# Patient Record
Sex: Female | Born: 1950 | Hispanic: Yes | Marital: Married | Smoking: Never smoker
Health system: Southern US, Community
[De-identification: ages and names within clinical notes are randomized; demographics above are authoritative.]

## PROBLEM LIST (undated history)

## (undated) DIAGNOSIS — F329 Major depressive disorder, single episode, unspecified: Secondary | ICD-10-CM

## (undated) DIAGNOSIS — F32A Depression, unspecified: Secondary | ICD-10-CM

## (undated) HISTORY — PX: ABDOMINAL HYSTERECTOMY: SHX81

## (undated) HISTORY — DX: Depression, unspecified: F32.A

## (undated) HISTORY — PX: THYROIDECTOMY: SHX17

## (undated) HISTORY — DX: Major depressive disorder, single episode, unspecified: F32.9

## (undated) HISTORY — PX: APPENDECTOMY: SHX54

---

## 2011-05-09 ENCOUNTER — Emergency Department (HOSPITAL_COMMUNITY): Payer: BC Managed Care – PPO

## 2011-05-09 ENCOUNTER — Emergency Department (HOSPITAL_COMMUNITY)
Admission: EM | Admit: 2011-05-09 | Discharge: 2011-05-10 | Disposition: A | Discharge status: Home / Self Care | Payer: BC Managed Care – PPO | Attending: Emergency Medicine | Admitting: Emergency Medicine

## 2011-05-09 DIAGNOSIS — I10 Essential (primary) hypertension: Secondary | ICD-10-CM | POA: Insufficient documentation

## 2011-05-09 DIAGNOSIS — R079 Chest pain, unspecified: Secondary | ICD-10-CM | POA: Insufficient documentation

## 2011-05-09 DIAGNOSIS — E119 Type 2 diabetes mellitus without complications: Secondary | ICD-10-CM | POA: Insufficient documentation

## 2011-05-09 DIAGNOSIS — R112 Nausea with vomiting, unspecified: Secondary | ICD-10-CM | POA: Insufficient documentation

## 2011-05-09 DIAGNOSIS — R109 Unspecified abdominal pain: Secondary | ICD-10-CM | POA: Insufficient documentation

## 2011-05-09 LAB — CBC
HCT: 44.2 % (ref 36.0–46.0)
Hemoglobin: 15.4 g/dL — ABNORMAL HIGH (ref 12.0–15.0)
MCV: 89.3 fL (ref 78.0–100.0)
RDW: 12.4 % (ref 11.5–15.5)
WBC: 14.3 10*3/uL — ABNORMAL HIGH (ref 4.0–10.5)

## 2011-05-09 LAB — DIFFERENTIAL
Basophils Absolute: 0 10*3/uL (ref 0.0–0.1)
Basophils Relative: 0 % (ref 0–1)
Eosinophils Absolute: 0 10*3/uL (ref 0.0–0.7)
Eosinophils Relative: 0 % (ref 0–5)
Lymphocytes Relative: 3 % — ABNORMAL LOW (ref 12–46)
Lymphs Abs: 0.4 10*3/uL — ABNORMAL LOW (ref 0.7–4.0)
Monocytes Absolute: 0.5 10*3/uL (ref 0.1–1.0)
Monocytes Relative: 3 % (ref 3–12)
Neutro Abs: 13.5 10*3/uL — ABNORMAL HIGH (ref 1.7–7.7)
Neutrophils Relative %: 94 % — ABNORMAL HIGH (ref 43–77)

## 2011-05-09 IMAGING — CR DG CHEST 2V
2 series · 2 of 2 positions shown · non-contrast
Comparison: None.

CLINICAL DATA: Chest pain.  History of diabetes.

CHEST - 2 VIEW

[w chest pa]
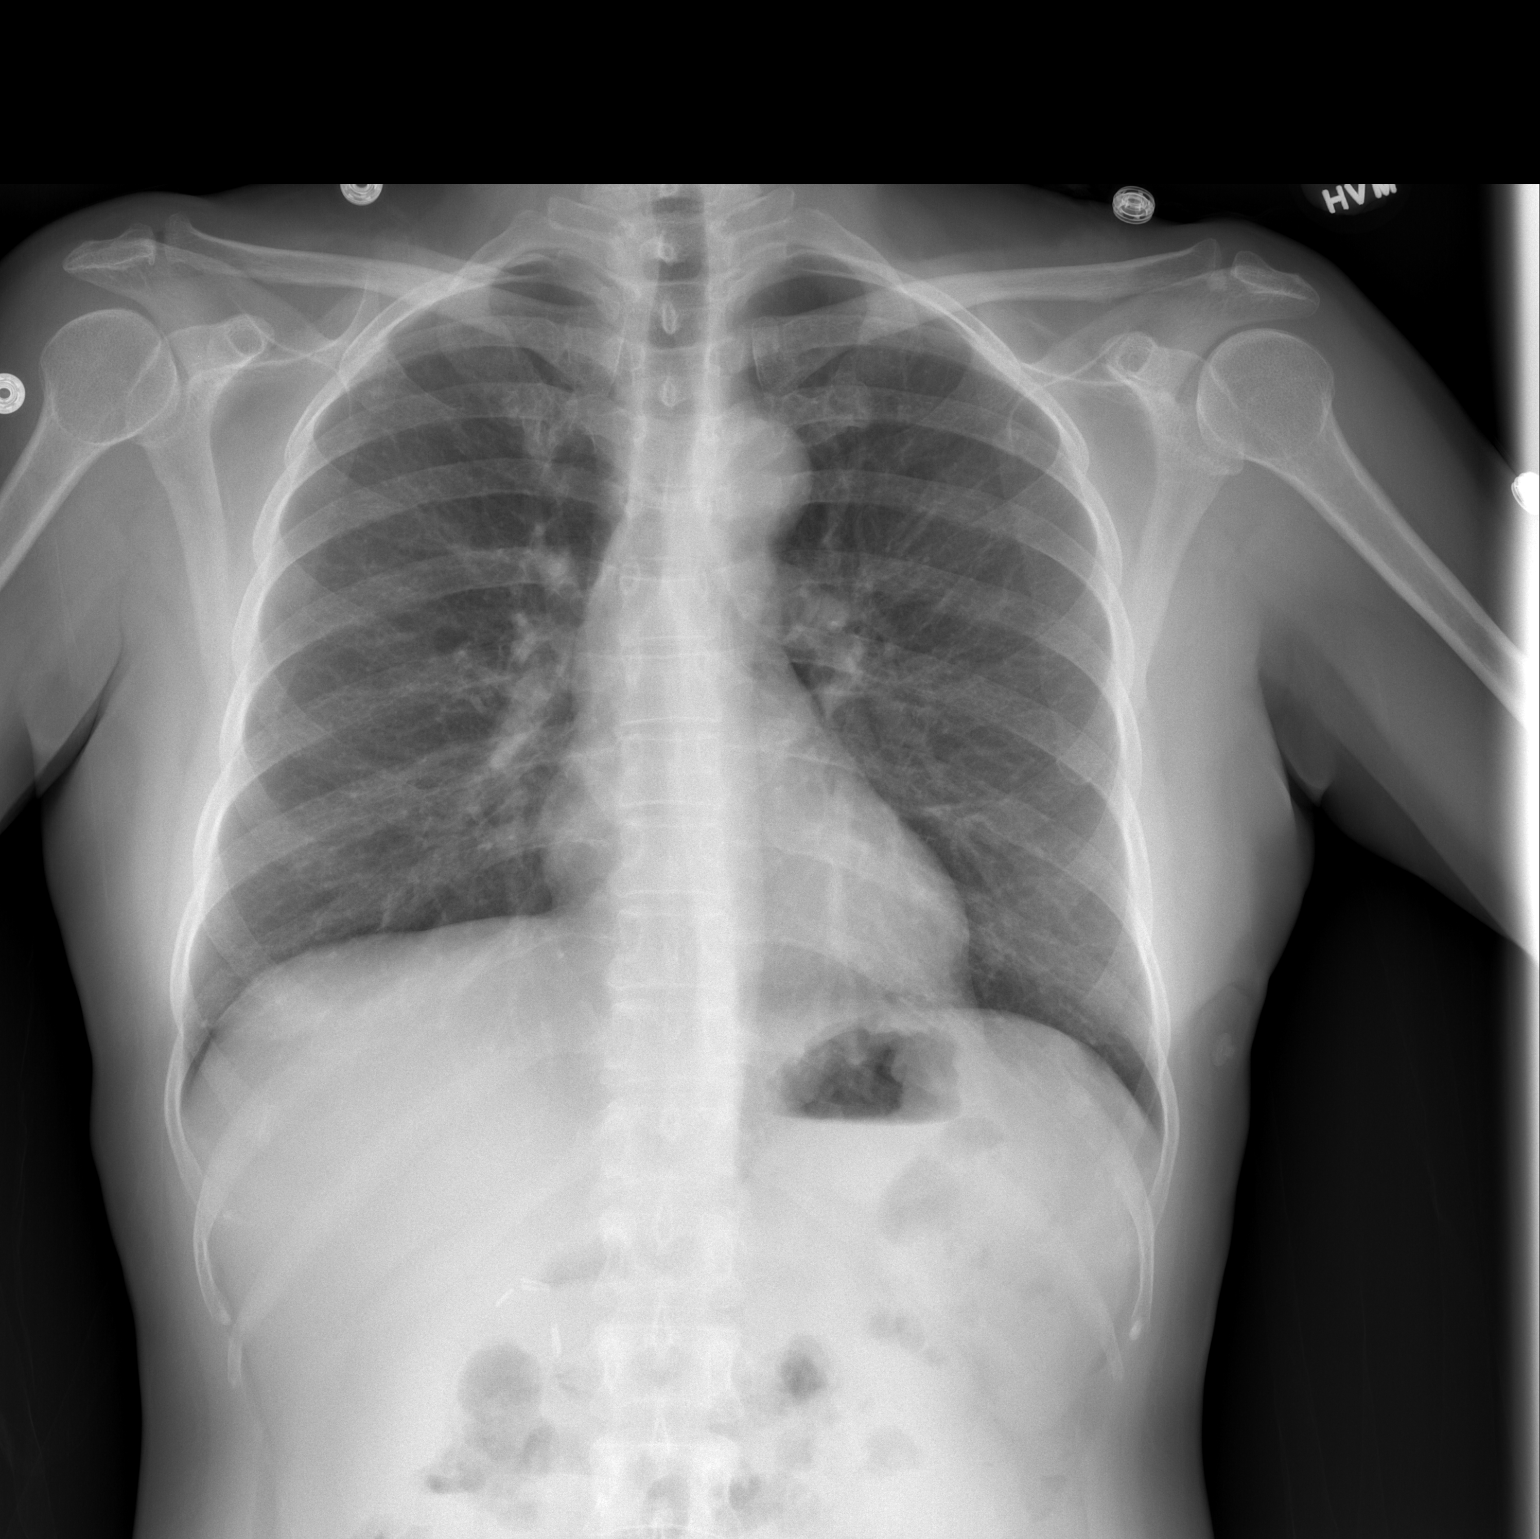

[w chest lat]
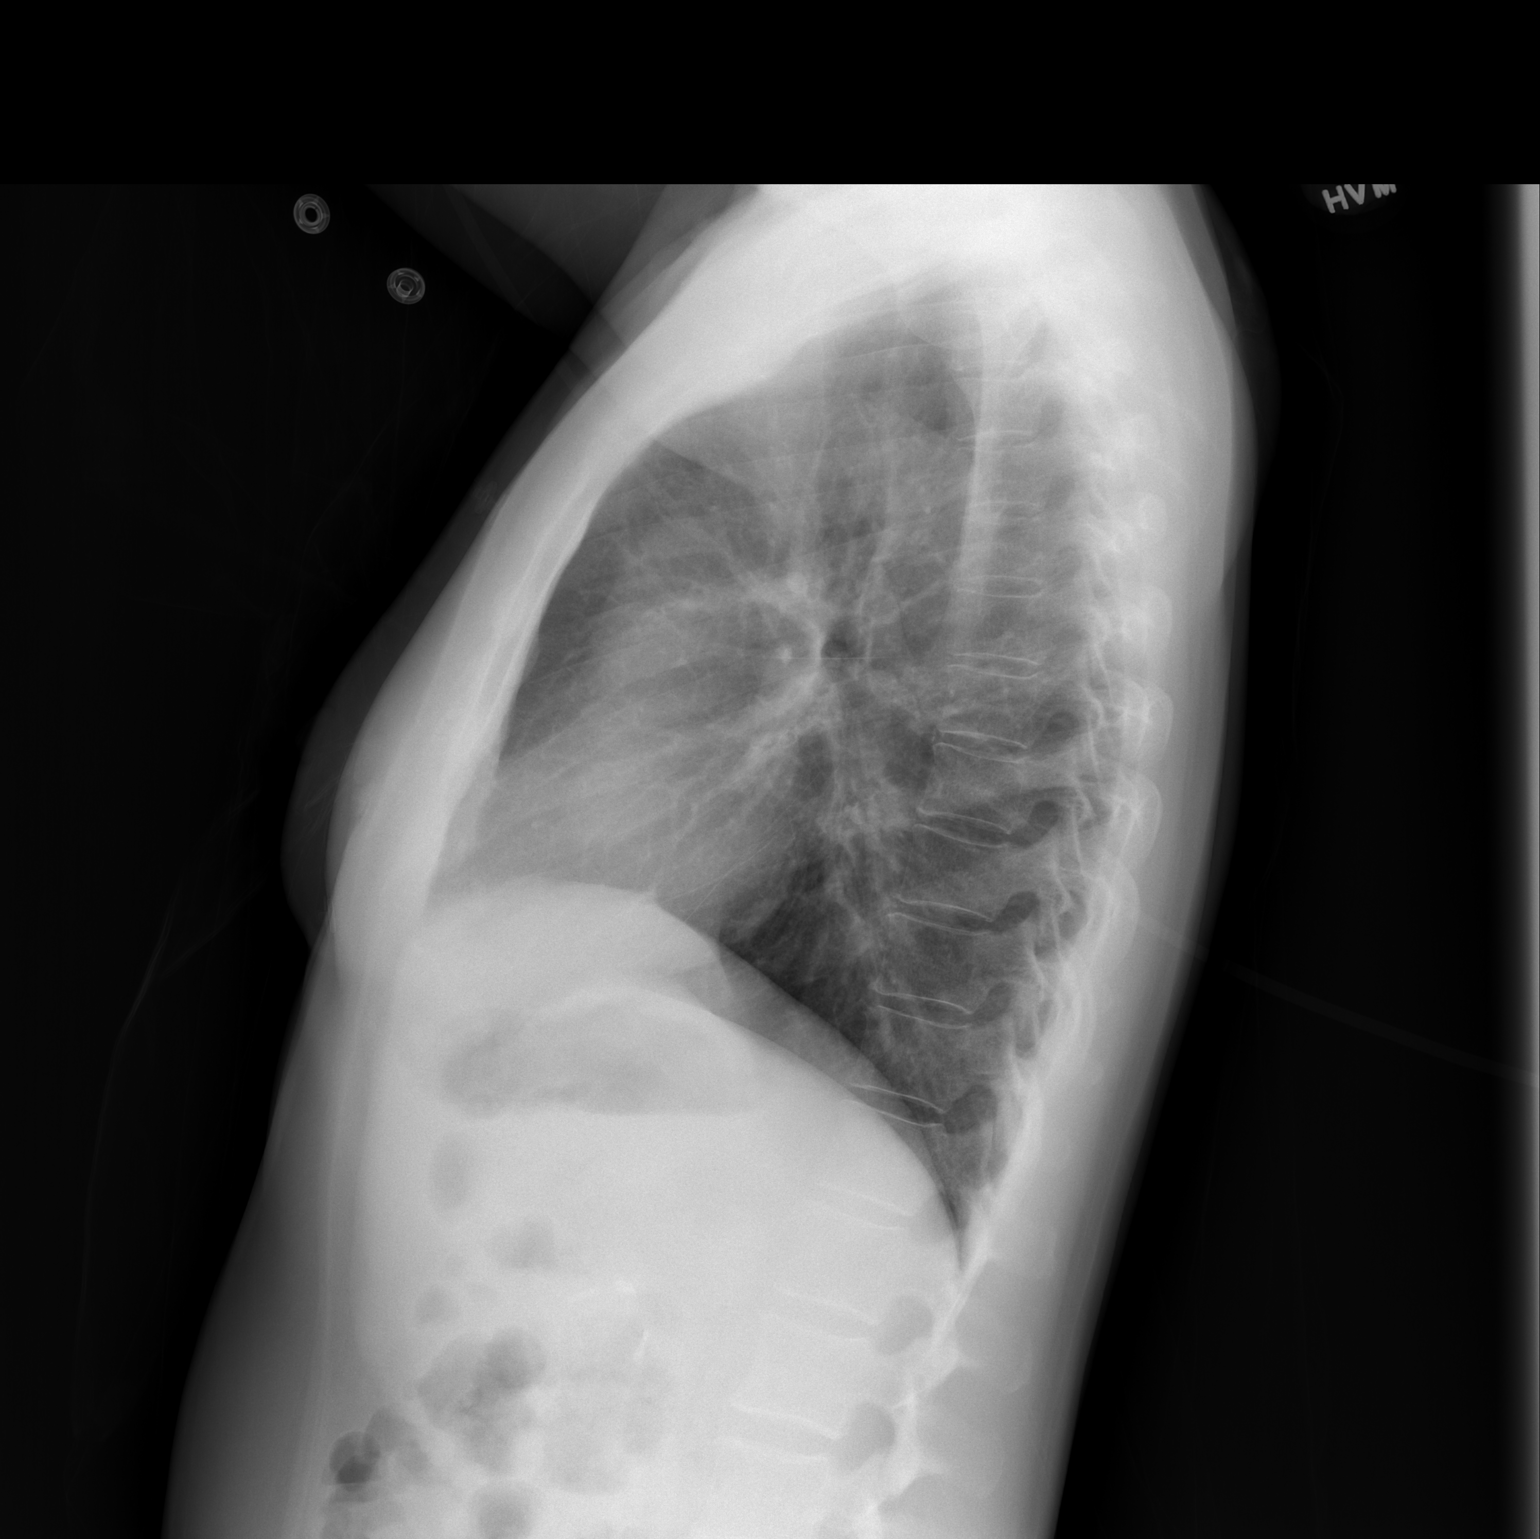

[2 of 2 positions shown; findings below may reference images not displayed]

FINDINGS: The lungs are well-aerated and clear.  There is no
evidence of focal opacification, pleural effusion or pneumothorax.

The heart is normal in size; the mediastinal contour is within
normal limits.  No acute osseous abnormalities are seen.  Clips are
noted within the right upper quadrant, reflecting prior
cholecystectomy.
IMPRESSION: No acute cardiopulmonary process seen.

## 2011-05-10 ENCOUNTER — Emergency Department (HOSPITAL_COMMUNITY): Payer: BC Managed Care – PPO

## 2011-05-10 LAB — COMPREHENSIVE METABOLIC PANEL
ALT: 18 U/L (ref 0–35)
AST: 19 U/L (ref 0–37)
Albumin: 4.3 g/dL (ref 3.5–5.2)
Alkaline Phosphatase: 94 U/L (ref 39–117)
Calcium: 9 mg/dL (ref 8.4–10.5)
GFR calc Af Amer: >60 mL/min (ref >60)
Glucose, Bld: 225 mg/dL — ABNORMAL HIGH (ref 70–99)
Potassium: 3.8 mEq/L (ref 3.5–5.1)
Sodium: 135 mEq/L (ref 135–145)
Total Protein: 7.8 g/dL (ref 6.0–8.3)

## 2011-05-10 LAB — URINALYSIS, ROUTINE W REFLEX MICROSCOPIC
Bilirubin Urine: NEGATIVE
Glucose, UA: NEGATIVE mg/dL
Hgb urine dipstick: NEGATIVE
Specific Gravity, Urine: 1.026 (ref 1.005–1.030)
Urobilinogen, UA: 1 mg/dL (ref 0.0–1.0)
pH: 6.5 (ref 5.0–8.0)

## 2011-05-10 LAB — CK TOTAL AND CKMB (NOT AT ARMC)
CK, MB: 1.3 ng/mL (ref 0.3–4.0)
Total CK: 76 U/L (ref 7–177)

## 2011-05-10 IMAGING — CT CT ABD-PELV W/ CM
3 of 6 series · 11 of 46 positions shown, 18 images · IV contrast (APPLIED)
Comparison: None.

CLINICAL DATA: Epigastric abdominal pain, nausea and bilious
vomiting.  Leukocytosis.

CT ABDOMEN AND PELVIS WITH CONTRAST
TECHNIQUE: Multidetector CT imaging of the abdomen and pelvis was
performed following the standard protocol during bolus
administration of intravenous contrast.
Contrast: 100 mL of Omnipaque 300 IV contrast

[Series 2: abd_pel 5.0 b40f st · axial · 0.66mm/px · z∈[-413,-73]mm · 7 of 92 slices shown, 12 images]
[im 12/92  soft-tissue]
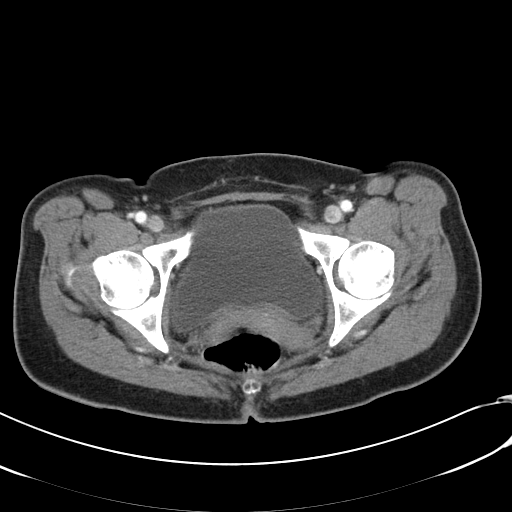
[im 12/92  bone]
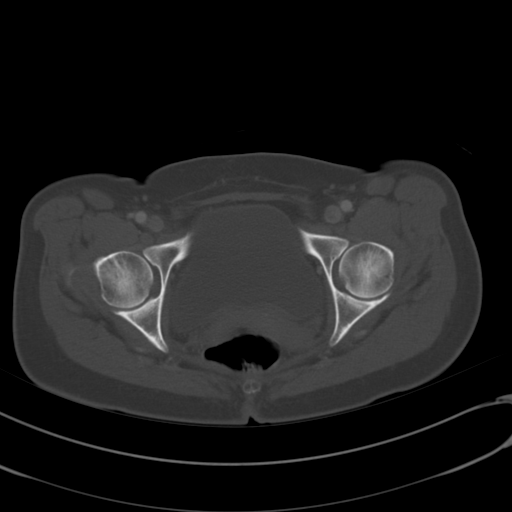
[im 23/92  soft-tissue]
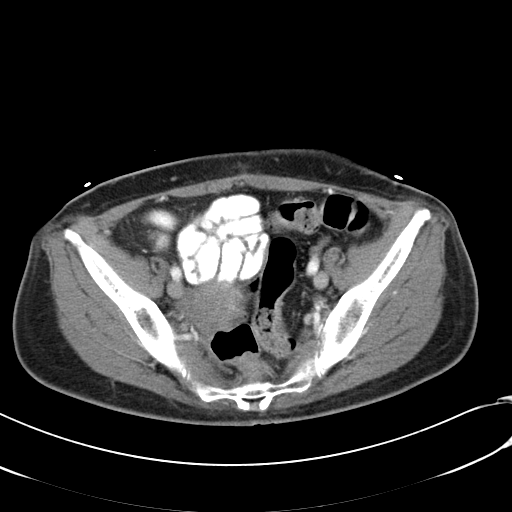
[im 35/92  soft-tissue]
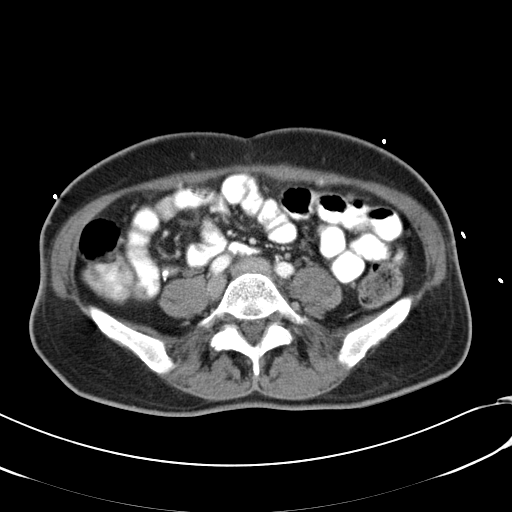
[im 46/92  soft-tissue]
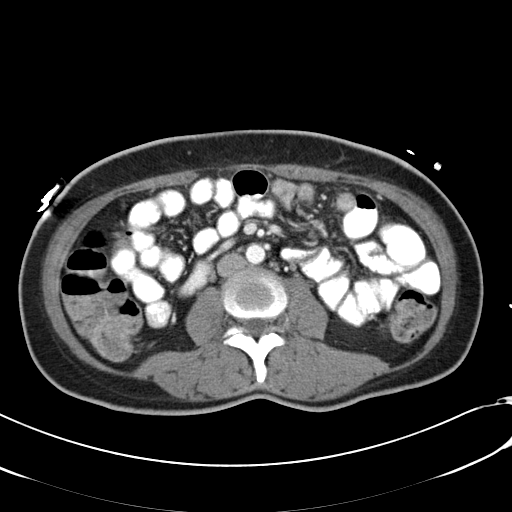
[im 46/92  lung]
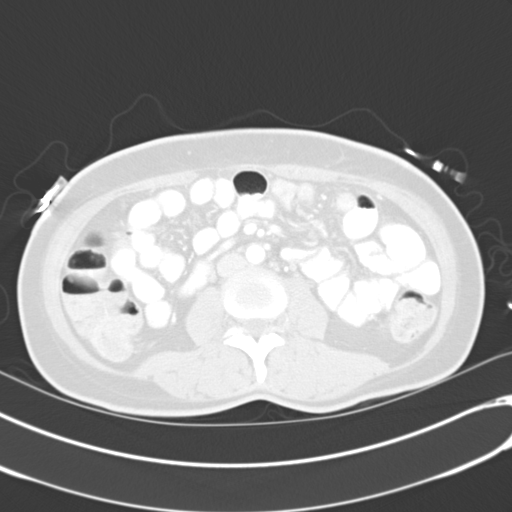
[im 57/92  soft-tissue]
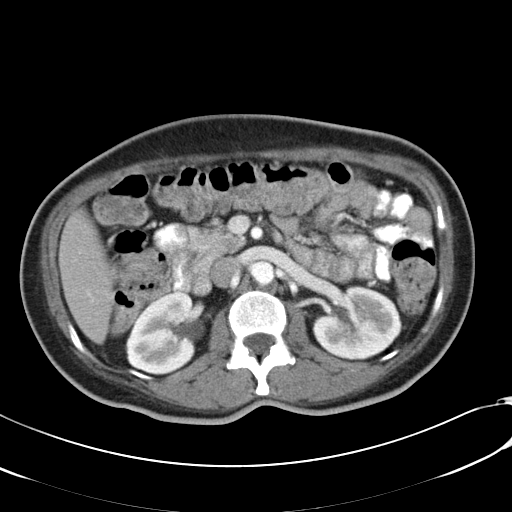
[im 57/92  lung]
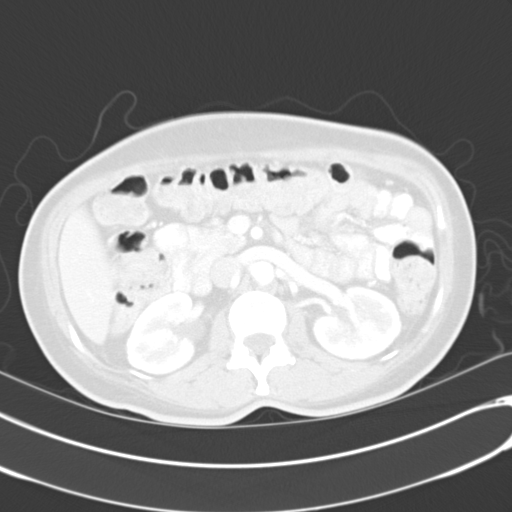
[im 69/92  soft-tissue]
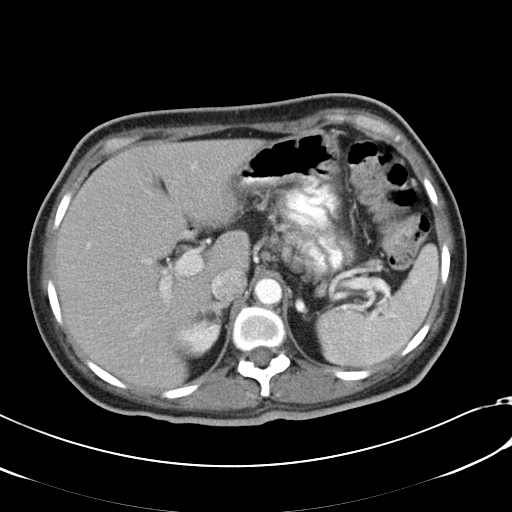
[im 69/92  lung]
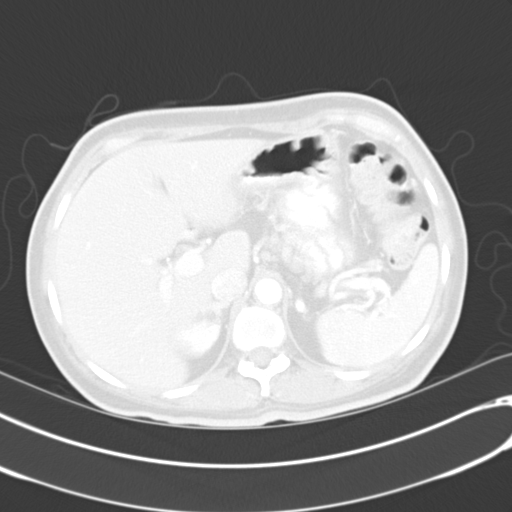
[im 80/92  soft-tissue]
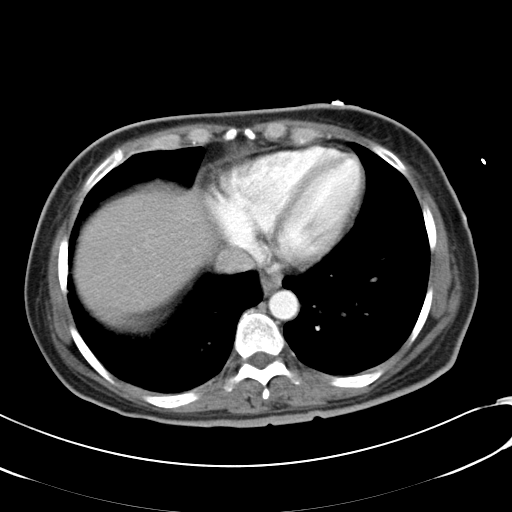
[im 80/92  lung]
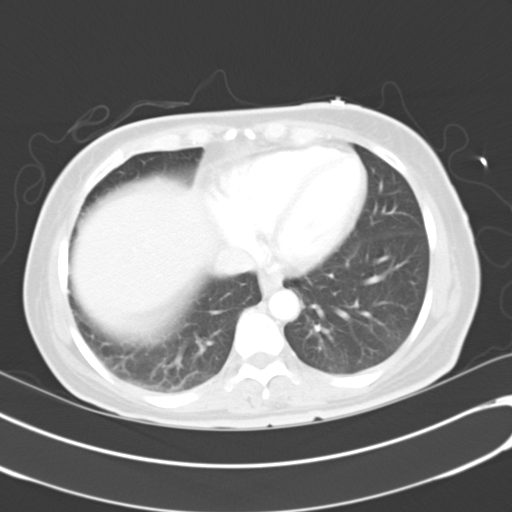

[Series 602: coronal abdomen · coronal · 0.93mm/px · 3 of 106 slices shown, 4 images]
[im 36/106  soft-tissue]
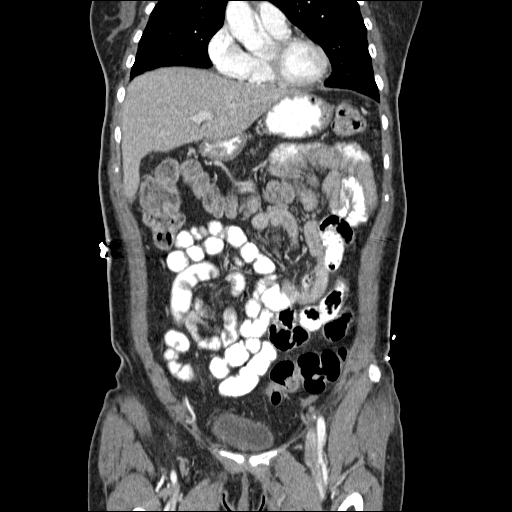
[im 47/106  soft-tissue]
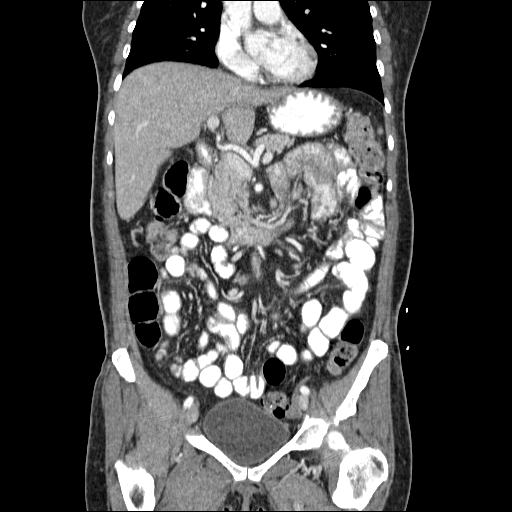
[im 47/106  bone]
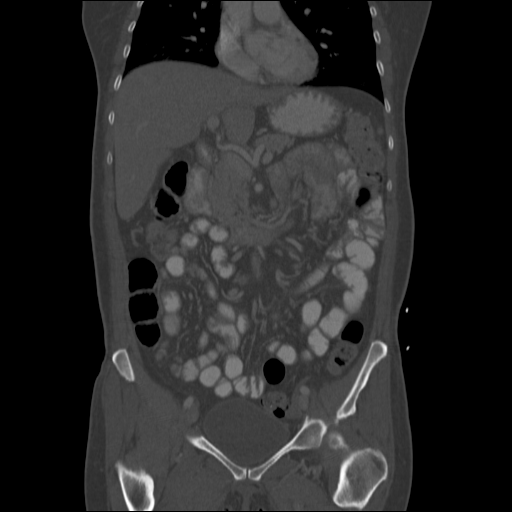
[im 59/106  soft-tissue]
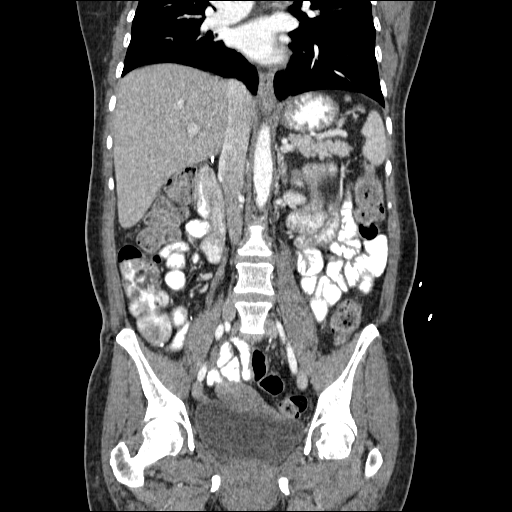

[Series 603: sagittal abdomen · sagittal · 0.93mm/px · 1 of 148 slices shown, 2 images]
[im 50/148  soft-tissue]
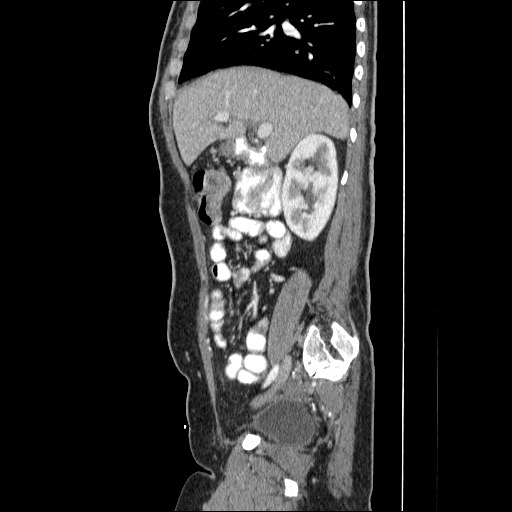
[im 50/148  bone]
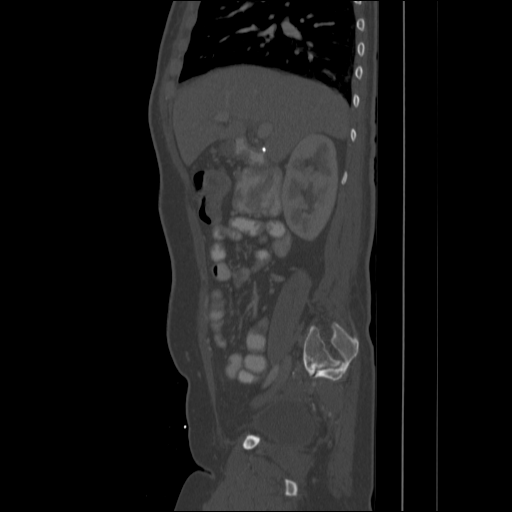

[11 of 46 positions shown; findings below may reference images not displayed]

FINDINGS: Minimal right basilar atelectasis is noted.

Minimal prominence of the hepatic biliary ducts is within normal
limits status post cholecystectomy.  Clips are noted along the
gallbladder fossa.  The liver is unremarkable in appearance.  The
spleen is within normal limits.

The pancreas and adrenal glands are unremarkable.  The kidneys are
within normal limits bilaterally; no hydronephrosis or perinephric
stranding is seen.

No free fluid is identified.  The small bowel is unremarkable in
appearance.  The stomach is partially filled with contrast and is
within normal limits.  No acute vascular abnormalities are seen.

The patient is status post appendectomy, with associated
postoperative change.  The colon is largely filled with stool and
is unremarkable in appearance.

The bladder is moderately distended and appears grossly
unremarkable.  The uterus is within normal limits.  The ovaries are
difficult to fully characterize; no suspicious adnexal masses are
seen.  No inguinal lymphadenopathy is seen.

No acute osseous abnormalities are identified.
IMPRESSION: No acute abnormalities identified within the abdomen or pelvis.

## 2011-05-10 MED ORDER — IOHEXOL 300 MG/ML  SOLN
100.0000 mL | Freq: Once | INTRAMUSCULAR | Status: AC | PRN
  Administered 2011-05-10: 100 mL via INTRAVENOUS

## 2012-01-29 DIAGNOSIS — E119 Type 2 diabetes mellitus without complications: Secondary | ICD-10-CM | POA: Insufficient documentation

## 2013-06-02 DIAGNOSIS — I10 Essential (primary) hypertension: Secondary | ICD-10-CM | POA: Insufficient documentation

## 2013-06-12 ENCOUNTER — Other Ambulatory Visit (HOSPITAL_COMMUNITY): Payer: Self-pay | Admitting: *Deleted

## 2013-06-12 DIAGNOSIS — Z1231 Encounter for screening mammogram for malignant neoplasm of breast: Secondary | ICD-10-CM

## 2013-06-13 ENCOUNTER — Ambulatory Visit (HOSPITAL_COMMUNITY)
Admission: RE | Admit: 2013-06-13 | Discharge: 2013-06-13 | Disposition: A | Discharge status: Home / Self Care | Payer: BC Managed Care – PPO | Source: Ambulatory Visit | Attending: Hospitalist | Admitting: Hospitalist

## 2013-06-13 DIAGNOSIS — Z1231 Encounter for screening mammogram for malignant neoplasm of breast: Secondary | ICD-10-CM | POA: Insufficient documentation

## 2014-03-30 ENCOUNTER — Ambulatory Visit: Payer: No Typology Code available for payment source | Attending: Family Medicine

## 2014-03-30 DIAGNOSIS — R293 Abnormal posture: Secondary | ICD-10-CM | POA: Insufficient documentation

## 2014-03-30 DIAGNOSIS — M25519 Pain in unspecified shoulder: Secondary | ICD-10-CM | POA: Insufficient documentation

## 2014-03-30 DIAGNOSIS — M25619 Stiffness of unspecified shoulder, not elsewhere classified: Secondary | ICD-10-CM | POA: Insufficient documentation

## 2014-03-30 DIAGNOSIS — IMO0001 Reserved for inherently not codable concepts without codable children: Secondary | ICD-10-CM | POA: Insufficient documentation

## 2014-04-02 ENCOUNTER — Ambulatory Visit: Payer: No Typology Code available for payment source

## 2014-04-05 ENCOUNTER — Ambulatory Visit: Payer: No Typology Code available for payment source | Admitting: Physical Therapy

## 2014-04-09 ENCOUNTER — Encounter: Payer: No Typology Code available for payment source | Admitting: Physical Therapy

## 2014-04-11 ENCOUNTER — Other Ambulatory Visit (HOSPITAL_COMMUNITY): Payer: Self-pay | Admitting: Family Medicine

## 2014-04-11 ENCOUNTER — Other Ambulatory Visit (HOSPITAL_COMMUNITY): Payer: Self-pay | Admitting: Hospitalist

## 2014-04-11 DIAGNOSIS — Z1231 Encounter for screening mammogram for malignant neoplasm of breast: Secondary | ICD-10-CM

## 2014-04-12 ENCOUNTER — Encounter: Payer: No Typology Code available for payment source | Admitting: Physical Therapy

## 2014-06-14 ENCOUNTER — Ambulatory Visit (HOSPITAL_COMMUNITY): Payer: No Typology Code available for payment source | Attending: Family Medicine

## 2014-10-11 ENCOUNTER — Emergency Department (HOSPITAL_COMMUNITY)
Admission: EM | Admit: 2014-10-11 | Discharge: 2014-10-12 | Disposition: A | Discharge status: Home / Self Care | Payer: No Typology Code available for payment source | Attending: Emergency Medicine | Admitting: Emergency Medicine

## 2014-10-11 ENCOUNTER — Encounter (HOSPITAL_COMMUNITY): Payer: Self-pay | Admitting: Emergency Medicine

## 2014-10-11 DIAGNOSIS — Z79899 Other long term (current) drug therapy: Secondary | ICD-10-CM | POA: Insufficient documentation

## 2014-10-11 DIAGNOSIS — Z794 Long term (current) use of insulin: Secondary | ICD-10-CM | POA: Insufficient documentation

## 2014-10-11 DIAGNOSIS — M542 Cervicalgia: Secondary | ICD-10-CM | POA: Diagnosis present

## 2014-10-11 DIAGNOSIS — M62838 Other muscle spasm: Secondary | ICD-10-CM

## 2014-10-11 DIAGNOSIS — F419 Anxiety disorder, unspecified: Secondary | ICD-10-CM | POA: Insufficient documentation

## 2014-10-11 MED ORDER — DIAZEPAM 5 MG PO TABS
5.0000 mg | ORAL_TABLET | Freq: Once | ORAL | Status: AC
  Administered 2014-10-11: 5 mg via ORAL
  Filled 2014-10-11: qty 1

## 2014-10-11 MED ORDER — TRAMADOL HCL 50 MG PO TABS
50.0000 mg | ORAL_TABLET | Freq: Once | ORAL | Status: AC
  Administered 2014-10-11: 50 mg via ORAL
  Filled 2014-10-11: qty 1

## 2014-10-11 MED ORDER — KETOROLAC TROMETHAMINE 60 MG/2ML IM SOLN
60.0000 mg | Freq: Once | INTRAMUSCULAR | Status: AC
  Administered 2014-10-11: 60 mg via INTRAMUSCULAR
  Filled 2014-10-11: qty 2

## 2014-10-11 NOTE — ED Provider Notes (Signed)
CSN: [id number]     Arrival date & time 10/11/14  1953 History   First MD Initiated Contact with Patient 10/11/14 2304     Chief Complaint  Patient presents with  . Neck Pain     (Consider location/radiation/quality/duration/timing/severity/associated sxs/prior Treatment) HPI Patient presents with gradual onset left-sided neck and trapezius pain. Pain started around noon. No known trauma. The pain is gradual onset. Described as sharp. Worse with movement. Please of similar symptoms that resolved in several days. No fever or chills. No focal weakness or numbness. No loss of bladder or bowel control. History reviewed. No pertinent past medical history. Past Surgical History  Procedure Laterality Date  . Appendectomy    . Abdominal hysterectomy    . Thyroidectomy     No family history on file. History  Substance Use Topics  . Smoking status: Not on file  . Smokeless tobacco: Not on file  . Alcohol Use: Not on file   OB History    No data available     Review of Systems  Constitutional: Negative for fever and chills.  Respiratory: Negative for shortness of breath.   Cardiovascular: Negative for chest pain.  Gastrointestinal: Negative for nausea, vomiting and abdominal pain.  Musculoskeletal: Positive for myalgias, neck pain and neck stiffness. Negative for back pain.  Skin: Negative for pallor, rash and wound.  Neurological: Negative for dizziness, weakness, light-headedness, numbness and headaches.  All other systems reviewed and are negative.     Allergies  Review of patient's allergies indicates no known allergies.  Home Medications   Prior to Admission medications   Medication Sig Start Date End Date Taking? Authorizing Provider  glimepiride (AMARYL) 4 MG tablet Take 4 mg by mouth 2 (two) times daily.   Yes Historical Provider, MD  ibuprofen (ADVIL,MOTRIN) 800 MG tablet Take 800 mg by mouth every 8 (eight) hours as needed for mild pain.   Yes Historical Provider, MD   insulin glargine (LANTUS) 100 UNIT/ML injection Inject 10 Units into the skin daily.   Yes Historical Provider, MD  levothyroxine (SYNTHROID, LEVOTHROID) 25 MCG tablet Take 25 mcg by mouth daily before breakfast.   Yes Historical Provider, MD  metFORMIN (GLUCOPHAGE) 1000 MG tablet Take 1,000 mg by mouth 2 (two) times daily with a meal.   Yes Historical Provider, MD   BP 127/73 mmHg  Pulse 100  Temp(Src) 98.8 F (37.1 C) (Oral)  Resp 18  SpO2 99% Physical Exam  Constitutional: She is oriented to person, place, and time. She appears well-developed and well-nourished. No distress.  HENT:  Head: Normocephalic and atraumatic.  Mouth/Throat: Oropharynx is clear and moist. No oropharyngeal exudate.  Eyes: EOM are normal. Pupils are equal, round, and reactive to light.  Neck: Normal range of motion. Neck supple.  Very tender to palpation over the left trapezius and left paraspinal muscles. Spasm appreciated. Pain with range of motion especially turning head to the left. No definite nuchal rigidity. No midline tenderness to palpation. No obvious trauma.  Cardiovascular: Normal rate and regular rhythm.   Pulmonary/Chest: Effort normal and breath sounds normal. No respiratory distress. She has no wheezes. She has no rales.  Abdominal: Soft. Bowel sounds are normal. She exhibits no distension and no mass. There is no tenderness. There is no rebound and no guarding.  Musculoskeletal: Normal range of motion. She exhibits no edema or tenderness.  No thoracic or lumbar tenderness with palpation. Distal pulses intact.  Neurological: She is alert and oriented to person, place, and time.  5/5 motor in all extremities. Sensation is intact.  Skin: Skin is warm and dry. No rash noted. No erythema.  Psychiatric: Her behavior is normal.  Mild anxiety  Nursing note and vitals reviewed.   ED Course  Procedures (including critical care time) Labs Review Labs Reviewed - No data to display  Imaging  Review No results found.   EKG Interpretation None      MDM   Final diagnoses:  None    Exam is consistent with muscle spasm of the trapezius and cervical muscles. No definite meningismus. Patient has a normal neurologic exam. I do not believe that imaging is indicated at this point. We will treat symptomatically and reassess.  Patient states she is feeling much better. No meningismus. Normal neurologic exam. Return precautions given.  Loren Raceravid Raychel Dowler, MD 10/12/14 [phone]

## 2014-10-11 NOTE — ED Notes (Signed)
EDP at bedside  

## 2014-10-11 NOTE — ED Notes (Signed)
Pt Spanish speaking only - translator used for triage.  Pt reports sudden on set of neck pain around 12 today, admits to tingling sensation in head.  Denies injury or changes in vision- neuro exam negative- pt alert and oriented X 4.  Ambulatory in triage without difficulty, denies numbness in extremities, denies loss of control of bowel or bladder.

## 2014-10-12 MED ORDER — TRAMADOL HCL 50 MG PO TABS: 50.0000 mg | ORAL_TABLET | Freq: Four times a day (QID) | ORAL | Status: DC | PRN

## 2014-10-12 MED ORDER — IBUPROFEN 600 MG PO TABS: 600.0000 mg | ORAL_TABLET | Freq: Three times a day (TID) | ORAL | Status: DC | PRN

## 2014-10-12 MED ORDER — DIAZEPAM 5 MG PO TABS: 5.0000 mg | ORAL_TABLET | Freq: Four times a day (QID) | ORAL | Status: DC | PRN

## 2014-10-12 NOTE — Discharge Instructions (Signed)
Calambres y espasmos musculares  (Muscle Cramps and Spasms)  Los calambres musculares y espasmos ocurren cuando un msculo o grupos de msculos se tensan y no se tiene control sobre esta tensin (contraccin muscular involuntaria). Es un problema comn y Software engineerpuede aparecer en cualquier msculo. La zona ms comn son los msculos de la pantorrilla. Tanto los Liberty Globalcalambres como los espasmos son contracciones musculares involuntarias, pero tambin tienen diferencias:   Los calambres musculares son espordicos y Engineer, miningdolorosos. Pueden durar entre algunos segundos hasta un cuarto de [place]hora. Los calambres musculares son ms fuertes y duran ms que los espasmos musculares.  Los espasmos pueden o no ser dolorosos. Pueden durar algunos segundos o mucho ms. CAUSAS  No es frecuente que los calambres se deban a un trastorno subyacente grave. En muchos casos, la causa de los calambres y los espasmos es desconocida. Algunas causas frecuentes son:   Esfuerzo excesivo.   El uso excesivo del msculo por movimientos repetitivos (hacer lo mismo una y Laverda Pageotra vez).   Permanecer en cierta posicin durante un largo perodo de Whelen Springstiempo.   Preparacin, forma o tcnica inadecuada al realizar un deporte o [place]actividad.   Deshidratacin.   Traumatismos.   Efectos secundarios de algunos medicamentos.  Niveles anormalmente bajos de las sales e iones en la sangre (electrolitos), especialmente el potasio y el calcio. Pueden ocurrir cuando se toman pldoras para Geographical information systems officerorinar (diurticos) o en las mujeres embarazadas.  Algunos problemas mdicos subyacentes pueden hacer que sea ms propenso a desarrollar calambres o espasmos. Estos incluyen, pero no se limitan a:   Diabetes.   Enfermedad de Parkinson.   Trastornos hormonales, tales como problemas de la tiroides.   El consumo excesivo de alcohol.   Enfermedades especficas de Harrah's Entertainmentlos msculos, las articulaciones y [place]los huesos.   Enfermedad vascular en la que no llega suficiente sangre  a los msculos.  INSTRUCCIONES PARA EL CUIDADO EN EL HOGAR   Mantngase bien hidratado. Beba gran cantidad de lquido para mantener la orina de tono claro o color amarillo plido.  Puede ser til Engineer, maintenance (IT)masajear, Therapist, musicelongar y International aid/development workerrelajar el msculo afectado.  Para los msculos tensos o apretados, use una toalla caliente, una almohadilla trmica o agua caliente de la ducha dirigida a la zona afectada.  Si est dolorido o siente dolor despus de un calambre o espasmo, aplique hielo en el rea afectada para Acupuncturistaliviar el malestar.  Ponga el hielo en una bolsa plstica.  Colquese una toalla entre la piel y la bolsa de hielo.  Deje el hielo en el lugar durante 15 a 20 minutos, 3 a 4 veces por da.  Los medicamentos que se utilizan para tratar las causas conocidas de los calambres o espasmos pueden reducir su frecuencia o gravedad. Tome slo medicamentos de venta libre o recetados, segn las indicaciones del mdico. SOLICITE ATENCIN MDICA SI:  Los calambres o espasmos empeoran, ocurren con ms frecuencia o no mejoran con Museum/gallery conservatorel tiempo.  ASEGRESE DE QUE:   Comprende estas instrucciones.  Controlar su enfermedad.  Solicitar ayuda de inmediato si no mejora o si empeora. Document Released: 09/02/2005 Document Revised: 03/20/2013 [facility] Patient Information 2015 [place]ExitCare, [place]LL[place]. This information is not intended to replace advice given to you by your health care provider. Make sure you discuss any questions you have with your health care provider.

## 2015-10-22 ENCOUNTER — Ambulatory Visit: Payer: Medicare Other | Attending: Family Medicine | Admitting: Physical Therapy

## 2015-10-22 DIAGNOSIS — M25511 Pain in right shoulder: Secondary | ICD-10-CM | POA: Diagnosis present

## 2015-10-22 DIAGNOSIS — R209 Unspecified disturbances of skin sensation: Secondary | ICD-10-CM

## 2015-10-22 DIAGNOSIS — R208 Other disturbances of skin sensation: Secondary | ICD-10-CM | POA: Diagnosis present

## 2015-10-22 DIAGNOSIS — R29898 Other symptoms and signs involving the musculoskeletal system: Secondary | ICD-10-CM | POA: Insufficient documentation

## 2015-10-22 DIAGNOSIS — M25512 Pain in left shoulder: Secondary | ICD-10-CM | POA: Diagnosis present

## 2015-10-22 DIAGNOSIS — M542 Cervicalgia: Secondary | ICD-10-CM | POA: Insufficient documentation

## 2015-10-22 NOTE — Therapy (Addendum)
[place], [place], [address] Phone: [phone]   Fax:  [phone]  Physical Therapy Evaluation  Patient Details  Name: Haley Bartlett MRN: [id number] Date of Birth: 1951/09/08 Referring Provider: Drema Dallas  Encounter Date: 10/22/2015      PT End of Session - 10/22/15 1235    Visit Number 1   Number of Visits 8   Date for PT Re-Evaluation 12/17/15   PT Start Time 1130   PT Stop Time 1228   PT Time Calculation (min) 58 min   Activity Tolerance Patient tolerated treatment well;Patient limited by pain   Behavior During Therapy [facility] for tasks assessed/performed      No past medical history on file.  Past Surgical History  Procedure Laterality Date  . Appendectomy    . Abdominal hysterectomy    . Thyroidectomy      There were no vitals filed for this visit.  Visit Diagnosis:  Pain in neck  Pain of both shoulder joints  Weakness of both arms  Sensory disturbance      Subjective Assessment - 10/22/15 1128    Subjective Patient has pain in bilateral shoulders R>L which has gone on for 3 yrs. She also has pain in  neck.  She reports tingling in Rt. UE, weakness bilateral, cramping in Rt. arm. She has difficulty at night with sleep positioning, housework, ADLs.     Patient is accompained by: Family member   Diagnostic tests none recent   Currently in Pain? Yes   Pain Score 8    Pain Location Arm   Pain Orientation Right;Left   Pain Descriptors / Indicators Spasm;Squeezing   Pain Type Chronic pain   Pain Radiating Towards neck    Pain Onset More than a month ago   Pain Frequency Constant   Aggravating Factors  using arms, reaching back    Pain Relieving Factors standing, walking    Effect of Pain on Daily Activities unable to work, disabled   Multiple Pain Sites No            [facility] PT Assessment - 10/22/15 1138    Assessment   Medical Diagnosis bilateral shoulder pain    Referring Provider Drema Dallas   Onset Date/Surgical Date --  3 yrs   Hand Dominance Right   Prior Therapy yes   Precautions   Precautions None   Restrictions   Weight Bearing Restrictions No   Balance Screen   Has the patient fallen in the past 6 months No   [place] residence   Prior Function   Level of Independence Independent with basic ADLs   Cognition   Overall Cognitive Status Within Functional Limits for tasks assessed   Observation/Other Assessments   Observations Grip Rt. 8.5, 6, 6, Lt. 6, 5, 5,kg    Focus on Therapeutic Outcomes (FOTO)  59%  goal 37%   Sensation   Light Touch Appears Intact   Coordination   Gross Motor Movements are Fluid and Coordinated Not tested   Posture/Postural Control   Posture/Postural Control Postural limitations   Postural Limitations Rounded Shoulders;Forward head   AROM   Right/Left Shoulder --  pain throughout   Right Shoulder Flexion 110 Degrees   Right Shoulder ABduction 105 Degrees   Left Shoulder Flexion 122 Degrees   Left Shoulder ABduction 108 Degrees   Cervical Flexion 38   Cervical Extension 26   Cervical - Right Side Bend 20   Cervical -  Left Side Bend 16   Cervical - Right Rotation 50   Cervical - Left Rotation 58   PROM   PROM Assessment Site --  full PROM    Strength   Right Shoulder Flexion 3-/5   Right Shoulder ABduction 3-/5   Right Shoulder Internal Rotation 4/5   Right Shoulder External Rotation 4/5   Left Shoulder Flexion 4-/5   Left Shoulder ABduction 3+/5   Left Shoulder Internal Rotation 4/5   Left Shoulder External Rotation 4/5   Right/Left Elbow Right;Left   Right Elbow Flexion 4/5   Right Elbow Extension 4/5   Left Elbow Flexion 4/5   Left Elbow Extension 4/5   Palpation   Spinal mobility no pain lateral glides   Palpation comment Pain bilat. upper traps, ant Rt. shoulder, along clavicle.  POst cervicals painful and hypertonic into upper cervical/scalp    Special Tests   Cervical Tests Spurling's;Dictraction;Vertebral Artery Test   Spurling's   Findings Negative   Side --  bilat   Comment pain in neck, not UEs   Distraction Test   Findngs Positive   side --  bilat. less pian in UEs with supine distraction   Vertebral Artery Test    Findings Negative   Side --  bilat.      MHP 15 min to Rt. UE and neck in supine      PT Education - 10/22/15 1234    Education provided Yes   Education Details PT/POC, arthritis, posture, bed mobility   Person(s) Educated Patient;Spouse   Methods Explanation;Demonstration;Handout   Comprehension Verbalized understanding;Returned demonstration          PT Short Term Goals - 10/22/15 1256    PT SHORT TERM GOAL #1   Title Pt will be I with initial HEP    Time 4   Period Weeks   Status New   PT SHORT TERM GOAL #2   Title Pt will report less pain at night (25% or more) to improve sleep and quality of life.    Time 4   Period Weeks   Status New   PT SHORT TERM GOAL #3   Title Pt will be able to raise arms to shoulder height with ease, min pain   Time 4   Period Weeks   Status New           PT Long Term Goals - 10/22/15 1302    PT LONG TERM GOAL #1   Title Pt will be I with concepts of posture, body mechanics and lifting to reduce pain and risk of re-injury.    Time 8   Period Weeks   Status New   PT LONG TERM GOAL #2   Title Pt will be able to reach overhead without increase in neck pain   Time 8   Period Weeks   Status New   PT LONG TERM GOAL #3   Title Pt will be able to score on FOTO 40% or less limited to show improved functional mobility.    Time 8   Period Weeks   Status New   PT LONG TERM GOAL #4   Title Pt will increase strength in UEs to 4/5 or more overall in flex/abd and posterior chain mm for improved posture and function.    Time 8   Status New   PT LONG TERM GOAL #5   Title Pt will be able to sit comfortably for 30 min for meals, rest.    Time 8  Period  Weeks   Status New               Plan - 10/22/15 1236    Clinical Impression Statement Patient reports with pain across upper back, neck and into both shoulder joints.  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  She does seem to have some cervical involvement but special tests neg.  She will benefit from added cervical treatment to fully address her pain and possibly an orthopedic specialist if conservative care is limited.    Pt will benefit from skilled therapeutic intervention in order to improve on the following deficits Decreased range of motion;Increased fascial restricitons;Increased muscle spasms;Impaired UE functional use;Pain;Decreased activity tolerance;Impaired flexibility;Improper body mechanics;Postural dysfunction;Decreased strength   Rehab Potential Good   PT Frequency 2x / week  finances may limit to 1 x per week    PT Duration 8 weeks   PT Treatment/Interventions ADLs/Self Care Home Management;Cryotherapy;Electrical Stimulation;Iontophoresis 41m/ml Dexamethasone;Moist Heat;Traction;Therapeutic exercise;Manual techniques;Therapeutic activities;Taping;Functional mobility training;Dry needling;Passive range of motion;Neuromuscular re-education   PT Next Visit Plan establish HEP, modalities, heat, AROM UEs in supine/C AROM   PT Home Exercise Plan none today, gave posture   Recommended Other Services Orthopedic   Consulted and Agree with Plan of Care Patient     FOTO 59% GCODE CATEGORY CARRYING OBJECTS CK CJ    Problem List There are no active problems to display for this patient.   PAA,JENNIFER 10/22/2015, 1:26 PM  [facility]GPittman NAl[place] [address]Phone: [phone]  Fax:  [phone] Name: BShizuko WojdylaMRN: [id number] of Birth: 401/09/1951  JRaeford Razor PT 10/22/2015 1:27 PM Phone: [phone]:  [phone]    PHYSICAL THERAPY DISCHARGE SUMMARY  Visits from Start of Care: 1  Current functional level related to goals / functional outcomes: Unknown     Remaining deficits: Unknown   Education / Equipment: posture, POC  Plan: Patient agrees to discharge.  Patient goals were not met. Patient is being discharged due to not returning since the last visit.  ?????    JRaeford Razor PT 03/05/2016 2:38 PM Phone: [phone]: [phone]

## 2015-10-22 NOTE — Patient Instructions (Signed)
Posture Tips DO: - stand tall and erect - keep chin tucked in - keep head and shoulders in alignment - check posture regularly in mirror or large window - pull head back against headrest in car seat;  Change your position often.  Sit with lumbar support. DON'T: - slouch or slump while watching TV or reading - sit, stand or lie in one position  for too long;  Sitting is especially hard on the spine so if you sit at a desk/use the computer, then stand up often!   Copyright  VHI. All rights reserved.  Posture - Standing   Good posture is important. Avoid slouching and forward head thrust. Maintain curve in low back and align ears over shoul- ders, hips over ankles.  Pull your belly button in toward your back bone.   Copyright  VHI. All rights reserved.  Posture - Sitting   Sit upright, head facing forward. Try using a roll to support lower back. Keep shoulders relaxed, and avoid rounded back. Keep hips level with knees. Avoid crossing legs for long periods.   Copyright  VHI. All rights reserved.    Posture Tips DO: - stand tall and erect - keep chin tucked in - keep head and shoulders in alignment - check posture regularly in mirror or large window - pull head back against headrest in car seat;  Change your position often.  Sit with lumbar support. DON'T: - slouch or slump while watching TV or reading - sit, stand or lie in one position  for too long;  Sitting is especially hard on the spine so if you sit at a desk/use the computer, then stand up often!   Copyright  VHI. All rights reserved.  Posture - Standing   Good posture is important. Avoid slouching and forward head thrust. Maintain curve in low back and align ears over shoul- ders, hips over ankles.  Pull your belly button in toward your back bone.   Copyright  VHI. All rights reserved.  Posture - Sitting   Sit upright, head facing forward. Try using a roll to support lower back. Keep shoulders relaxed, and avoid  rounded back. Keep hips level with knees. Avoid crossing legs for long periods.   Copyright  VHI. All rights reserved.   Sleeping on Back  Place pillow under knees. A pillow with cervical support and a roll around waist are also helpful. Copyright  VHI. All rights reserved.  Sleeping on Side Place pillow between knees. Use cervical support under neck and a roll around waist as needed. Copyright  VHI. All rights reserved.   Sleeping on Stomach   If this is the only desirable sleeping position, place pillow under lower legs, and under stomach or chest as needed.  Posture - Sitting   Sit upright, head facing forward. Try using a roll to support lower back. Keep shoulders relaxed, and avoid rounded back. Keep hips level with knees. Avoid crossing legs for long periods. Stand to Sit / Sit to Stand   To sit: Bend knees to lower self onto front edge of chair, then scoot back on seat. To stand: Reverse sequence by placing one foot forward, and scoot to front of seat. Use rocking motion to stand up.   Work Height and Reach  Ideal work height is no more than 2 to 4 inches below elbow level when standing, and at elbow level when sitting. Reaching should be limited to arm's length, with elbows slightly bent.  Bending  Bend at hips  and knees, not back. Keep feet shoulder-width apart.    Posture - Standing   Good posture is important. Avoid slouching and forward head thrust. Maintain curve in low back and align ears over shoul- ders, hips over ankles.  Alternating Positions   Alternate tasks and change positions frequently to reduce fatigue and muscle tension. Take rest breaks. Computer Work   Position work to Art gallery managerface forward. Use proper work and seat height. Keep shoulders back and down, wrists straight, and elbows at right angles. Use chair that provides full back support. Add footrest and lumbar roll as needed.  Getting Into / Out of Car  Lower self onto seat, scoot back, then  bring in one leg at a time. Reverse sequence to get out.  Dressing  Lie on back to pull socks or slacks over feet, or sit and bend leg while keeping back straight.    Housework - Sink  Place one foot on ledge of cabinet under sink when standing at sink for prolonged periods.   Pushing / Pulling  Pushing is preferable to pulling. Keep back in proper alignment, and use leg muscles to do the work.  Deep Squat   Squat and lift with both arms held against upper trunk. Tighten stomach muscles without holding breath. Use smooth movements to avoid jerking.  Avoid Twisting   Avoid twisting or bending back. Pivot around using foot movements, and bend at knees if needed when reaching for articles.  Carrying Luggage   Distribute weight evenly on both sides. Use a cart whenever possible. Do not twist trunk. Move body as a unit.   Lifting Principles .Maintain proper posture and head alignment. .Slide object as close as possible before lifting. .Move obstacles out of the way. .Test before lifting; ask for help if too heavy. .Tighten stomach muscles without holding breath. .Use smooth movements; do not jerk. .Use legs to do the work, and pivot with feet. .Distribute the work load symmetrically and close to the center of trunk. .Push instead of pull whenever possible.   Ask For Help   Ask for help and delegate to others when possible. Coordinate your movements when lifting together, and maintain the low back curve.  Log Roll   Lying on back, bend left knee and place left arm across chest. Roll all in one movement to the right. Reverse to roll to the left. Always move as one unit. Housework - Sweeping  Use long-handled equipment to avoid stooping.   Housework - Wiping  Position yourself as close as possible to reach work surface. Avoid straining your back.  Laundry - Unloading Wash   To unload small items at bottom of washer, lift leg opposite to arm being used to  reach.  Gardening - Raking  Move close to area to be raked. Use arm movements to do the work. Keep back straight and avoid twisting.     Cart  When reaching into cart with one arm, lift opposite leg to keep back straight.   Getting Into / Out of Bed  Lower self to lie down on one side by raising legs and lowering head at the same time. Use arms to assist moving without twisting. Bend both knees to roll onto back if desired. To sit up, start from lying on side, and use same move-ments in reverse. Housework - Vacuuming  Hold the vacuum with arm held at side. Step back and forth to move it, keeping head up. Avoid twisting.   Laundry - Loading [employer] DevicesWash  Position laundry basket so that bending and twisting can be avoided.   Laundry - Unloading Dryer  Squat down to reach into clothes dryer or use a reacher.  Gardening - Weeding / Psychiatric nurse or Kneel. Knee pads may be helpful.

## 2015-11-05 ENCOUNTER — Ambulatory Visit: Payer: Medicare Other | Admitting: Physical Therapy

## 2015-11-11 ENCOUNTER — Encounter: Payer: No Typology Code available for payment source | Admitting: Physical Therapy

## 2015-11-19 ENCOUNTER — Encounter: Payer: No Typology Code available for payment source | Admitting: Physical Therapy

## 2015-11-26 ENCOUNTER — Encounter: Payer: No Typology Code available for payment source | Admitting: Physical Therapy

## 2015-12-03 ENCOUNTER — Encounter: Payer: No Typology Code available for payment source | Admitting: Physical Therapy

## 2016-09-18 ENCOUNTER — Other Ambulatory Visit: Payer: Self-pay | Admitting: Family Medicine

## 2016-09-18 ENCOUNTER — Ambulatory Visit
Admission: RE | Admit: 2016-09-18 | Discharge: 2016-09-18 | Disposition: A | Discharge status: Home / Self Care | Payer: Medicare Other | Source: Ambulatory Visit | Attending: Family Medicine | Admitting: Family Medicine

## 2016-09-18 DIAGNOSIS — Z1231 Encounter for screening mammogram for malignant neoplasm of breast: Secondary | ICD-10-CM

## 2016-09-29 ENCOUNTER — Other Ambulatory Visit: Payer: Self-pay | Admitting: Family Medicine

## 2016-09-29 ENCOUNTER — Ambulatory Visit
Admission: RE | Admit: 2016-09-29 | Discharge: 2016-09-29 | Disposition: A | Discharge status: Home / Self Care | Payer: Medicare Other | Source: Ambulatory Visit | Attending: Family Medicine | Admitting: Family Medicine

## 2016-09-29 DIAGNOSIS — R413 Other amnesia: Secondary | ICD-10-CM

## 2016-09-29 IMAGING — CT CT HEAD W/O CM
3 of 4 series · 16 of 47 positions shown, 19 images · non-contrast
Comparison: None.

CLINICAL DATA: Headache, memory loss

EXAM:
CT HEAD WITHOUT CONTRAST
TECHNIQUE: Contiguous axial images were obtained from the base of the skull
through the vertex without intravenous contrast.

[Series 32: 3d filtered head w/o · axial · non-contrast · 0.49mm/px · z∈[-18,+102]mm · 10 of 28 slices shown, 13 images]
[im 2/28  brain]
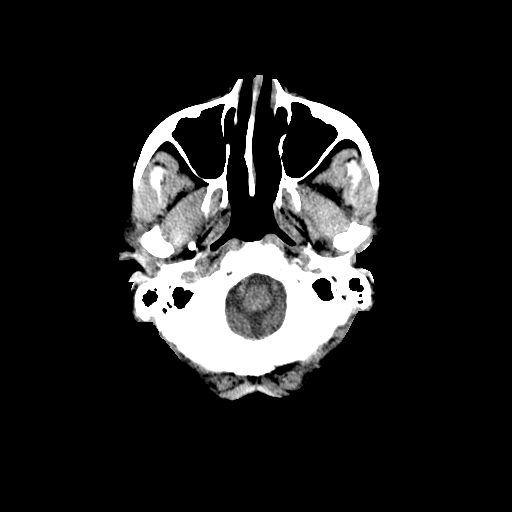
[im 2/28  bone]
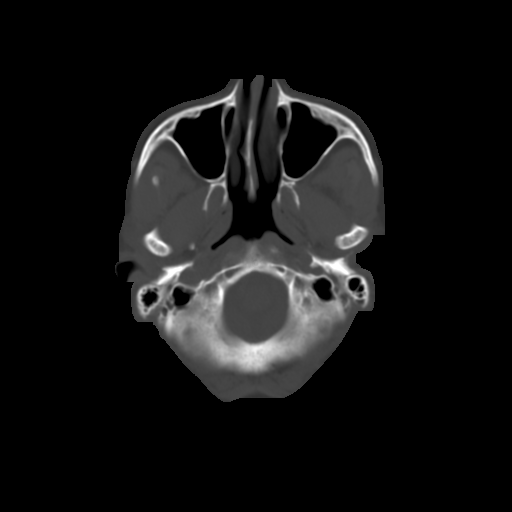
[im 4/28  brain]
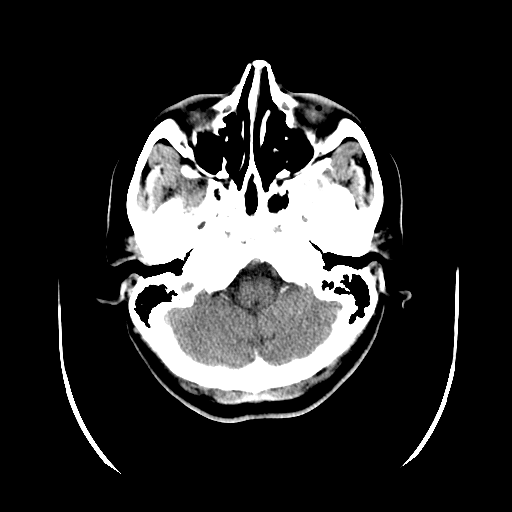
[im 8/28  brain]
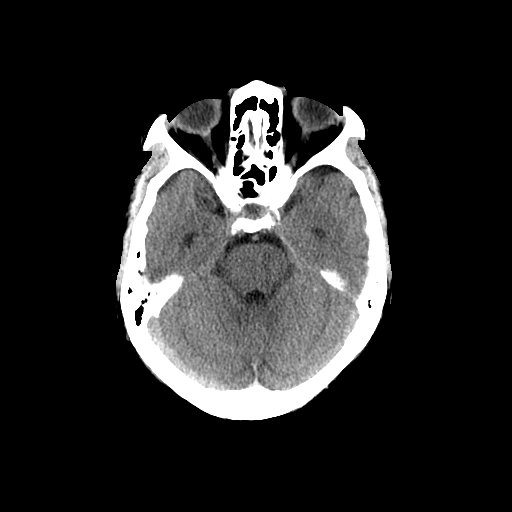
[im 10/28  brain]
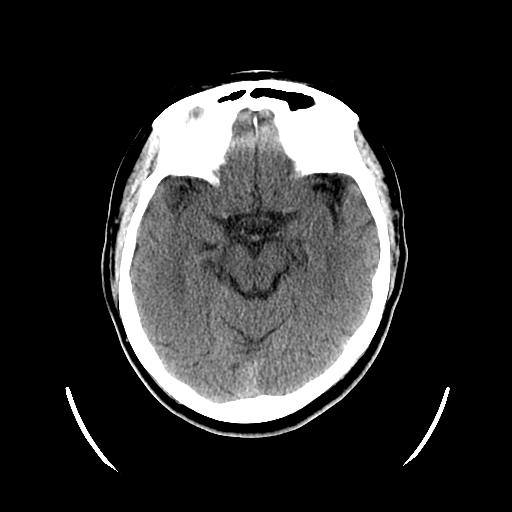
[im 12/28  brain]
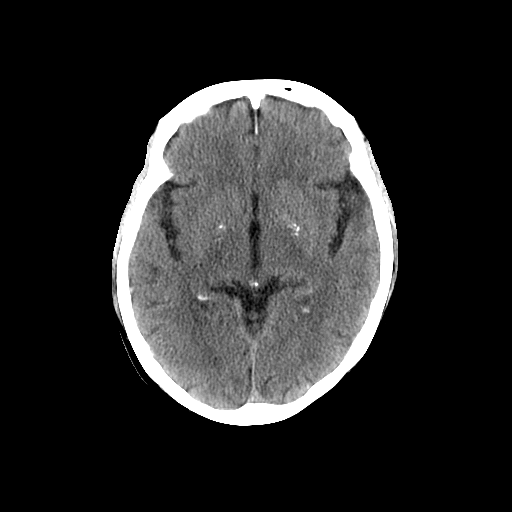
[im 12/28  bone]
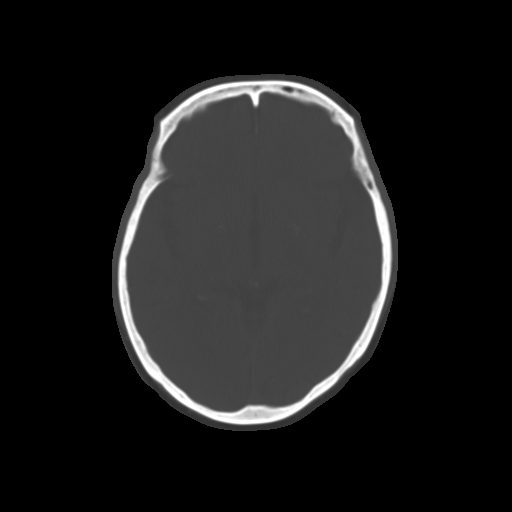
[im 16/28  brain]
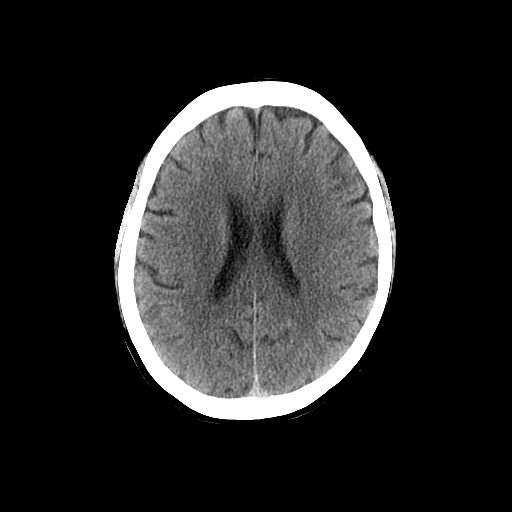
[im 18/28  brain]
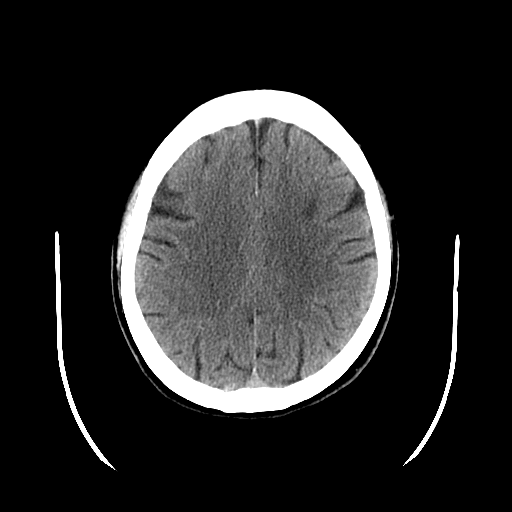
[im 20/28  brain]
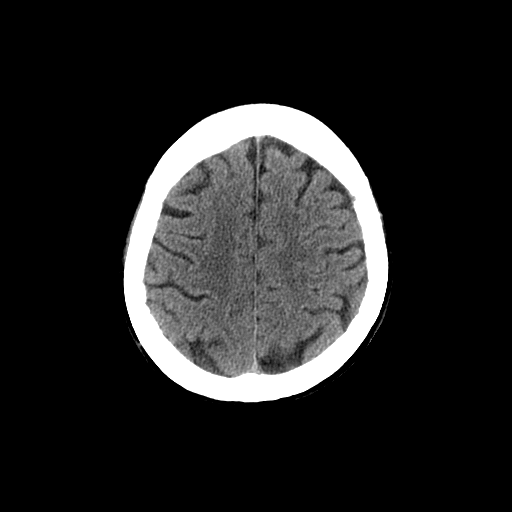
[im 24/28  brain]
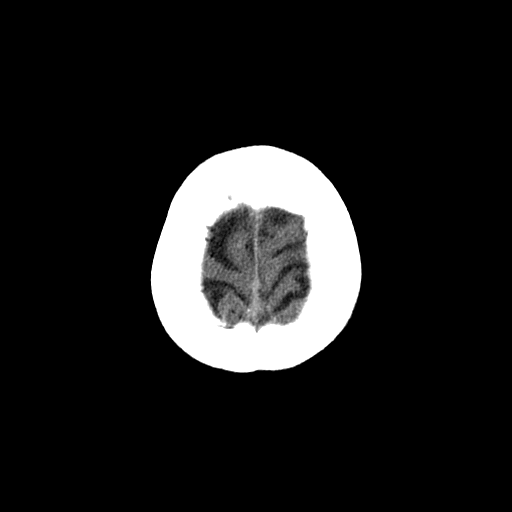
[im 24/28  bone]
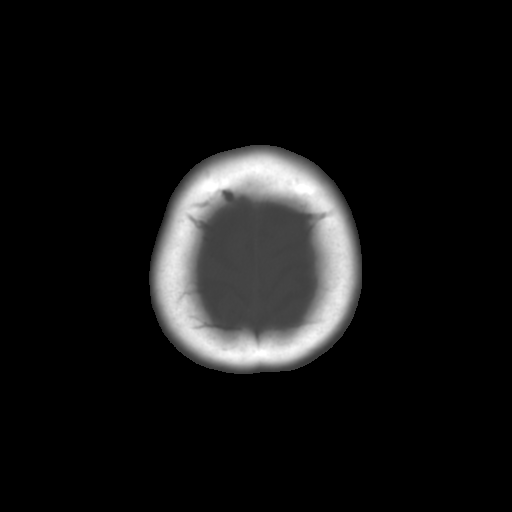
[im 26/28  brain]
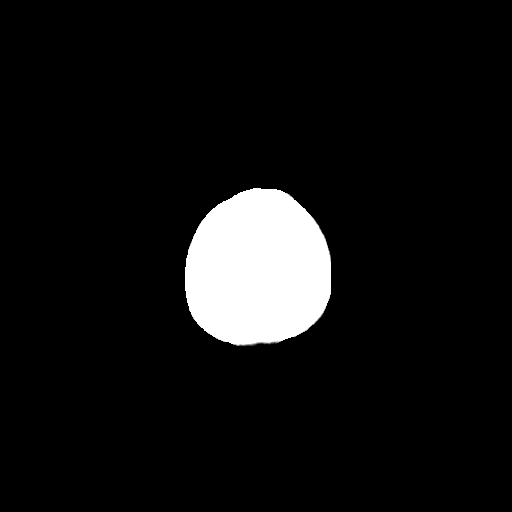

[Series 601: coronal brain · coronal · 0.49mm/px · 3 of 67 slices shown]
[im 23/67  brain]
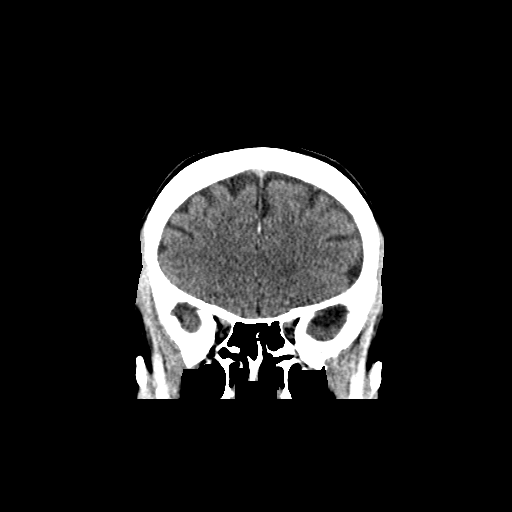
[im 30/67  brain]
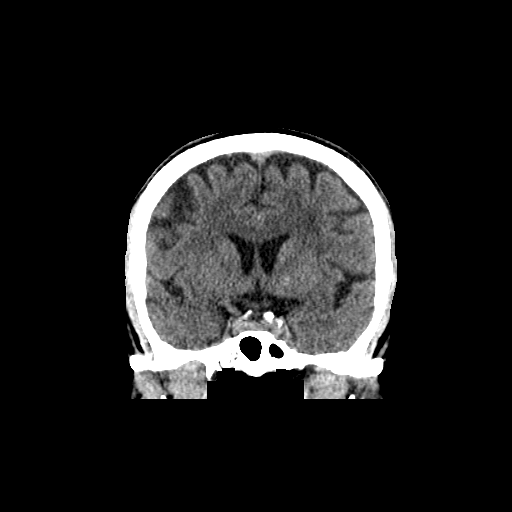
[im 37/67  brain]
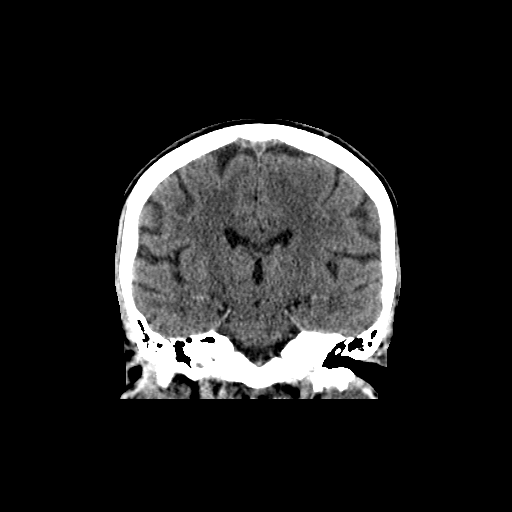

[Series 602: sagittal brain · sagittal · 0.49mm/px · 3 of 55 slices shown]
[im 19/55  brain]
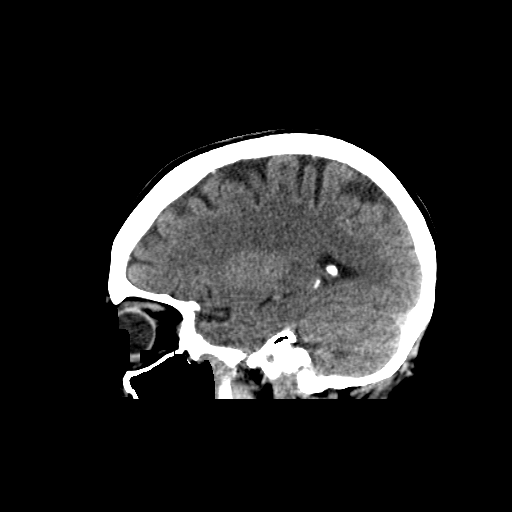
[im 28/55  brain]
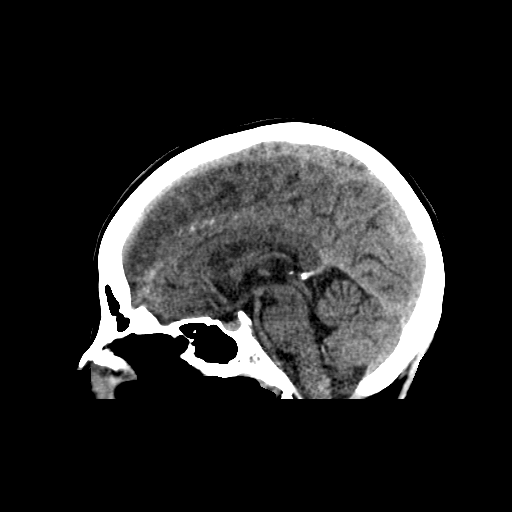
[im 37/55  brain]
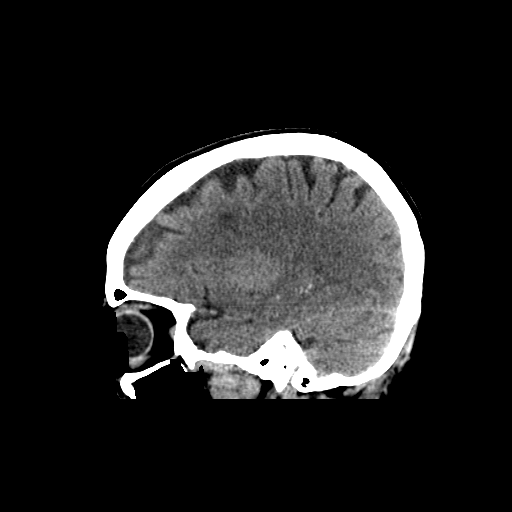

[16 of 47 positions shown; findings below may reference images not displayed]

FINDINGS: Brain: No intracranial hemorrhage, mass effect or midline shift. No
acute cortical infarction. Mild cerebral atrophy. Patchy subcortical
white matter decreased attenuation probable due to chronic small
vessel ischemic changes. No definite acute cortical infarction.
Bilateral basilar ganglia punctate calcifications are noted.

Vascular: No hyperdense vessel or unexpected calcification.

Skull: Normal. Negative for fracture or focal lesion.

Sinuses/Orbits: No acute finding.

Other: None.
IMPRESSION: No acute intracranial abnormality. Mild cerebral atrophy. Patchy
subcortical white matter decreased attenuation probable due to
chronic small vessel ischemic changes. No definite acute cortical
infarction.

## 2016-10-09 ENCOUNTER — Ambulatory Visit: Payer: Medicare Other | Admitting: Neurology

## 2016-10-23 ENCOUNTER — Encounter (HOSPITAL_COMMUNITY): Payer: Self-pay

## 2016-10-23 ENCOUNTER — Emergency Department (HOSPITAL_COMMUNITY)
Admission: EM | Admit: 2016-10-23 | Discharge: 2016-10-23 | Disposition: A | Discharge status: Home / Self Care | Payer: Medicare Other | Attending: Emergency Medicine | Admitting: Emergency Medicine

## 2016-10-23 ENCOUNTER — Emergency Department (HOSPITAL_COMMUNITY): Payer: Medicare Other

## 2016-10-23 ENCOUNTER — Encounter: Payer: Self-pay | Admitting: Neurology

## 2016-10-23 ENCOUNTER — Ambulatory Visit (INDEPENDENT_AMBULATORY_CARE_PROVIDER_SITE_OTHER): Payer: Medicare Other | Admitting: Neurology

## 2016-10-23 VITALS — BP 142/85 | HR 89 | Ht 59.0 in | Wt 109.2 lb

## 2016-10-23 DIAGNOSIS — R0789 Other chest pain: Secondary | ICD-10-CM | POA: Diagnosis present

## 2016-10-23 DIAGNOSIS — F3289 Other specified depressive episodes: Secondary | ICD-10-CM | POA: Diagnosis not present

## 2016-10-23 DIAGNOSIS — G319 Degenerative disease of nervous system, unspecified: Secondary | ICD-10-CM

## 2016-10-23 DIAGNOSIS — Z794 Long term (current) use of insulin: Secondary | ICD-10-CM | POA: Diagnosis not present

## 2016-10-23 DIAGNOSIS — R4189 Other symptoms and signs involving cognitive functions and awareness: Secondary | ICD-10-CM | POA: Diagnosis not present

## 2016-10-23 DIAGNOSIS — G300 Alzheimer's disease with early onset: Secondary | ICD-10-CM

## 2016-10-23 DIAGNOSIS — F0280 Dementia in other diseases classified elsewhere without behavioral disturbance: Secondary | ICD-10-CM

## 2016-10-23 LAB — CBC
HCT: 42.9 % (ref 36.0–46.0)
Hemoglobin: 14.7 g/dL (ref 12.0–15.0)
MCH: 31.2 pg (ref 26.0–34.0)
MCHC: 34.3 g/dL (ref 30.0–36.0)
MCV: 91.1 fL (ref 78.0–100.0)
Platelets: 290 10*3/uL (ref 150–400)
RBC: 4.71 MIL/uL (ref 3.87–5.11)
RDW: 12.4 % (ref 11.5–15.5)
WBC: 7.2 10*3/uL (ref 4.0–10.5)

## 2016-10-23 LAB — BASIC METABOLIC PANEL
Anion gap: 11 (ref 5–15)
BUN: 13 mg/dL (ref 6–20)
CO2: 22 mmol/L (ref 22–32)
Calcium: 9.6 mg/dL (ref 8.9–10.3)
Chloride: 101 mmol/L (ref 101–111)
Creatinine, Ser: 0.86 mg/dL (ref 0.44–1.00)
GFR calc Af Amer: >60 mL/min (ref >60)
GFR calc non Af Amer: >60 mL/min (ref >60)
Glucose, Bld: 302 mg/dL — ABNORMAL HIGH (ref 65–99)
Potassium: 4.3 mmol/L (ref 3.5–5.1)
Sodium: 134 mmol/L — ABNORMAL LOW (ref 135–145)

## 2016-10-23 LAB — D-DIMER, QUANTITATIVE: D-Dimer, Quant: <0.27 ug/mL-FEU (ref 0.00–0.50)

## 2016-10-23 LAB — I-STAT TROPONIN, ED: Troponin i, poc: 0 ng/mL (ref 0.00–0.08)

## 2016-10-23 IMAGING — DX DG CHEST 2V
2 series · 2 of 2 positions shown · non-contrast
Comparison: PA and lateral chest x-ray [DATE]

CLINICAL DATA: Choking episode 3 days ago with persistent
left-sided sharp chest pain radiating posteriorly associated with
shortness of breath. The pain is made worse with laughing or crying.
History of diabetes.

EXAM:
CHEST  2 VIEW

[chest pa]
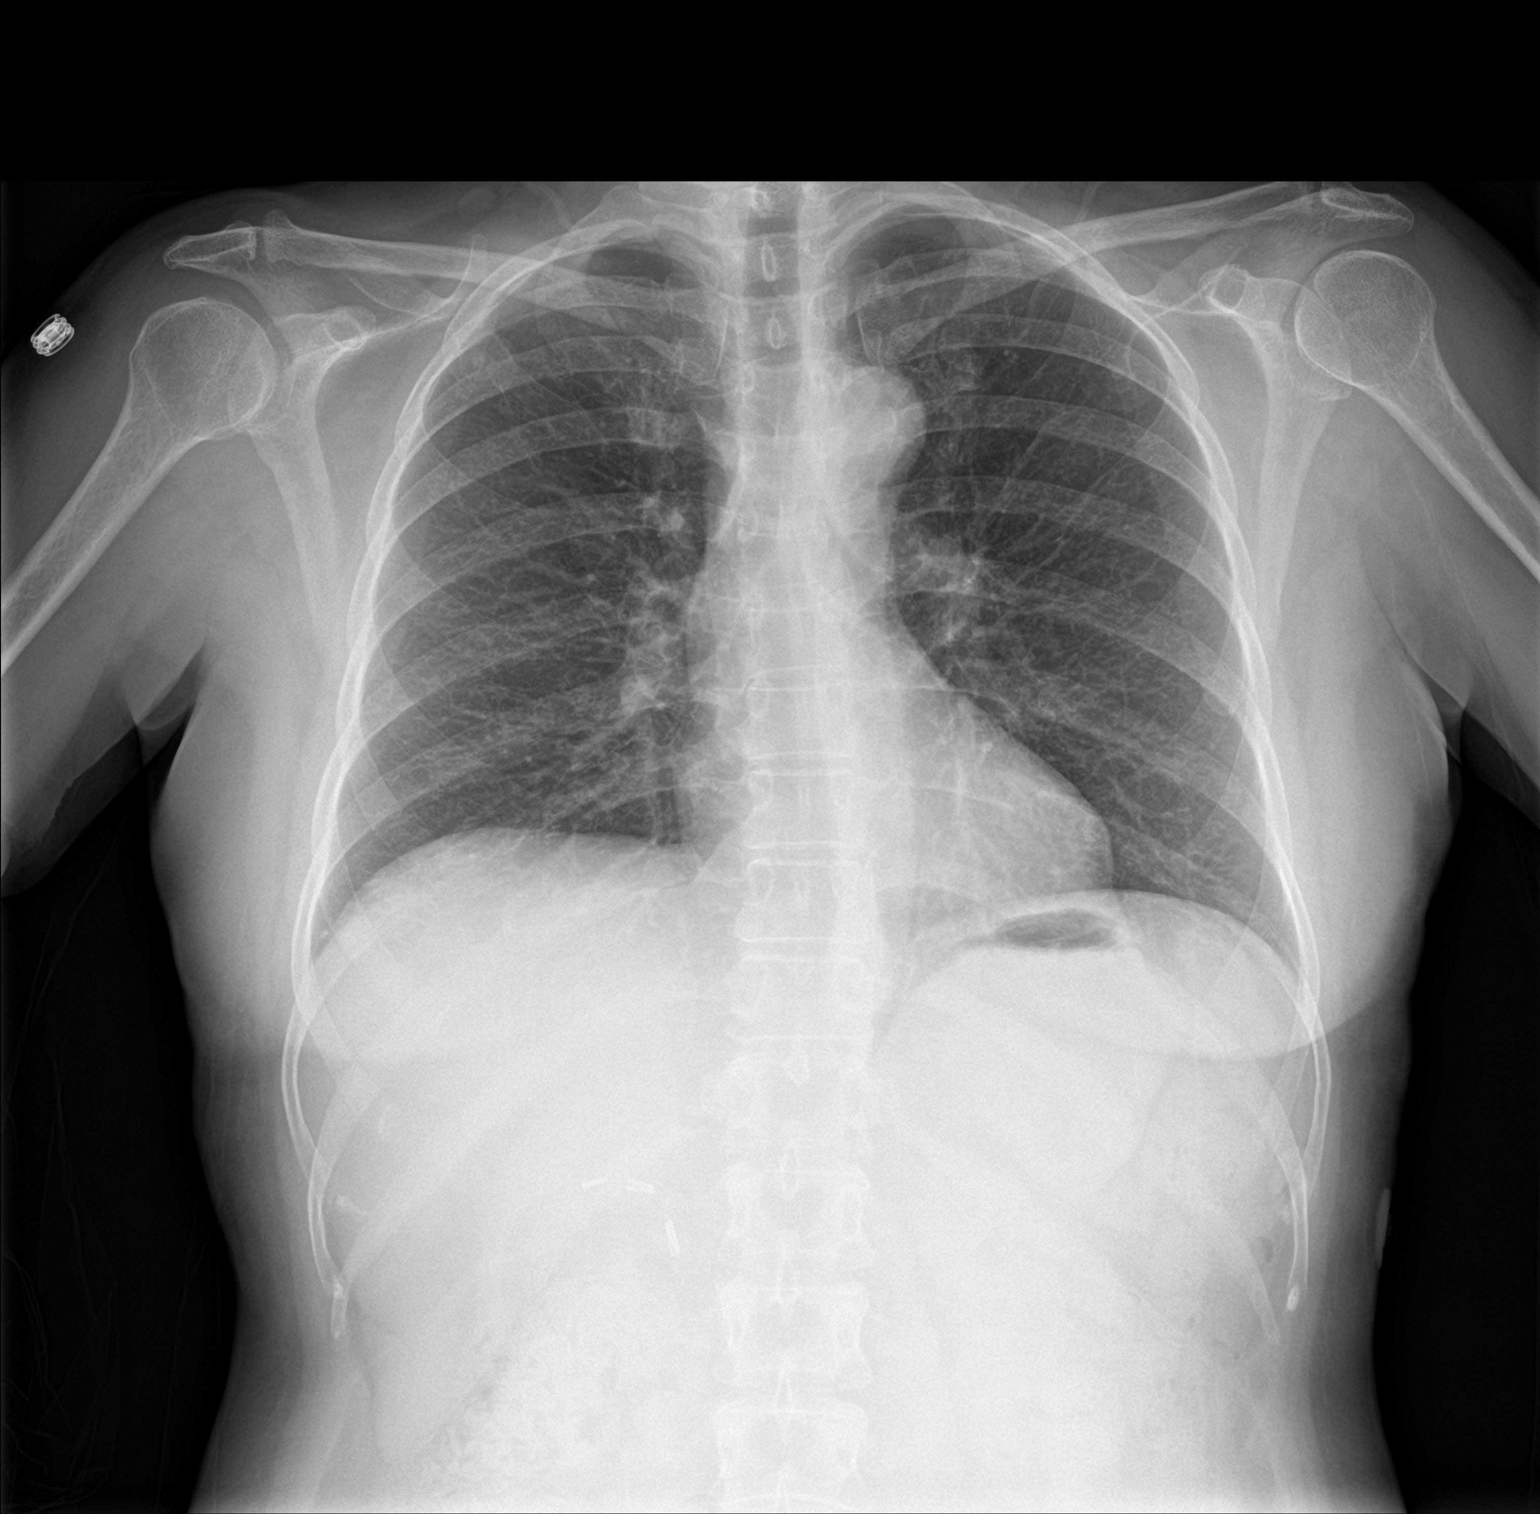

[chest lat]
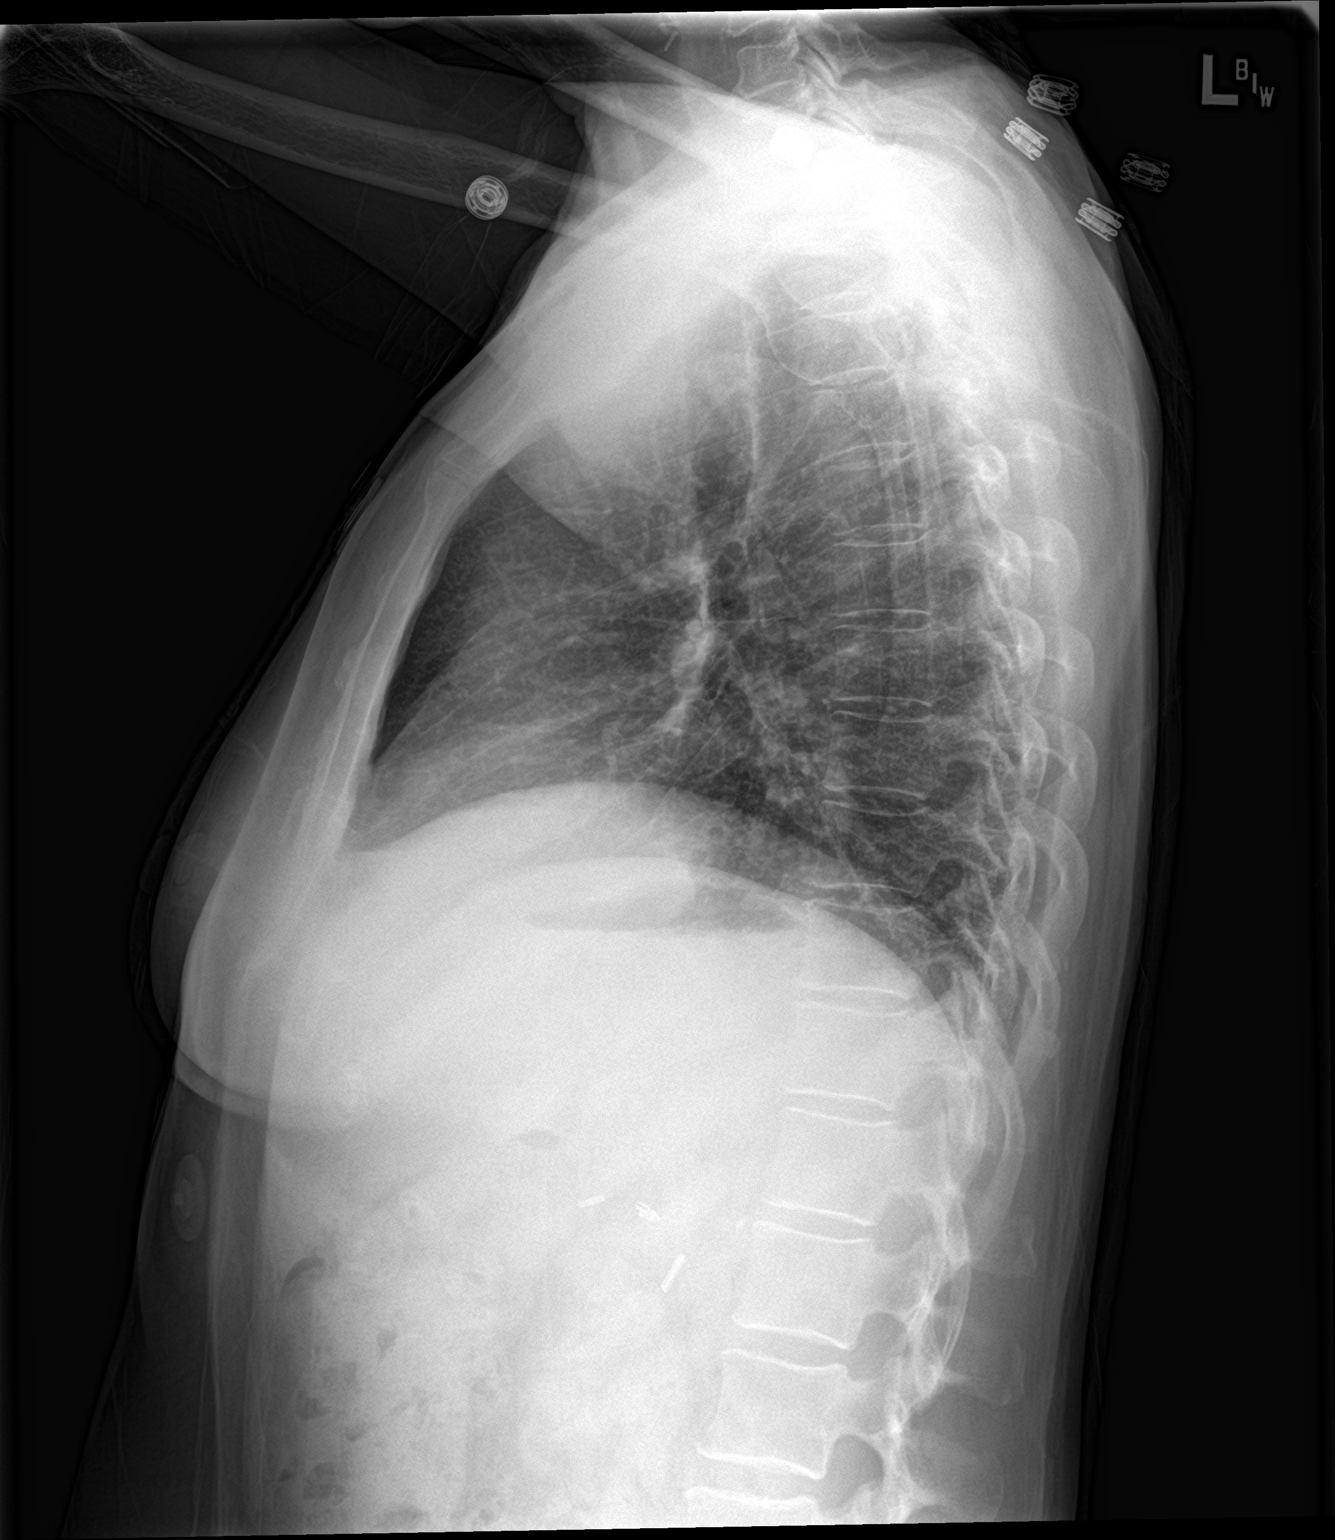

[2 of 2 positions shown; findings below may reference images not displayed]

FINDINGS: The lungs are reasonably well inflated. The interstitial markings
are coarse though stable. There is no pleural effusion,
pneumothorax, or pneumomediastinum. The mediastinum is normal in
width. The heart and pulmonary vascularity are normal. There is
calcification in the wall of the aortic arch. The bony thorax
exhibits no acute abnormality. There surgical clips in the
gallbladder fossa and at the base of the neck on the left.
IMPRESSION: Mild chronic bronchitic changes. No pneumonia nor CHF. No
pneumothorax, pneumomediastinum, or mediastinal widening.

Thoracic aortic atherosclerosis.

## 2016-10-23 NOTE — Progress Notes (Signed)
[facility]    Provider:  Dr Lucia GaskinsAhern Referring Provider: Juluis RainierBarnes, Elizabeth, MD Primary Care Physician:  Gaye AlkenBARNES,ELIZABETH STEWART, MD  CC:  Memory changes  HPI:  Haley RamusBlanca Miriam Bartlett is a 65 y.o. female here as a referral from Dr. Zachery DauerBarnes for memory changes. Past medical history B12 deficiency, anxiety, type 2 diabetes, hypothyroidism, diabetic neuropathy. Cervical disc disease, hyperlipidemia, insomnia. She has a family history of Alzheimer's and her sister who died at 2264. She is here with interpreter and husband; husband provides most information. 3 years ago she started feeling cramping on her head, she started experiencing memory issues. When she is driving she gets lost, she can;t remember how to get places. Husband drives, it is getting harder for her to drive and get places. 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 Husband sometimes gives her a goodnight kiss and then she says she doesn;t remember it, she asks the same questions over and over again, it is more short-term memory loss. She remembers more long term events. She loses objects. Husband has always paid the bills. She tries to cook but sometimes she forgets ingredients or what she has put in the food. She has forgotten things cooking but no accidents. She has forgotten things in the stove. She still endorses hobbies and wants to be social, no changes socially. She endorses depression for 2 years. She has not been treated for her depression. They go to the gym every day. She retired 4 years ago, she is depressed and cries most days. Early onset alzheimers in her sister presented around 6730 and died recently at 5464. Sister died, grandbaby died this year, lots of family stressors. 3 weeks ago another sister died in addition to her sister with alzheimers died last year.    Reviewed notes, labs and imaging from outside physicians, which showed:  BUN 14, creatinine 0.83 09/28/2016, TSH 1.57, A1c 7. 08/15/2016  B12 is 151 labs 09/29/2016.  Reviewed primary care notes. Patient is concerned because she is forgetful at times. Sometimes she sees things in the kitchen. She has not gotten lost while driving but does not drive frequently. She has some numbness in the back of her head and sometimes feels numbness in her arms at night. She is anxious and nervous. Perhaps some depression but no suicidal ideation. She is very concerned because her grandmother died with memory problems and her sister died at age 63 with Alzheimer's disease. Sr. symptoms showed up at about age 65. She has never smoked, no alcohol, she eats a vegetarian diet.   CT of the head personally reviewed images 09/2016: No acute intracranial abnormality. Mild cerebral atrophy. Patchy subcortical white matter decreased attenuation probable due to chronic small vessel ischemic changes. No definite acute cortical infarction.   Review of Systems: Patient complains of symptoms per HPI as well as the following symptoms: Chills, fatigue, blurred vision, eye pain, shortness of breath, feeling cold, increased thirst, memory loss, confusion, headache, numbness, weakness, insomnia. Pertinent negatives per HPI. All others negative.   Social History   Social History  . Marital status: Married    Spouse name: Haley Bartlett  . Number of children: 2  . Years of education: 12   Occupational History  . retired    Social History Main Topics  . Smoking status: Never Smoker  .  Smokeless tobacco: Never Used  . Alcohol use No  . Drug use: No  . Sexual activity: Not on file   Other Topics Concern  . Not on file   Social History Narrative   Lives with Haley Bartlett, husband   Caffeine use: Coffee daily    Family History  Problem Relation Age of Onset  . Dementia Sister     Past Medical History:  Diagnosis Date   . Depression     Past Surgical History:  Procedure Laterality Date  . ABDOMINAL HYSTERECTOMY    . APPENDECTOMY    . THYROIDECTOMY      Current Outpatient Prescriptions  Medication Sig Dispense Refill  . insulin lispro protamine-lispro (HUMALOG 75/25 MIX) (75-25) 100 UNIT/ML SUSP injection Inject 12-14 Units into the skin See admin instructions. Inject 14 units in the morning and inject 12 units in the evening.    Marland Kitchen. levothyroxine (SYNTHROID, LEVOTHROID) 25 MCG tablet Take 25 mcg by mouth daily before breakfast.    . metFORMIN (GLUCOPHAGE) 1000 MG tablet Take 1,000 mg by mouth daily.     . cyanocobalamin (,VITAMIN B-12,) 1000 MCG/ML injection Inject 1,000 mcg into the muscle every Wednesday.      No current facility-administered medications for this visit.     Allergies as of 10/23/2016  . (No Known Allergies)    Vitals: BP (!) 142/85 (BP Location: Right Arm, Patient Position: Sitting, Cuff Size: Normal)   Pulse 89   Ht 4\' 11"  (1.499 m)   Wt 109 lb 3.2 oz (49.5 kg)   BMI 22.06 kg/m  Last Weight:  Wt Readings from Last 1 Encounters:  10/23/16 109 lb (49.4 kg)   Last Height:   Ht Readings from Last 1 Encounters:  10/23/16 4\' 11"  (1.499 m)    Physical exam: Exam: Gen: NAD, conversant, well nourised, obese, well groomed                     CV: RRR, no MRG. No Carotid Bruits. No peripheral edema, warm, nontender Eyes: Conjunctivae clear without exudates or hemorrhage  Neuro: Detailed Neurologic Exam  Speech:    Speech is normal; fluent and spontaneous with normal comprehension.  Cognition:    The patient is oriented to person, place, and time;     recent and remote memory intact;     language fluent;     normal attention, concentration,     fund of knowledge Cranial Nerves:    The pupils are equal, round, and reactive to light. The fundi are normal and spontaneous venous pulsations are present. Visual fields are full to finger confrontation. Extraocular movements  are intact. Trigeminal sensation is intact and the muscles of mastication are normal. The face is symmetric. The palate elevates in the midline. Hearing intact. Voice is normal. Shoulder shrug is normal. The tongue has normal motion without fasciculations.   Coordination:    Normal finger to nose and heel to shin. Normal rapid alternating movements.   Gait:    Heel-toe and tandem gait are normal.   Motor Observation:    No asymmetry, no atrophy, and no involuntary movements noted. Tone:    Normal muscle tone.    Posture:    Posture is normal. normal erect    Strength:    Strength is V/V in the upper and lower limbs.      Sensation: intact to LT     Reflex Exam:  DTR's:    Deep tendon reflexes in the upper and lower  extremities are normal bilaterally.   Toes:    The toes are downgoing bilaterally.   Clonus:    Clonus is absent.      Assessment/Plan:  A very nice 65 year old female with past medical history of B12 deficiency and early onset Alzheimer's in her sister. She is here with complaints of short-term memory loss and she is very worried given her history.  She is depressed and cries most days, has been depressed for 2 years now. Early onset alzheimers in her sister presented around 25 and sister died recently at 42. She reports significant depression and recent life stressors including grandbaby died this year and  3 weeks ago another sister died in addition to her sister with alzheimers died last year.    - I recommend she set up an appointment with Dr. Zachery Dauer to discuss her depression, options for treatment such as an SSRI and to discuss other supportive measures such as therapy - MRI of the brain - Formal neurocognitive testing to try and evaluate pseudo-dementia versus degenerative neurocognitive disorder. - Treating B12 deficiency  Naomie Dean, MD  [facility] [address] [place], [place] [address]  Phone [phone] Fax  [phone]

## 2016-10-23 NOTE — ED Provider Notes (Signed)
[facility]-EMER[facility]ENCY DEPT Provider Note   CSN: [id number] Arrival date & time: 10/23/16  1134     History   Chief Complaint Chief Complaint  Patient presents with  . Chest Pain    HPI Haley Bartlett is a 65 y.o. female.  The history is provided by the patient and medical records. Language interpreter used: Daughter at bedside aiding in translation as needed.  Chest Pain   Associated symptoms include back pain and shortness of breath. Pertinent negatives include no abdominal pain, no cough, no fever, no headaches, no nausea, no palpitations and no vomiting.   Haley Bartlett is a 65 y.o. female who presents to the Emergency Department complaining of worsening chest pain across chest wall x 11 days which occasionally radiates to back as well. Patient states that at onset of symptoms she was eating carrots and potatoes when she started coughing and felt like food was stuck. Symptoms improved that day and she has been able to tolerate small sips of water and soft foods cut in small pieces without difficulty. She states that her pain is worse with deep breathing and swallowing. She has taken no medications for pain. She endorses intermittent shortness of breath, but none at present. She denies abdominal pain, nausea, vomiting, fevers or diaphoresis, congestion, sore throat. She denies a foreign body sensation, but feels as if her throat and back are sore from the food.   No past medical history on file.  There are no active problems to display for this patient.   Past Surgical History:  Procedure Laterality Date  . ABDOMINAL HYSTERECTOMY    . APPENDECTOMY    . THYROIDECTOMY      OB History    No data available       Home Medications    Prior to Admission medications   Medication Sig Start Date End Date Taking? Authorizing Provider  cyanocobalamin (,VITAMIN B-12,) 1000 MCG/ML injection Inject 1,000 mcg into the muscle every Wednesday.    Yes Historical Provider, MD    insulin lispro protamine-lispro (HUMALOG 75/25 MIX) (75-25) 100 UNIT/ML SUSP injection Inject 12-14 Units into the skin See admin instructions. Inject 14 units in the morning and inject 12 units in the evening.   Yes Historical Provider, MD  levothyroxine (SYNTHROID, LEVOTHROID) 25 MCG tablet Take 25 mcg by mouth daily before breakfast.   Yes Historical Provider, MD  metFORMIN (GLUCOPHAGE) 1000 MG tablet Take 1,000 mg by mouth daily.    Yes Historical Provider, MD    Family History No family history on file.  Social History Social History  Substance Use Topics  . Smoking status: Never Smoker  . Smokeless tobacco: Never Used  . Alcohol use No     Allergies   Patient has no known allergies.   Review of Systems Review of Systems  Constitutional: Negative for fever.  HENT: Negative for congestion.   Eyes: Negative for visual disturbance.  Respiratory: Positive for shortness of breath. Negative for cough.   Cardiovascular: Positive for chest pain. Negative for palpitations and leg swelling.  Gastrointestinal: Negative for abdominal pain, constipation, diarrhea, nausea and vomiting.  Genitourinary: Negative for dysuria.  Musculoskeletal: Positive for back pain. Negative for neck pain.  Skin: Negative for rash.  Neurological: Negative for headaches.    Physical Exam Updated Vital Signs BP 129/78   Pulse 105   Temp 98.6 F (37 C) (Oral)   Resp 20   Ht 4\' 11"  (1.499 m)   Wt 49.4 kg   SpO2  95%   BMI 22.02 kg/m   Physical Exam  Constitutional: She is oriented to person, place, and time. She appears well-developed and well-nourished. No distress.  HENT:  Head: Normocephalic and atraumatic.  Mouth/Throat: Oropharynx is clear and moist.  Airway patent.  Neck: Normal range of motion. Neck supple.  Cardiovascular: Normal heart sounds and intact distal pulses.   No murmur heard. Mildly tachycardic but regular.   Pulmonary/Chest: Effort normal and breath sounds normal. No  respiratory distress. She has no wheezes. She has no rales. She exhibits tenderness.  Abdominal: Soft. She exhibits no distension. There is no tenderness.  Musculoskeletal: She exhibits no edema.  Neurological: She is alert and oriented to person, place, and time.  Skin: Skin is warm and dry.  Nursing note and vitals reviewed.    ED Treatments / Results  Labs (all labs ordered are listed, but only abnormal results are displayed) Labs Reviewed  BASIC METABOLIC PANEL - Abnormal; Notable for the following:       Result Value   Sodium 134 (*)    Glucose, Bld 302 (*)    All other components within normal limits  CBC  D-DIMER, QUANTITATIVE (NOT AT [facility])  Rosezena SensorI-STAT TROPOININ, ED    EKG  EKG Interpretation  Date/Time:  Friday October 23 2016 11:49:42 EST Ventricular Rate:  95 PR Interval:  160 QRS Duration: 70 QT Interval:  340 QTC Calculation: 427 R Axis:   0 Text Interpretation:  Normal sinus rhythm Possible Left atrial enlargement Low voltage QRS Nonspecific ST abnormality Abnormal ECG Confirmed by Juleen ChinaKOHUT  MD, STEPHEN [phone]([id number]) on 10/23/2016 1:50:22 PM       Radiology Dg Chest 2 View  Result Date: 10/23/2016 CLINICAL DATA:  Choking episode 3 days ago with persistent left-sided sharp chest pain radiating posteriorly associated with shortness of breath. The pain is made worse with laughing or crying. History of diabetes. EXAM: CHEST  2 VIEW COMPARISON:  PA and lateral chest x-ray of May 09, 2011 FINDINGS: The lungs are reasonably well inflated. The interstitial markings are coarse though stable. There is no pleural effusion, pneumothorax, or pneumomediastinum. The mediastinum is normal in width. The heart and pulmonary vascularity are normal. There is calcification in the wall of the aortic arch. The bony thorax exhibits no acute abnormality. There surgical clips in the gallbladder fossa and at the base of the neck on the left. IMPRESSION: Mild chronic bronchitic changes. No pneumonia  nor CHF. No pneumothorax, pneumomediastinum, or mediastinal widening. Thoracic aortic atherosclerosis. Electronically Signed   By: David  SwazilandJordan M.D.   On: 10/23/2016 12:30    Procedures Procedures (including critical care time)  Medications Ordered in ED Medications - No data to display   Initial Impression / Assessment and Plan / ED Course  I have reviewed the triage vital signs and the nursing notes.  Pertinent labs & imaging results that were available during my care of the patient were reviewed by me and considered in my medical decision making (see chart for details).  Clinical Course    Haley Bartlett is a 65 y.o. female who presents to ED for chest pain and back pain which began after she believes she choked on food 11 days ago. On exam, she is mildly tachycardic but very well appearing with normal heart and lung exam. She does exhibit tenderness to her entire chest wall. Troponin negative, cbc wdl, bmp with elevated glucose but otherwise reassuring. CXR with no no consolidation, PNX, or mediastinal widening. EKG reviewed with  attending and reassuring. Given tachycardia with pain worse with inspiration, d-dimer was obtained which was negative. PE very unlikely. Cardiopulmonary etiology low on differential. Possible GI etiology/stricture. I had patient drink a cup of water while I was in the room and patient did so with no distress. Daughter at bedside states that she has been able to stay well-nourished. Evaluation does not show pathology that would require ongoing emergent intervention or inpatient treatment. Patient is hemodynamically stable and mentating appropriately. Outpatient GI follow-up strongly encouraged and referral given. Reasons to return to the ER were discussed and all questions answered.  Patient discussed with Dr. Juleen China who agrees with treatment plan.   Final Clinical Impressions(s) / ED Diagnoses   Final diagnoses:  Atypical chest pain    New  Prescriptions Discharge Medication List as of 10/23/2016  2:30 PM       [facility] Kayda Allers, PA-C 10/23/16 1503    Raeford Razor, MD 11/02/16 1130

## 2016-10-23 NOTE — Discharge Instructions (Signed)
Please call the GI clinic listed to schedule your follow up appointment.  Return to the ER for difficulty swallowing, difficulty breathing, new or worsening symptoms, any additional concerns.

## 2016-10-23 NOTE — ED Triage Notes (Addendum)
Rt. side chest pressure radiates into her back began last Tuesday after choking on a piece of food the chest pain  progressively becoming worse.  Pt. Denies any n/v/d.  Pt. Denies any cold symptoms.  Pt. 's daughter interprets for her.  Pt. Reports that night time the pain increases.  Skin is w/p/d. Alert and oriented x4.  Pt. Also reports that the rt. Upper back is very painful also. Pt. Is also having trouble Swallowing since last Tuesday.  She can only sip water in intervals. No sob noted or distress ECG completed in Triaged

## 2016-10-23 NOTE — ED Notes (Signed)
Family at bedside.daughter

## 2016-10-23 NOTE — ED Notes (Signed)
Patient is resting comfortably. 

## 2016-10-23 NOTE — Patient Instructions (Addendum)
Remember to drink plenty of fluid, eat healthy meals and do not skip any meals. Try to eat protein with a every meal and eat a healthy snack such as fruit or nuts in between meals. Try to keep a regular sleep-wake schedule and try to exercise daily, particularly in the form of walking, 20-30 minutes a day, if you can.   As far as your medications are concerned, I would like to suggest: Can consider starting Aricept after testing  As far as diagnostic testing: MRI brain, Formal neurocognitive testing, Treatment for depression  I would like to see you back in 4 months, sooner if we need to. Please call us with any interim questions, concerns, problems, updates or refill requests.   Our phone number is [phone]. We also have an after hours call service for urgent matters and there is a physician on-call for urgent questions. For any emergencies you know to call 911 or go to the nearest emergency room

## 2016-10-24 ENCOUNTER — Encounter: Payer: Self-pay | Admitting: Neurology

## 2016-10-24 DIAGNOSIS — F32A Depression, unspecified: Secondary | ICD-10-CM | POA: Insufficient documentation

## 2016-10-24 DIAGNOSIS — F329 Major depressive disorder, single episode, unspecified: Secondary | ICD-10-CM | POA: Insufficient documentation

## 2016-10-24 DIAGNOSIS — R4189 Other symptoms and signs involving cognitive functions and awareness: Secondary | ICD-10-CM | POA: Insufficient documentation

## 2016-11-15 ENCOUNTER — Ambulatory Visit
Admission: RE | Admit: 2016-11-15 | Discharge: 2016-11-15 | Disposition: A | Discharge status: Home / Self Care | Payer: Medicare Other | Source: Ambulatory Visit | Attending: Neurology | Admitting: Neurology

## 2016-11-15 DIAGNOSIS — R4189 Other symptoms and signs involving cognitive functions and awareness: Secondary | ICD-10-CM

## 2016-11-15 DIAGNOSIS — F0280 Dementia in other diseases classified elsewhere without behavioral disturbance: Secondary | ICD-10-CM

## 2016-11-15 DIAGNOSIS — G300 Alzheimer's disease with early onset: Secondary | ICD-10-CM

## 2016-11-15 DIAGNOSIS — G319 Degenerative disease of nervous system, unspecified: Secondary | ICD-10-CM

## 2016-11-17 ENCOUNTER — Telehealth: Payer: Self-pay | Admitting: *Deleted

## 2016-11-17 NOTE — Telephone Encounter (Signed)
-----   Message from Anson FretAntonia B Ahern, MD sent at 11/16/2016  5:40 PM EST ----- Mri brain normal for age thanks

## 2016-11-17 NOTE — Telephone Encounter (Signed)
Called [employer] and spoke with Matilde SprangMariela [id number]. She called husband number and LVM about results per AA,MD note. She gave [facility] phone number if he has any further questions/concerns.

## 2016-12-08 ENCOUNTER — Encounter: Payer: Medicare Other | Admitting: Psychology

## 2016-12-14 ENCOUNTER — Other Ambulatory Visit (HOSPITAL_COMMUNITY)
Admission: RE | Admit: 2016-12-14 | Discharge: 2016-12-14 | Disposition: A | Discharge status: Home / Self Care | Payer: Medicare Other | Source: Ambulatory Visit | Attending: Physician Assistant | Admitting: Physician Assistant

## 2016-12-14 ENCOUNTER — Other Ambulatory Visit: Payer: Self-pay | Admitting: Physician Assistant

## 2016-12-14 DIAGNOSIS — Z124 Encounter for screening for malignant neoplasm of cervix: Secondary | ICD-10-CM | POA: Insufficient documentation

## 2016-12-14 DIAGNOSIS — Z1151 Encounter for screening for human papillomavirus (HPV): Secondary | ICD-10-CM | POA: Diagnosis present

## 2016-12-16 LAB — CYTOLOGY - PAP
DIAGNOSIS: NEGATIVE
HPV (WINDOPATH): NOT DETECTED

## 2017-02-22 ENCOUNTER — Ambulatory Visit: Payer: Medicare Other | Admitting: Neurology

## 2017-02-22 ENCOUNTER — Telehealth: Payer: Self-pay

## 2017-02-22 NOTE — Telephone Encounter (Signed)
Pt no-showed her appt this morning. 

## 2017-02-24 ENCOUNTER — Encounter: Payer: Self-pay | Admitting: Neurology

## 2017-04-07 ENCOUNTER — Other Ambulatory Visit: Payer: Self-pay | Admitting: Family Medicine

## 2017-04-07 DIAGNOSIS — I517 Cardiomegaly: Secondary | ICD-10-CM

## 2017-04-12 ENCOUNTER — Ambulatory Visit (HOSPITAL_COMMUNITY): Payer: Medicare Other | Attending: Cardiology

## 2017-04-12 ENCOUNTER — Other Ambulatory Visit: Payer: Self-pay

## 2017-04-12 DIAGNOSIS — E119 Type 2 diabetes mellitus without complications: Secondary | ICD-10-CM | POA: Diagnosis not present

## 2017-04-12 DIAGNOSIS — I361 Nonrheumatic tricuspid (valve) insufficiency: Secondary | ICD-10-CM | POA: Insufficient documentation

## 2017-04-12 DIAGNOSIS — E785 Hyperlipidemia, unspecified: Secondary | ICD-10-CM | POA: Insufficient documentation

## 2017-04-12 DIAGNOSIS — I5189 Other ill-defined heart diseases: Secondary | ICD-10-CM | POA: Insufficient documentation

## 2017-04-12 DIAGNOSIS — I517 Cardiomegaly: Secondary | ICD-10-CM | POA: Diagnosis not present

## 2017-09-08 ENCOUNTER — Other Ambulatory Visit: Payer: Self-pay | Admitting: Family Medicine

## 2017-09-08 DIAGNOSIS — Z1231 Encounter for screening mammogram for malignant neoplasm of breast: Secondary | ICD-10-CM

## 2017-09-20 ENCOUNTER — Ambulatory Visit: Payer: Medicare Other

## 2018-10-03 ENCOUNTER — Other Ambulatory Visit: Payer: Self-pay | Admitting: Radiology

## 2019-10-16 ENCOUNTER — Institutional Professional Consult (permissible substitution): Payer: Medicare Other | Admitting: Neurology

## 2019-11-13 ENCOUNTER — Ambulatory Visit: Payer: Medicare Other | Admitting: Neurology

## 2020-01-22 ENCOUNTER — Ambulatory Visit: Payer: Medicare Other | Admitting: Neurology

## 2020-02-29 ENCOUNTER — Ambulatory Visit: Payer: Medicare Other | Admitting: Neurology

## 2020-12-07 DIAGNOSIS — E1165 Type 2 diabetes mellitus with hyperglycemia: Secondary | ICD-10-CM | POA: Diagnosis not present

## 2020-12-19 DIAGNOSIS — M62830 Muscle spasm of back: Secondary | ICD-10-CM | POA: Diagnosis not present

## 2020-12-19 DIAGNOSIS — E782 Mixed hyperlipidemia: Secondary | ICD-10-CM | POA: Diagnosis not present

## 2020-12-19 DIAGNOSIS — Z0000 Encounter for general adult medical examination without abnormal findings: Secondary | ICD-10-CM | POA: Diagnosis not present

## 2020-12-19 DIAGNOSIS — E039 Hypothyroidism, unspecified: Secondary | ICD-10-CM | POA: Diagnosis not present

## 2020-12-19 DIAGNOSIS — E1165 Type 2 diabetes mellitus with hyperglycemia: Secondary | ICD-10-CM | POA: Diagnosis not present

## 2020-12-30 DIAGNOSIS — E113293 Type 2 diabetes mellitus with mild nonproliferative diabetic retinopathy without macular edema, bilateral: Secondary | ICD-10-CM | POA: Diagnosis not present

## 2021-01-07 DIAGNOSIS — E1165 Type 2 diabetes mellitus with hyperglycemia: Secondary | ICD-10-CM | POA: Diagnosis not present

## 2021-02-04 DIAGNOSIS — H903 Sensorineural hearing loss, bilateral: Secondary | ICD-10-CM | POA: Diagnosis not present

## 2021-02-04 DIAGNOSIS — E1165 Type 2 diabetes mellitus with hyperglycemia: Secondary | ICD-10-CM | POA: Diagnosis not present

## 2021-02-18 ENCOUNTER — Ambulatory Visit: Payer: Medicare Other | Admitting: Endocrinology

## 2021-02-18 ENCOUNTER — Encounter: Payer: Self-pay | Admitting: Endocrinology

## 2021-02-18 ENCOUNTER — Other Ambulatory Visit: Payer: Self-pay

## 2021-02-18 VITALS — BP 110/60 | HR 81 | Ht 59.0 in | Wt 101.4 lb

## 2021-02-18 DIAGNOSIS — E1369 Other specified diabetes mellitus with other specified complication: Secondary | ICD-10-CM

## 2021-02-18 DIAGNOSIS — R4189 Other symptoms and signs involving cognitive functions and awareness: Secondary | ICD-10-CM | POA: Diagnosis not present

## 2021-02-18 LAB — POCT GLYCOSYLATED HEMOGLOBIN (HGB A1C): Hemoglobin A1C: 6.9 % — AB (ref 4.0–5.6)

## 2021-02-18 MED ORDER — METFORMIN HCL ER 500 MG PO TB24: 2000.0000 mg | ORAL_TABLET | Freq: Every day | ORAL | 3 refills | Status: DC

## 2021-02-18 MED ORDER — INSULIN LISPRO PROT & LISPRO (75-25 MIX) 100 UNIT/ML KWIKPEN: 3.0000 [IU] | PEN_INJECTOR | Freq: Two times a day (BID) | SUBCUTANEOUS | 0 refills | Status: DC

## 2021-02-18 MED ORDER — RYBELSUS 3 MG PO TABS: 3.0000 mg | ORAL_TABLET | Freq: Every day | ORAL | 3 refills | Status: DC

## 2021-02-18 MED ORDER — ACCU-CHEK GUIDE VI STRP: 1.0000 | ORAL_STRIP | Freq: Two times a day (BID) | 3 refills | Status: AC

## 2021-02-18 NOTE — Progress Notes (Signed)
Subjective:    Patient ID: Haley Bartlett, female    DOB: Feb 20, 1951, 70 y.o.   MRN: [id number]  HPI Husband translates (and also provides hx, due to pt's memory loss).  pt is referred by Jarrett Soho, NP, for diabetes.  Pt states DM was dx'ed in 1993; it is complicated by DR; she has been on insulin since 1997; pt says her diet and exercise are good; she has never had GDM, pancreatitis, pancreatic surgery, severe hypoglycemia or DKA.  She takes 7 units qam, and 5-6 units QPM.  husb says cbg decreased to 64 this AM.  I reviewed continuous glucose monitor data.  Glucose varies from 100-240.  It is in general highest at 12N and 8PM, but there is little trend throughout the day.   Past Medical History:  Diagnosis Date   Depression     Past Surgical History:  Procedure Laterality Date   ABDOMINAL HYSTERECTOMY     APPENDECTOMY     THYROIDECTOMY      Social History   Socioeconomic History   Marital status: Married    Spouse name: Oswaldo Done   Number of children: 2   Years of education: 12   Highest education level: Not on file  Occupational History   Occupation: retired  Tobacco Use   Smoking status: Never Smoker   Smokeless tobacco: Never Used  Substance and Sexual Activity   Alcohol use: No   Drug use: No   Sexual activity: Not on file  Other Topics Concern   Not on file  Social History Narrative   Lives with Oswaldo Done, husband   Caffeine use: Coffee daily   Social Determinants of Health   Financial Resource Strain: Not on file  Food Insecurity: Not on file  Transportation Needs: Not on file  Physical Activity: Not on file  Stress: Not on file  Social Connections: Not on file  Intimate Partner Violence: Not on file    Current Outpatient Medications on File Prior to Visit  Medication Sig Dispense Refill   cyanocobalamin (,VITAMIN B-12,) 1000 MCG/ML injection Inject 1,000 mcg into the muscle every Wednesday.      insulin lispro protamine-lispro  (HUMALOG 75/25 MIX) (75-25) 100 UNIT/ML SUSP injection Inject 12-14 Units into the skin See admin instructions. Inject 14 units in the morning and inject 12 units in the evening.     levothyroxine (SYNTHROID, LEVOTHROID) 25 MCG tablet Take 25 mcg by mouth daily before breakfast.     No current facility-administered medications on file prior to visit.    No Known Allergies  Family History  Problem Relation Age of Onset   Dementia Sister    Diabetes Sister    Diabetes Mother    Diabetes Brother     BP 110/60 (BP Location: Right Arm, Patient Position: Sitting, Cuff Size: Normal)    Pulse 81    Ht 4\' 11"  (1.499 m)    Wt 101 lb 6.4 oz (46 kg)    SpO2 99%    BMI 20.48 kg/m   Review of Systems denies weight loss, blurry vision, sob, memory loss, and depression.  Pt says metformin causes nausea.      Objective:   Physical Exam VITAL SIGNS:  See vs page GENERAL: no distress' Pulses: dorsalis pedis intact bilat.   MSK: no deformity of the feet CV: no leg edema Skin:  no ulcer on the feet.  normal color and temp on the feet. Neuro: sensation is intact to touch on the feet  Lab Results  Component Value Date   HGBA1C 6.9 (A) 02/18/2021   Lab Results  Component Value Date   CREATININE 0.86 10/23/2016   BUN 13 10/23/2016   NA 134 (L) 10/23/2016   K 4.3 10/23/2016   CL 101 10/23/2016   CO2 22 10/23/2016       Assessment & Plan:  Insulin-requiring type 2 DM, with DR: well-controlled.  goal is to d/c insulin.   Patient Instructions  good diet and exercise significantly improve the control of your diabetes.  please let me know if you wish to be referred to a dietician.  high blood sugar is very risky to your health.  you should see an eye doctor and dentist every year.  It is very important to get all recommended vaccinations.  Controlling your blood pressure and cholesterol drastically reduces the damage diabetes does to your body.  Those who smoke should quit.  Please  discuss these with your doctor.  check your blood sugar twice a day.  vary the time of day when you check, between before the 3 meals, and at bedtime.  also check if you have symptoms of your blood sugar being too high or too low.  please keep a record of the readings and bring it to your next appointment here (or you can bring the meter itself).  You can write it on any piece of paper.  please call us sooner if your blood sugar goes below 70, or if most of your readings are over 200. We will need to take this complex situation in stages I have sent 2 prescriptions to your pharmacy, to change the metformin to extended-release, and to add "Rybelsus." Please reduce the insulin to 3 units twice a day breakfast and supper. Please come back for a follow-up appointment in 2 months.    una buena dieta y ejercicio mejoran significativamente el control de su diabetes. por favor hgamelo saber si desea ser referido a un dietista. el nivel alto de azcar en la sangre es muy peligroso para su salud. usted debe ver a un Child psychotherapist y Occupational hygienist. Es muy importante obtener todas las vacunas recomendadas. Controlar la presin arterial y el colesterol reduce drsticamente el dao que la diabetes le causa a su cuerpo. Los que fuman deben dejar de Media planner. Hable de esto con su mdico. controle su nivel de azcar en la [employer] al da. Vare la hora del da en la que realiza la [place], entre antes de las 3 comidas y antes de [place]. tambin verifique si tiene sntomas de que su nivel de azcar en la sangre es demasiado alto o [place]. mantenga un registro de las lecturas y Nurse, adult a su prxima cita aqu (o puede traer Chief Executive Officer). Puedes escribirlo en cualquier hoja de papel. llmenos antes si su nivel de azcar en la sangre es inferior a 70 o si la mayora de sus lecturas superan los 200. Tendremos que tomar esta compleja situacin por etapas. He enviado 2 recetas a su farmacia, para cambiar la  metformina a liberacin prolongada y para agregar "Rybelsus". Reduzca la insulina a 3 [employer] al da en el desayuno y la [place]. Vuelva para una cita de seguimiento en 2 meses.

## 2021-02-18 NOTE — Patient Instructions (Addendum)
good diet and exercise significantly improve the control of your diabetes.  please let me know if you wish to be referred to a dietician.  high blood sugar is very risky to your health.  you should see an eye doctor and dentist every year.  It is very important to get all recommended vaccinations.  Controlling your blood pressure and cholesterol drastically reduces the damage diabetes does to your body.  Those who smoke should quit.  Please discuss these with your doctor.  check your blood sugar twice a day.  vary the time of day when you check, between before the 3 meals, and at bedtime.  also check if you have symptoms of your blood sugar being too high or too low.  please keep a record of the readings and bring it to your next appointment here (or you can bring the meter itself).  You can write it on any piece of paper.  please call us sooner if your blood sugar goes below 70, or if most of your readings are over 200. We will need to take this complex situation in stages I have sent 2 prescriptions to your pharmacy, to change the metformin to extended-release, and to add "Rybelsus." Please reduce the insulin to 3 units twice a day breakfast and supper. Please come back for a follow-up appointment in 2 months.    una buena dieta y ejercicio mejoran significativamente el control de su diabetes. por favor hgamelo saber si desea ser referido a un dietista. el nivel alto de azcar en la sangre es muy peligroso para su salud. usted debe ver a un Child psychotherapist y Occupational hygienist. Es muy importante obtener todas las vacunas recomendadas. Controlar la presin arterial y el colesterol reduce drsticamente el dao que la diabetes le causa a su cuerpo. Los que fuman deben dejar de Media planner. Hable de esto con su mdico. controle su nivel de azcar en la [employer] al da. Vare la hora del da en la que realiza la [place], entre antes de las 3 comidas y antes de [place]. tambin verifique si tiene sntomas de  que su nivel de azcar en la sangre es demasiado alto o [place]. mantenga un registro de las lecturas y Nurse, adult a su prxima cita aqu (o puede traer Chief Executive Officer). Puedes escribirlo en cualquier hoja de papel. llmenos antes si su nivel de azcar en la sangre es inferior a 70 o si la mayora de sus lecturas superan los 200. Tendremos que tomar esta compleja situacin por etapas. He enviado 2 recetas a su farmacia, para cambiar la metformina a liberacin prolongada y para agregar "Rybelsus". Reduzca la insulina a 3 [employer] al da en el desayuno y la [place]. Vuelva para una cita de seguimiento en 2 meses.

## 2021-02-19 ENCOUNTER — Telehealth: Payer: Self-pay | Admitting: Endocrinology

## 2021-02-19 NOTE — Telephone Encounter (Signed)
NA

## 2021-02-20 ENCOUNTER — Encounter: Payer: Self-pay | Admitting: Endocrinology

## 2021-02-22 MED ORDER — REPAGLINIDE 1 MG PO TABS: 1.0000 mg | ORAL_TABLET | Freq: Two times a day (BID) | ORAL | 3 refills | Status: DC

## 2021-03-13 ENCOUNTER — Telehealth: Payer: Self-pay

## 2021-03-13 NOTE — Telephone Encounter (Signed)
Sent an internal fax of the pt's last OV to [employer]@ [phone]

## 2021-03-13 NOTE — Telephone Encounter (Signed)
[facility] calling asking for OV notes from patients visit on 02/18/2021   Please advise   Fax number is [phone] Attn: [place]

## 2021-03-15 NOTE — Telephone Encounter (Signed)
Internal fax to [facility] to [phone]  with OV notes from 02/18/2021

## 2021-03-24 ENCOUNTER — Other Ambulatory Visit: Payer: Self-pay | Admitting: Endocrinology

## 2021-04-01 ENCOUNTER — Encounter: Payer: Self-pay | Admitting: Neurology

## 2021-04-03 ENCOUNTER — Telehealth: Payer: Self-pay | Admitting: Endocrinology

## 2021-04-03 NOTE — Telephone Encounter (Signed)
[facility] is calling to see if Dr.Ellison could reevaluate Pt  And to see if pt can start taking statin  [facility] would call back regarding this matter.

## 2021-04-22 ENCOUNTER — Ambulatory Visit: Payer: Medicare Other | Admitting: Endocrinology

## 2021-04-22 ENCOUNTER — Other Ambulatory Visit: Payer: Self-pay

## 2021-04-22 VITALS — BP 122/62 | HR 85 | Ht 59.0 in | Wt 95.8 lb

## 2021-04-22 DIAGNOSIS — E1369 Other specified diabetes mellitus with other specified complication: Secondary | ICD-10-CM

## 2021-04-22 DIAGNOSIS — E119 Type 2 diabetes mellitus without complications: Secondary | ICD-10-CM

## 2021-04-22 DIAGNOSIS — Z794 Long term (current) use of insulin: Secondary | ICD-10-CM | POA: Diagnosis not present

## 2021-04-22 LAB — POCT GLYCOSYLATED HEMOGLOBIN (HGB A1C): Hemoglobin A1C: 6.9 % — AB (ref 4.0–5.6)

## 2021-04-22 NOTE — Patient Instructions (Addendum)
check your blood sugar twice a day.  vary the time of day when you check, between before the 3 meals, and at bedtime.  also check if you have symptoms of your blood sugar being too high or too low.  please keep a record of the readings and bring it to your next appointment here (or you can bring the meter itself).  You can write it on any piece of paper.  please call us sooner if your blood sugar goes below 70, or if most of your readings are over 200. Please continue the same 2 diabetes medications.   Please come back for a follow-up appointment in 4 months.

## 2021-04-22 NOTE — Progress Notes (Signed)
Subjective:    Patient ID: Haley Bartlett, female    DOB: Dec 06, 1951, 70 y.o.   MRN: [id number]  HPI Pt returns for f/u of diabetes mellitus: DM type: 2 Dx'ed: 1993 Complications: DR Therapy: insulin since 1997, and metformin GDM: never DKA: never Severe hypoglycemia: never Pancreatitis: never Pancreatic imaging: normal on 2012 CT SDOH: Husband translates (and also provides hx, due to pt's memory loss) Other: she takes multiple daily injections, but goal is to d/c insulin.   Interval history: She stopped insulin 6 weeks ago.  pt states she feels well in general.  She could not afford rybelsus.  I reviewed continuous glucose monitor data.  Glucose varies from 100-260.  It is in general highest at 9AM and 7PM-11PM.  Past Medical History:  Diagnosis Date  . Depression     Past Surgical History:  Procedure Laterality Date  . ABDOMINAL HYSTERECTOMY    . APPENDECTOMY    . THYROIDECTOMY      Social History   Socioeconomic History  . Marital status: Married    Spouse name: Oswaldo Done  . Number of children: 2  . Years of education: 63  . Highest education level: Not on file  Occupational History  . Occupation: retired  Tobacco Use  . Smoking status: Never Smoker  . Smokeless tobacco: Never Used  Substance and Sexual Activity  . Alcohol use: No  . Drug use: No  . Sexual activity: Not on file  Other Topics Concern  . Not on file  Social History Narrative   Lives with Oswaldo Done, husband   Caffeine use: Coffee daily   Social Determinants of Health   Financial Resource Strain: Not on file  Food Insecurity: Not on file  Transportation Needs: Not on file  Physical Activity: Not on file  Stress: Not on file  Social Connections: Not on file  Intimate Partner Violence: Not on file    Current Outpatient Medications on File Prior to Visit  Medication Sig Dispense Refill  . cyanocobalamin (,VITAMIN B-12,) 1000 MCG/ML injection Inject 1,000 mcg into the muscle every  Wednesday.     Marland Kitchen glucose blood (ACCU-CHEK GUIDE) test strip 1 each by Other route 2 (two) times daily. And lancets 2/day 200 each 3  . levothyroxine (SYNTHROID, LEVOTHROID) 25 MCG tablet Take 25 mcg by mouth daily before breakfast.    . metFORMIN (GLUCOPHAGE-XR) 500 MG 24 hr tablet Take 4 tablets (2,000 mg total) by mouth daily. 360 tablet 3  . repaglinide (PRANDIN) 1 MG tablet Take 1 tablet (1 mg total) by mouth 2 (two) times daily before a meal. 180 tablet 3   No current facility-administered medications on file prior to visit.    No Known Allergies  Family History  Problem Relation Age of Onset  . Dementia Sister   . Diabetes Sister   . Diabetes Mother   . Diabetes Brother     BP 122/62 (BP Location: Right Arm, Patient Position: Sitting, Cuff Size: Normal)   Pulse 85   Ht 4\' 11"  (1.499 m)   Wt 95 lb 12.8 oz (43.5 kg)   SpO2 97%   BMI 19.35 kg/m    Review of Systems Denies n/v/heartburn.      Objective:   Physical Exam VITAL SIGNS:  See vs page GENERAL: no distress Pulses: dorsalis pedis intact bilat.   MSK: no deformity of the feet CV: no leg edema Skin:  no ulcer on the feet.  normal color and temp on the feet. Neuro: sensation is  intact to touch on the feet   A1c=6.9%    Assessment & Plan:  Insulin-requiring type 2 DM: well-controlled off insulin.  Patient Instructions  check your blood sugar twice a day.  vary the time of day when you check, between before the 3 meals, and at bedtime.  also check if you have symptoms of your blood sugar being too high or too low.  please keep a record of the readings and bring it to your next appointment here (or you can bring the meter itself).  You can write it on any piece of paper.  please call us sooner if your blood sugar goes below 70, or if most of your readings are over 200. Please continue the same 2 diabetes medications.   Please come back for a follow-up appointment in 4 months.

## 2021-04-26 DIAGNOSIS — E119 Type 2 diabetes mellitus without complications: Secondary | ICD-10-CM | POA: Insufficient documentation

## 2021-04-26 DIAGNOSIS — E11319 Type 2 diabetes mellitus with unspecified diabetic retinopathy without macular edema: Secondary | ICD-10-CM | POA: Insufficient documentation

## 2021-06-02 ENCOUNTER — Telehealth: Payer: Self-pay | Admitting: Endocrinology

## 2021-06-02 NOTE — Telephone Encounter (Signed)
Patient's spouse called to advise that the mail order pharmacy is not going to be able to get patients medication to them until Friday 06/06/21.    Patient requesting a GAP RX for Repaglinide 1 MG - only needs enough 1 week  [facility] on [address] as a priority because patient is out of medication

## 2021-06-03 ENCOUNTER — Other Ambulatory Visit: Payer: Self-pay

## 2021-06-03 MED ORDER — REPAGLINIDE 1 MG PO TABS: 1.0000 mg | ORAL_TABLET | Freq: Two times a day (BID) | ORAL | 0 refills | Status: DC

## 2021-06-03 NOTE — Telephone Encounter (Signed)
Patient spouse called again about getting medication sent to [facility] - Patient is out and is experiencing high blood sugars - 400-500's right now

## 2021-06-03 NOTE — Telephone Encounter (Signed)
Rx sent for 7 days to preferred pharmacy.

## 2021-06-03 NOTE — Telephone Encounter (Signed)
Patient is calling following up on refill request. Patient is out of medication.

## 2021-06-23 ENCOUNTER — Ambulatory Visit: Payer: Medicare Other | Admitting: Neurology

## 2021-06-30 ENCOUNTER — Other Ambulatory Visit (INDEPENDENT_AMBULATORY_CARE_PROVIDER_SITE_OTHER): Payer: Medicare Other

## 2021-06-30 ENCOUNTER — Other Ambulatory Visit: Payer: Self-pay

## 2021-06-30 ENCOUNTER — Ambulatory Visit: Payer: Medicare Other | Admitting: Neurology

## 2021-06-30 ENCOUNTER — Encounter: Payer: Self-pay | Admitting: Neurology

## 2021-06-30 VITALS — BP 130/76 | HR 85 | Ht 59.0 in | Wt 96.4 lb

## 2021-06-30 DIAGNOSIS — R413 Other amnesia: Secondary | ICD-10-CM

## 2021-06-30 MED ORDER — DONEPEZIL HCL 10 MG PO TABS: ORAL_TABLET | ORAL | 11 refills | Status: DC

## 2021-06-30 NOTE — Progress Notes (Signed)
NEUROLOGY CONSULTATION NOTE  Haley Bartlett MRN: [id number] DOB: 07-03-1951  Referring provider: Dr. Romero Belling Primary care provider: Dr. Juluis Rainier  Reason for consult:  memory loss  Dear Dr Everardo All:  Thank you for your kind referral of Haley Bartlett for consultation of the above symptoms. Although her history is well known to you, please allow me to reiterate it for the purpose of our medical record. The patient was accompanied to the clinic by her daughter Haley Bartlett who also provides collateral information. A Spanish medical interpreter helped with translation. Records and images were personally reviewed where available.   HISTORY OF PRESENT ILLNESS: This is a 70 year old right-handed woman with a history of diabetes, neuropathy, B12 deficiency, anxiety, presenting for evaluation of memory loss. She was previously evaluated by neurologist Dr. Lucia Gaskins in 2017 for similar concerns. She has a family history of Alzheimer's disease in her sister who presented at age 34 and died at age 85. At that time, her husband reported noticing memory changes starting around 2014, she was getting lost driving, forgetting names of coworkers she had worked with for many years. She was repeating herself, losing things, forgetting things on the stove or ingredients while cooking. At that time, she endorsed depression, crying most days. She had a brain MRI without contrast in 11/2016 which I personally reviewed, no acute changes, brain volume normal for age, mild chronic microvascular disease.  She states her memory is "bad." She lives with her husband and Haley Bartlett. Haley Bartlett reports the memory changes in 2017 were "a little, not too bad," but have progressively gotten worse over the years. This morning she lost her coffee cup and could not recall if she drank coffee or where she put her cup. She is very forgetful. She has left the stove on. She drives very minimally, her family drives her places.  She has always been bad with directions, but even when she crosses the street she does not look around, worse recently. She manages her own medications, Haley Bartlett checks behind and states she does pretty good with this. Her husband manages finances. She misplaces things frequently. Haley Bartlett notes sometimes she seems lost, there is a delay when answering questions like she has a hard time processing information. She had a hearing evaluation and it was not so bad. She used to sew really well, sewing wedding dresses, and now is unable to. She babysits her 4yo and 9yo grandchildren. She is also moody and argues with her husband, believing things a certain way and very adamant about it even if she is wrong. She understands conversations to be different. No hallucinations. She states she has not been feeling well lately because of headaches. Headaches started a year ago, with pain in the back of her head occurring once a week. There is no nausea/vomiting, she does not take prn medication. She denies any diplopia, dysarthria/dysphagia, bowel dysfunction. She has neuropathy in her hands and feet and takes gabapentin 100mg  qhs. She has some urinary leakage when she does her walking. Sense of taste and smell are not good. She has occasional hand tremors when eating and writing. She does not sleep well, usually with 4-5 hours of sleep. She does not take naps. She had a fall in April and May with a lot of leg cramps. She reports her glucose levels go up and down, especially at night. Her paternal grandmother had Alzheimer's disease, as well as her older sister as noted above. No history of significant head injuries or  alcohol use.      Lab Results  Component Value Date   HGBA1C 6.9 (A) 04/22/2021    PAST MEDICAL HISTORY: Past Medical History:  Diagnosis Date   Depression     PAST SURGICAL HISTORY: Past Surgical History:  Procedure Laterality Date   ABDOMINAL HYSTERECTOMY     APPENDECTOMY     THYROIDECTOMY       MEDICATIONS: Current Outpatient Medications on File Prior to Visit  Medication Sig Dispense Refill   gabapentin (NEURONTIN) 100 MG capsule Take 100 mg by mouth at bedtime. 1.5 at bed time     glucose blood (ACCU-CHEK GUIDE) test strip 1 each by Other route 2 (two) times daily. And lancets 2/day 200 each 3   levothyroxine (SYNTHROID, LEVOTHROID) 25 MCG tablet Take 25 mcg by mouth daily before breakfast.     metFORMIN (GLUCOPHAGE-XR) 500 MG 24 hr tablet Take 4 tablets (2,000 mg total) by mouth daily. 360 tablet 3   repaglinide (PRANDIN) 1 MG tablet Take 1 tablet (1 mg total) by mouth 2 (two) times daily before a meal. 14 tablet 0   sertraline (ZOLOFT) 25 MG tablet Take 25 mg by mouth daily. 1.5 tab daily     No current facility-administered medications on file prior to visit.    ALLERGIES: No Known Allergies  FAMILY HISTORY: Family History  Problem Relation Age of Onset   Dementia Sister    Diabetes Sister    Diabetes Mother    Diabetes Brother     SOCIAL HISTORY: Social History   Socioeconomic History   Marital status: Married    Spouse name: Geographical information systems officer   Number of children: 2   Years of education: 12   Highest education level: Not on file  Occupational History   Occupation: retired  Tobacco Use   Smoking status: Never   Smokeless tobacco: Never  Vaping Use   Vaping Use: Never used  Substance and Sexual Activity   Alcohol use: No   Drug use: No   Sexual activity: Not on file  Other Topics Concern   Not on file  Social History Narrative   Lives with Oswaldo Done, husband   Caffeine use: Coffee daily   Right handed    Social Determinants of Health   Financial Resource Strain: Not on file  Food Insecurity: Not on file  Transportation Needs: Not on file  Physical Activity: Not on file  Stress: Not on file  Social Connections: Not on file  Intimate Partner Violence: Not on file     PHYSICAL EXAM: Vitals:   06/30/21 1039  BP: 130/76  Pulse: 85  SpO2: 99%    General: No acute distress Head:  Normocephalic/atraumatic Skin/Extremities: No rash, no edema Neurological Exam: Mental status: alert and oriented to person, place, and time, no dysarthria or aphasia, Fund of knowledge is reduced.  Recent and remote memory are impaired.  Attention and concentration are reduced.Difficulty with naming and repetition. [facility] 10/30 Montreal Cognitive Assessment  06/30/2021  Visuospatial/ Executive (0/5) 1  Naming (0/3) 1  Attention: Read list of digits (0/2) 0  Attention: Read list of letters (0/1) 0  Attention: Serial 7 subtraction starting at 100 (0/3) 1  Language: Repeat phrase (0/2) 0  Language : Fluency (0/1) 0  Abstraction (0/2) 1  Delayed Recall (0/5) 0  Orientation (0/6) 5  Total 9  Adjusted Score (based on education) 10    Cranial nerves: CN I: not tested CN II: pupils equal, round and reactive to light, visual fields intact  CN III, IV, VI:  full range of motion, no nystagmus, no ptosis CN V: facial sensation intact CN VII: upper and lower face symmetric CN VIII: hearing intact to conversation CN XI: sternocleidomastoid and trapezius muscles intact CN XII: tongue midline Bulk & Tone: normal, no cogwheeling, no fasciculations. Motor: 5/5 throughout with no pronator drift. Sensation: intact to light touch, cold, pin, vibration sense.  No extinction to double simultaneous stimulation.  Romberg test negative Deep Tendon Reflexes: brisk +2 throughout Cerebellar: no incoordination on finger to nose testing Gait: narrow-based and steady, able to tandem walk adequately. 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 There is a family history of early onset dementia in her sister. MOCA score today 10/30. Repeat MRI brain with and without contrast will be  ordered to assess for underlying structural abnormality and assess vascular load. Check B12. We discussed starting Donepezil, including side effects and expectations. Start Donepezil 10mg  1/2 tablet daily for 2 weeks, then increase to 1 tablet daily. Her daughter is asking about CSF studies, we may consider this on next visit, however we discussed this would not change management at this time. We discussed the importance of control of vascular risk factors, physical and brain stimulation exercises, and MIND diet for overall brain health. Continue close supervision. Follow-up in 3 months, call for any changes.    Thank you for allowing me to participate in the care of this patient. Please do not hesitate to call for any questions or concerns.   , M.D.  CC: Dr. Patrcia Dolly, Dr. Everardo All

## 2021-06-30 NOTE — Patient Instructions (Addendum)
1. Anlisis de sangre para el nivel de B12  2. Programe la resonancia magntica cerebral con y sin contraste  3. Comience con Donepezil (Aricept) 10 mg: tome 1/2 tableta al da durante 2 semanas, luego aumente a 1 tableta al da  4. Seguimiento en 3 meses, llame para cualquier cambio   PRECAUCIONES DE CADA: Tenga cuidado al caminar. Escanee el rea en busca de obstculos que puedan aumentar el riesgo de tropiezos y cadas. Al levantarse por las maanas, sintese en el borde de la cama durante unos minutos antes de levantarse de la cama. Considere elevar la cama en la cabecera para evitar la cada de la presin arterial al levantarse. Camine siempre en una habitacin bien iluminada (utilice luces nocturnas en las paredes). Evite las alfombras o los cables elctricos de los electrodomsticos en el medio de los pasillos. Use un andador o un bastn si es necesario y considere la fisioterapia para el ejercicio del equilibrio. Hgase revisar la vista peridicamente.  SEGURIDAD EN EL HOGAR: Considere la seguridad de la cocina cuando opere electrodomsticos como estufas, hornos de microondas y licuadoras. Considere tener supervisin y Agricultural consultant las responsabilidades de cocinar hasta que ya no pueda participar en ellas. Los accidentes con armas de fuego y otros peligros en la casa tambin deben identificarse y abordarse.  CONDUCCIN: Respecto a la conduccin, en pacientes con problemas progresivos de memoria, la conduccin se ver perjudicada. Recomendamos que alguien ms conduzca si hay problemas para encontrar direcciones o si se informan accidentes menores. La evaluacin de conduccin independiente est disponible para determinar la seguridad de la conduccin.  HABILIDAD PARA QUEDAR SOLO: Si el paciente no puede comunicarse con el operador del 911, considere usar [facility], o cuando sea necesario, haga arreglos para que alguien se quede con los [place]. Fumar es un peligro de incendio, considere la  supervisin o Passenger transport manager. El cuidador debe evaluar el riesgo de deambular y, si se detecta en algn momento, se deben instituir recomendaciones de supervisin y pruebas seguras.  SUPERVISIN DE MEDICAMENTOS: La incapacidad para autoadministrarse medicamentos debe abordarse constantemente. Implementar un mecanismo para garantizar la administracin segura de los medicamentos.  RECOMENDACIONES PARA TODOS LOS PACIENTES CON PROBLEMAS DE MEMORIA: 1. Contine haciendo ejercicio (se recomiendan 30 minutos de caminata todos los [address] o 3 horas cada semana) 2. Aumente las interacciones sociales: contine yendo a la [place] y disfrute de las reuniones sociales con amigos y familiares. 3. Come sano, evita las frituras y come ms frutas y verduras 4. Mantener niveles adecuados de presin arterial, azcar en la sangre y colesterol en la sangre. Reducir el riesgo de accidente cerebrovascular y enfermedad cardiovascular tambin ayuda a promover Art gallery manager. 5. Evite situaciones estresantes. Vive una vida sencilla y evita las [place]. Organice su tiempo y preprese para el da siguiente con anticipacin. 6. Duerma bien, evite cualquier interrupcin del sueo y evite cualquier distraccin en el dormitorio que pueda interferir con la calidad [place] del sueo. 7. Evite el azcar, evite los dulces, ya que existe un fuerte vnculo entre el consumo excesivo de International aid/development worker, la diabetes y Nurse, learning disability cognitivo Hablamos de la dieta [place], que se ha demostrado que ayuda a los pacientes a reducir el riesgo de trastornos progresivos de la memoria y reduce el riesgo cardiovascular. Esto incluye comer pescado, comer frutas y verduras de [employer], nueces como almendras y [place], nueces y [place] usar aceite de [place]. Evite las comidas rpidas y los alimentos fritos tanto como sea posible. Evite los dulces y Insurance claims handler, ya  que el uso de azcar se ha relacionado con el empeoramiento de la funcin de la [place].  Siempre  existe la preocupacin de la progresin gradual de los problemas de [place]. Si este es [address], es posible que debamos ajustar el nivel de atencin de acuerdo con las necesidades del [place]. A continuacin, se debe poner en marcha el apoyo, tanto para el paciente como para Public librarian.   Dieta mediterrnea  Por qu seguirlo? [employer].  Aquellos que siguen la dieta mediterrnea tienen un riesgo reducido de enfermedades del corazn  La dieta se asocia con una incidencia reducida de las enfermedades de Parkinson y Alzheimer  Las personas que siguen la dieta pueden tener una esperanza de vida ms larga y tasas ms bajas de enfermedades crnicas  Las Guas Alimentarias para Estadounidenses recomiendan la dieta mediterrnea como un plan de alimentacin para promover la salud y prevenir enfermedades  Qu es la dieta mediterrnea?  Plan de alimentacin saludable basado en comidas y recetas tpicas de la cocina mediterrnea  La dieta es principalmente una dieta basada en plantas; estos alimentos deben constituir la [employer] de las comidas  Almidones: los alimentos de origen vegetal deben constituir la [place] de las comidas - Son una fuente importante de vitaminas, [place], [place], antioxidantes y [place]. - Elija cereales integrales, alimentos ricos en fibra y artculos mnimamente procesados - Las fuentes tpicas de granos incluyen [place], [place], [place], maz, arroz integral, bulgar, farro, mijo, polenta, cuscs - Varios tipos de frijoles incluyen garbanzos, lentejas, habas, frijoles negros, frijoles blancos Frutas Verduras - Grandes cantidades de frutas y verduras ricas en antioxidantes; 6 o ms porciones - Las verduras se pueden comer crudas o ligeramente rociadas con aceite y cocidas - Las verduras comunes a la dieta mediterrnea tradicional incluyen: alcachofas, rcula, remolacha, brcoli, coles de [place]as, repollo, zanahorias, apio, col rizada, pepinos, berenjenas, col rizada,  puerros, limones, l       1.Bloodwork for B12 level  2. Schedule MRI brain with and without contrast  3. Start Donepezil (Aricept) 10mg : take 1/2 tablet daily for 2 weeks, then increase to 1 tablet daily  4. Follow-up in 3 months, call for any changes   FALL PRECAUTIONS: Be cautious when walking. Scan the area for obstacles that may increase the risk of trips and falls. When getting up in the mornings, sit up at the edge of the bed for a few minutes before getting out of bed. Consider elevating the bed at the head end to avoid drop of blood pressure when getting up. Walk always in a well-lit room (use night lights in the walls). Avoid area rugs or power cords from appliances in the middle of the walkways. Use a walker or a cane if necessary and consider physical therapy for balance exercise. Get your eyesight checked regularly.  HOME SAFETY: Consider the safety of the kitchen when operating appliances like stoves, microwave oven, and blender. Consider having supervision and share cooking responsibilities until no longer able to participate in those. Accidents with firearms and other hazards in the house should be identified and addressed as well.  DRIVING: Regarding driving, in patients with progressive memory problems, driving will be impaired. We advise to have someone else do the driving if trouble finding directions or if minor accidents are reported. Independent driving assessment is available to determine safety of driving.  ABILITY TO BE LEFT ALONE: If patient is unable to contact 911 operator, consider using [facility], or when the need is there, arrange for someone to stay  with patients. Smoking is a fire hazard, consider supervision or cessation. Risk of wandering should be assessed by caregiver and if detected at any point, supervision and safe proof recommendations should be instituted.  MEDICATION SUPERVISION: Inability to self-administer medication needs to be constantly  addressed. Implement a mechanism to ensure safe administration of the medications.  RECOMMENDATIONS FOR ALL PATIENTS WITH MEMORY PROBLEMS: 1. Continue to exercise (Recommend 30 minutes of walking everyday, or 3 hours every week) 2. Increase social interactions - continue going to [place] and enjoy social gatherings with friends and family 3. Eat healthy, avoid fried foods and eat more fruits and vegetables 4. Maintain adequate blood pressure, blood sugar, and blood cholesterol level. Reducing the risk of stroke and cardiovascular disease also helps promoting better memory. 5. Avoid stressful situations. Live a simple life and avoid aggravations. Organize your time and prepare for the next day in anticipation. 6. Sleep well, avoid any interruptions of sleep and avoid any distractions in the bedroom that may interfere with adequate sleep quality 7. Avoid sugar, avoid sweets as there is a strong link between excessive sugar intake, diabetes, and cognitive impairment We discussed the Mediterranean diet, which has been shown to help patients reduce the risk of progressive memory disorders and reduces cardiovascular risk. This includes eating fish, eat fruits and green leafy vegetables, nuts like almonds and hazelnuts, walnuts, and also use olive oil. Avoid fast foods and fried foods as much as possible. Avoid sweets and sugar as sugar use has been linked to worsening of memory function.  There is always a concern of gradual progression of memory problems. If this is the case, then we may need to adjust level of care according to patient needs. Support, both to the patient and caregiver, should then be put into place.       Mediterranean Diet  Why follow it? Research shows. Those who follow the Mediterranean diet have a reduced risk of heart disease  The diet is associated with a reduced incidence of Parkinson's and Alzheimer's diseases People following the diet may have longer life expectancies and  lower rates of chronic diseases  The Dietary Guidelines for Americans recommends the Mediterranean diet as an eating plan to promote health and prevent disease  What Is the Mediterranean Diet?  Healthy eating plan based on typical foods and recipes of Mediterranean-style cooking The diet is primarily a plant based diet; these foods should make up a majority of meals   Starches - Plant based foods should make up a majority of meals - They are an important sources of vitamins, minerals, energy, antioxidants, and fiber - Choose whole grains, foods high in fiber and minimally processed items  - Typical grain sources include wheat, oats, barley, corn, brown rice, bulgar, farro, millet, polenta, couscous  - Various types of beans include chickpeas, lentils, fava beans, black beans, white beans   Fruits  Veggies - Large quantities of antioxidant rich fruits & veggies; 6 or more servings  - Vegetables can be eaten raw or lightly drizzled with oil and cooked  - Vegetables common to the traditional Mediterranean Diet include: artichokes, arugula, beets, broccoli, brussel sprouts, cabbage, carrots, celery, collard greens, cucumbers, eggplant, kale, leeks, lemons, lettuce, mushrooms, okra, onions, peas, peppers, potatoes, pumpkin, radishes, rutabaga, shallots, spinach, sweet potatoes, turnips, zucchini - Fruits common to the Mediterranean Diet include: apples, apricots, avocados, cherries, clementines, dates, figs, grapefruits, grapes, melons, nectarines, oranges, peaches, pears, pomegranates, strawberries, tangerines  Fats - Replace butter and margarine with healthy oils,  such as olive oil, canola oil, and tahini  - Limit nuts to no more than a handful a day  - Nuts include walnuts, almonds, pecans, pistachios, pine nuts  - Limit or avoid candied, honey roasted or heavily salted nuts - Olives are central to the Mediterranean diet - can be eaten whole or used in a variety of dishes   Meats Protein -  Limiting red meat: no more than a few times a month - When eating red meat: choose lean cuts and keep the portion to the size of deck of cards - Eggs: approx. 0 to 4 times a week  - Fish and lean poultry: at least 2 a week  - Healthy protein sources include, chicken, [place], lean beef, lamb - Increase intake of seafood such as tuna, salmon, trout, mackerel, shrimp, scallops - Avoid or limit high fat processed meats such as sausage and bacon  Dairy - Include moderate amounts of low fat dairy products  - Focus on healthy dairy such as fat free yogurt, skim milk, low or reduced fat cheese - Limit dairy products higher in fat such as whole or 2% milk, cheese, ice cream  Alcohol - Moderate amounts of red wine is ok  - No more than 5 oz daily for women (all ages) and men older than age 74  - No more than 10 oz of wine daily for men younger than 31  Other - Limit sweets and other desserts  - Use herbs and spices instead of salt to flavor foods  - Herbs and spices common to the traditional Mediterranean Diet include: basil, bay leaves, chives, cloves, cumin, fennel, garlic, lavender, marjoram, mint, oregano, parsley, pepper, rosemary, sage, savory, sumac, tarragon, thyme   It's not just a diet, it's a lifestyle:  The Mediterranean diet includes lifestyle factors typical of those in the region  Foods, drinks and meals are best eaten with others and savored Daily physical activity is important for overall good health This could be strenuous exercise like running and aerobics This could also be more leisurely activities such as walking, housework, yard-work, or taking the stairs Moderation is the key; a balanced and healthy diet accommodates most foods and drinks Consider portion sizes and frequency of consumption of certain foods   Meal Ideas & Options:  Breakfast:  Whole wheat toast or whole wheat English muffins with peanut butter & hard boiled egg Steel cut oats topped with apples & cinnamon  and skim milk  Fresh fruit: banana, strawberries, melon, berries, peaches  Smoothies: strawberries, bananas, greek yogurt, peanut butter Low fat greek yogurt with blueberries and granola  Egg white omelet with spinach and mushrooms Breakfast couscous: whole wheat couscous, apricots, skim milk, cranberries  Sandwiches:  Hummus and grilled vegetables (peppers, zucchini, squash) on whole wheat bread   Grilled chicken on whole wheat pita with lettuce, tomatoes, cucumbers or tzatziki  [place] salad on whole wheat bread: tuna salad made with greek yogurt, olives, red peppers, capers, green onions Garlic rosemary lamb pita: lamb sauted with garlic, rosemary, salt & pepper; add lettuce, cucumber, greek yogurt to pita - flavor with lemon juice and black pepper  Seafood:  Mediterranean grilled salmon, seasoned with garlic, basil, parsley, lemon juice and black pepper Shrimp, lemon, and spinach whole-grain pasta salad made with low fat greek yogurt  Seared scallops with lemon orzo  Seared tuna steaks seasoned salt, pepper, coriander topped with tomato mixture of olives, tomatoes, olive oil, minced garlic, parsley, green onions and cappers  Meats:  Herbed greek chicken salad with kalamata olives, cucumber, feta  Red bell peppers stuffed with spinach, bulgur, lean ground beef (or lentils) & topped with feta   Kebabs: skewers of chicken, tomatoes, onions, zucchini, squash  [place] burgers: made with red onions, mint, dill, lemon juice, feta cheese topped with roasted red peppers Vegetarian Cucumber salad: cucumbers, artichoke hearts, celery, red onion, feta cheese, tossed in olive oil & lemon juice  Hummus and whole grain pita points with a greek salad (lettuce, tomato, feta, olives, cucumbers, red onion) Lentil soup with celery, carrots made with vegetable broth, garlic, salt and pepper  Tabouli salad: parsley, bulgur, mint, scallions, cucumbers, tomato, radishes, lemon juice, olive oil, salt and  pepper.

## 2021-07-01 LAB — VITAMIN B12: Vitamin B-12: 913 pg/mL (ref 232–1245)

## 2021-07-03 ENCOUNTER — Telehealth: Payer: Self-pay

## 2021-07-03 NOTE — Telephone Encounter (Signed)
-----   Message from Van Clines, MD sent at 07/01/2021  8:42 AM EDT ----- Pls let daughter know B12 level is normal, thanks

## 2021-07-03 NOTE — Telephone Encounter (Signed)
Pt called and informed that b12 is normal

## 2021-07-04 ENCOUNTER — Other Ambulatory Visit: Payer: Self-pay

## 2021-07-04 ENCOUNTER — Ambulatory Visit
Admission: RE | Admit: 2021-07-04 | Discharge: 2021-07-04 | Disposition: A | Discharge status: Home / Self Care | Payer: Medicare Other | Source: Ambulatory Visit | Attending: Neurology | Admitting: Neurology

## 2021-07-04 DIAGNOSIS — R413 Other amnesia: Secondary | ICD-10-CM

## 2021-07-04 IMAGING — MR MR HEAD WO/W CM
13 series · 48 of 48 positions shown · IV contrast (multihance)
Comparison: MR head without contrast [DATE]

CLINICAL DATA: Memory loss over the last 2 months.

EXAM:
MRI HEAD WITHOUT AND WITH CONTRAST
TECHNIQUE: Multiplanar, multiecho pulse sequences of the brain and surrounding
structures were obtained without and with intravenous contrast.
CONTRAST:  9mL MULTIHANCE GADOBENATE DIMEGLUMINE 529 MG/ML IV SOLN

[Series 2: T1 · sagittal · 5.0mm · 0.45mm/px · 2 of 25 slices shown]
[im 1/25]
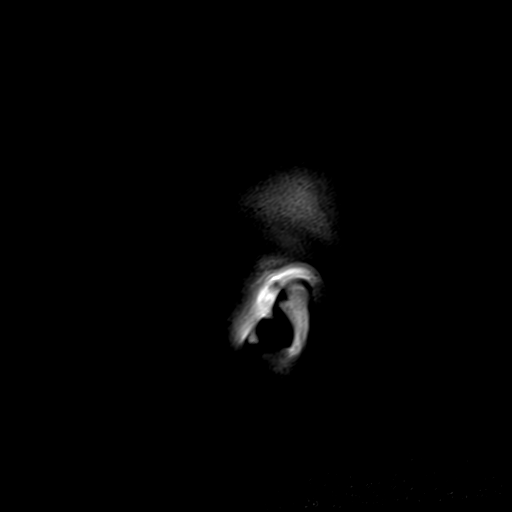
[im 25/25]
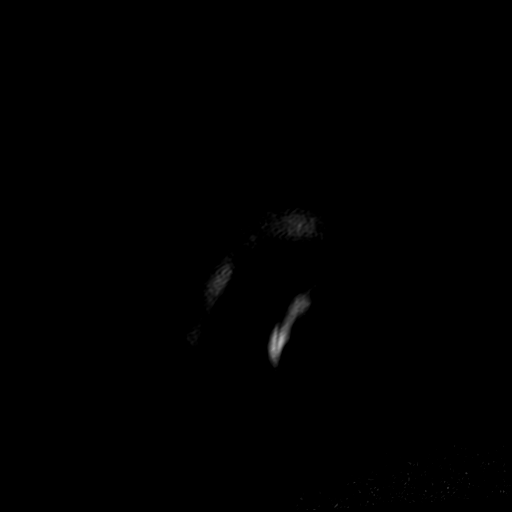

[Series 3: ax ep2d_diff_3 · axial · 3.0mm · 1.80mm/px · z∈[-55,+101]mm · 6 of 102 slices shown]
[im 1/102]
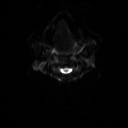
[im 21/102]
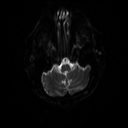
[im 41/102]
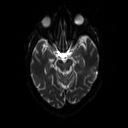
[im 61/102]
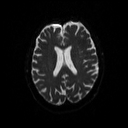
[im 81/102]
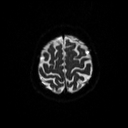
[im 102/102]
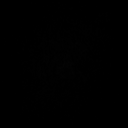

[Series 4: ax ep2d_diff_3_adc · axial · 3.0mm · 1.80mm/px · z∈[-55,+107]mm · 3 of 55 slices shown]
[im 1/55]
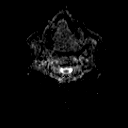
[im 28/55]
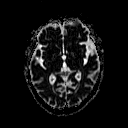
[im 55/55]
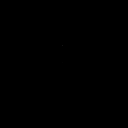

[Series 5: cor ep2d_diff · coronal · 5.0mm · 1.77mm/px · 3 of 56 slices shown]
[im 1/56]
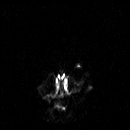
[im 28/56]
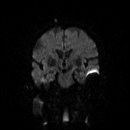
[im 56/56]
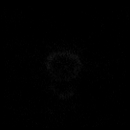

[Series 6: cor ep2d_diff_adc · coronal · 5.0mm · 1.77mm/px · 2 of 28 slices shown]
[im 1/28]
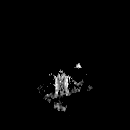
[im 28/28]
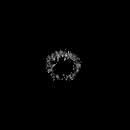

[Series 8: swi_images · axial · 2.0mm · 0.98mm/px · z∈[-52,+105]mm · 5 of 80 slices shown]
[im 1/80]
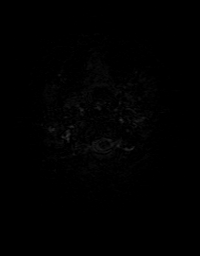
[im 20/80]
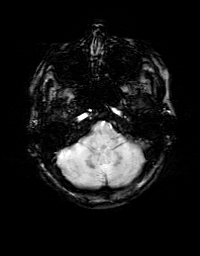
[im 40/80]
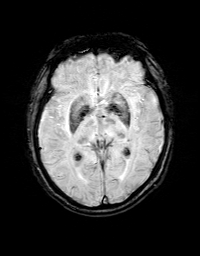
[im 60/80]
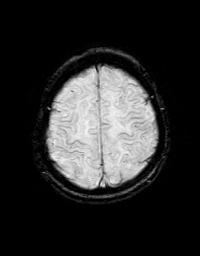
[im 80/80]
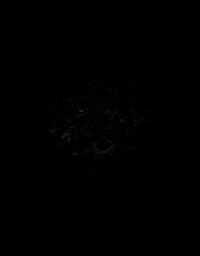

[Series 9: FLAIR · axial · 3.0mm · 0.43mm/px · 1 of 20 slices shown (1 of 2)]
[im 1/20]
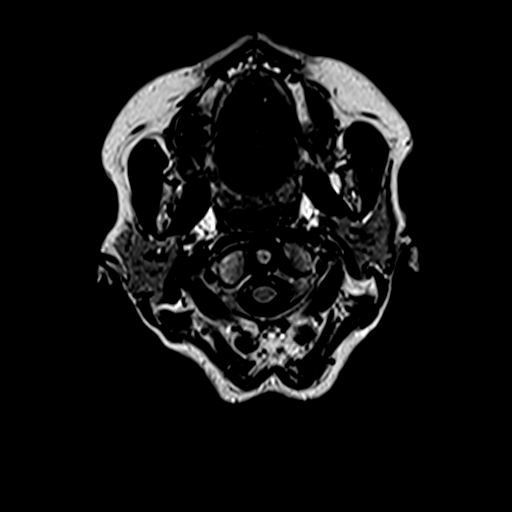

[Series 10: T2 · axial · 5.0mm · 0.65mm/px · z∈[-57,+110]mm · 2 of 29 slices shown (1 of 2)]
[im 1/29]
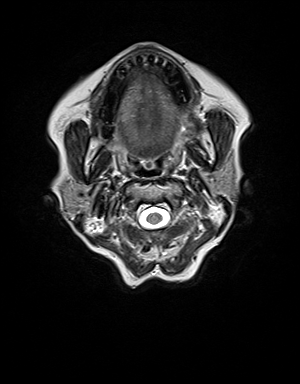
[im 29/29]
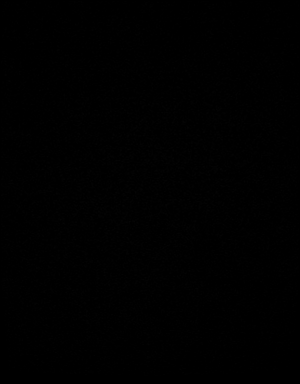

[Series 11: FLAIR · sagittal · 3.0mm · 0.43mm/px · 2 of 40 slices shown (2 of 2)]
[im 1/40]
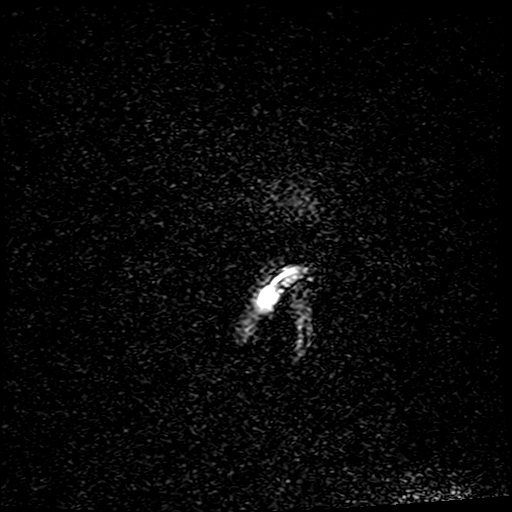
[im 40/40]
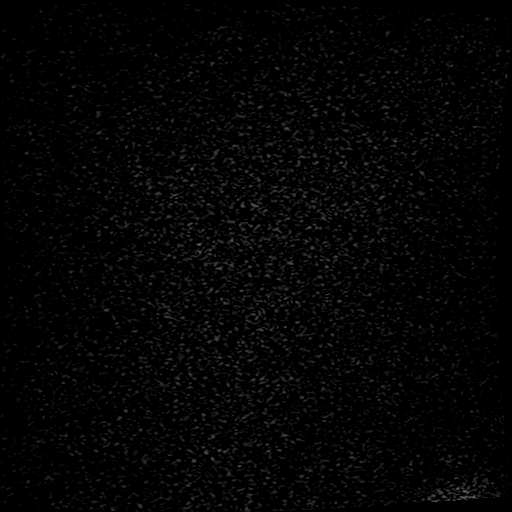

[Series 12: t1_mpr_tra · axial · 1.0mm · 0.72mm/px · z∈[-54,+105]mm · 9 of 160 slices shown]
[im 1/160]
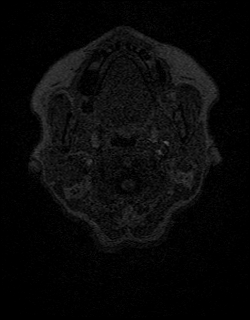
[im 20/160]
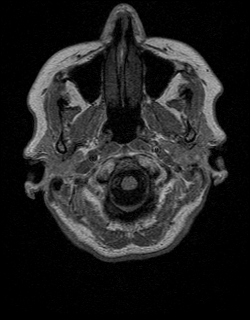
[im 40/160]
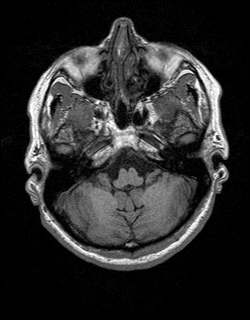
[im 60/160]
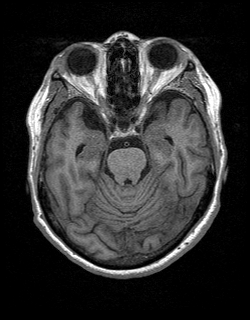
[im 80/160]
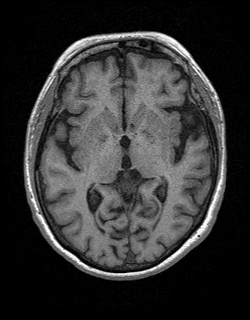
[im 100/160]
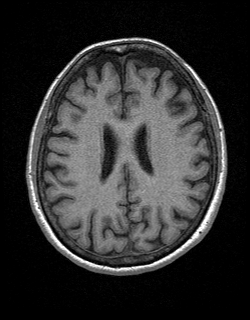
[im 120/160]
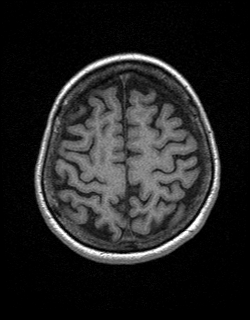
[im 140/160]
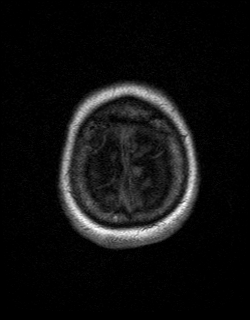
[im 160/160]
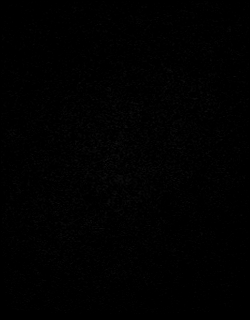

[Series 13: T2 · coronal · 5.0mm · 0.45mm/px · 2 of 29 slices shown (2 of 2)]
[im 1/29]
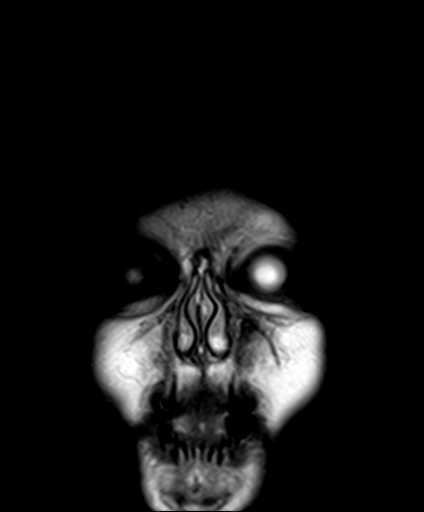
[im 29/29]
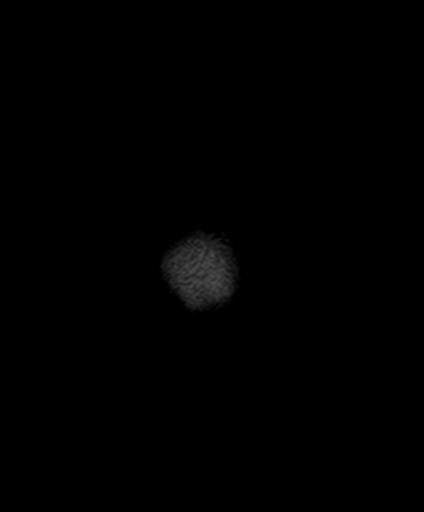

[Series 14: T1 post-contrast · coronal · 5.0mm · 0.72mm/px · 2 of 26 slices shown]
[im 1/26]
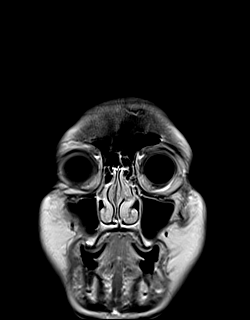
[im 26/26]
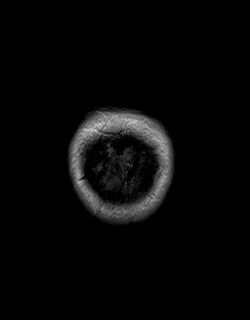

[Series 15: post t1_mpr_tra · axial · 1.0mm · 0.72mm/px · z∈[-54,+105]mm · 9 of 160 slices shown]
[im 1/160]
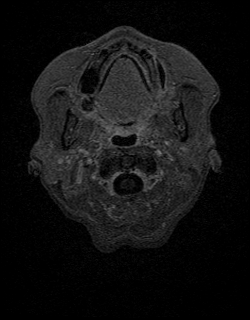
[im 20/160]
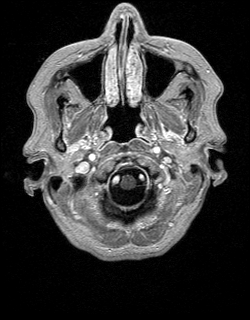
[im 40/160]
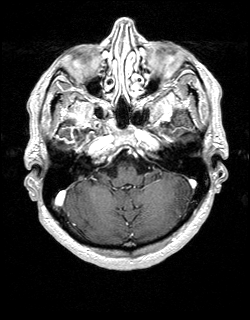
[im 60/160]
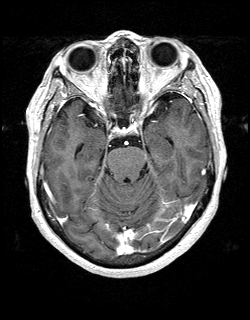
[im 80/160]
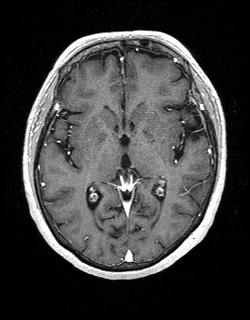
[im 100/160]
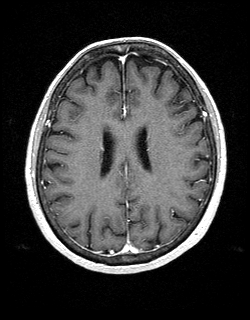
[im 120/160]
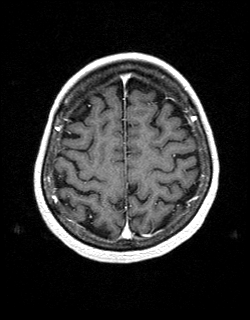
[im 140/160]
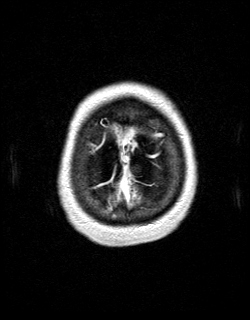
[im 160/160]
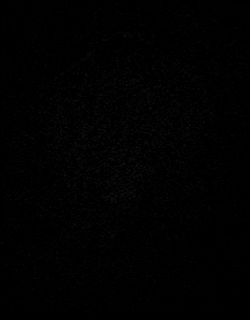

[48 of 48 positions shown; findings below may reference images not displayed]

FINDINGS: Brain: T2 hyperintensity in the anterior left frontal lobe is
stable. Linear T2 hyperintensity subcortical to the insula are also
stable. Mild periventricular changes bilaterally are otherwise
within normal limits for age.

No acute infarct, hemorrhage, or mass lesion is present. The
ventricles are of normal size. No significant extraaxial fluid
collection is present.

The internal auditory canals are within normal limits. The brainstem
and cerebellum are within normal limits.

Postcontrast images demonstrate no pathologic enhancement.

Vascular: Flow is present in the major intracranial arteries.

Skull and upper cervical spine: The craniocervical junction is
normal. Upper cervical spine is within normal limits. Marrow signal
is unremarkable.

Sinuses/Orbits: The paranasal sinuses and mastoid air cells are
clear. Bilateral lens replacements are noted. Globes and orbits are
otherwise unremarkable.
IMPRESSION: 1. No acute intracranial abnormality or significant interval change.
No discrete explanation for the patient's recent memory loss.
2. Stable T2 hyperintensity involving the anterior left frontal lobe
and subcortical white matter of the left insula. This is
nonspecific, but likely reflects the sequela of chronic
microvascular ischemia.

## 2021-07-04 MED ORDER — GADOBENATE DIMEGLUMINE 529 MG/ML IV SOLN
9.0000 mL | Freq: Once | INTRAVENOUS | Status: AC | PRN
  Administered 2021-07-04: 9 mL via INTRAVENOUS

## 2021-07-08 ENCOUNTER — Telehealth: Payer: Self-pay

## 2021-07-08 ENCOUNTER — Telehealth: Payer: Self-pay | Admitting: Endocrinology

## 2021-07-08 NOTE — Telephone Encounter (Signed)
Spoke with pt family informed them that MRI did not show any evidence of stroke, tumor, or bleed. It showed age-relatedchanges, no change from brain scan in 2017

## 2021-07-08 NOTE — Telephone Encounter (Signed)
-----   Message from Van Clines, MD sent at 07/08/2021 11:37 AM EDT ----- Pls let patient/daughter know brain MRI did not show any evidence of stroke, tumor, or bleed. It showed age-related changes, no change from brain scan in 2017. Thanks

## 2021-07-08 NOTE — Telephone Encounter (Signed)
Pt's daughter is calling in stating that, Dr.Ellison had put pt on a pill for DM.. sugars have been running in the 300's and pt would like to be back on insulin or either a different alternative. Pt's daughter would call back.

## 2021-07-09 NOTE — Telephone Encounter (Signed)
Please Advise

## 2021-07-09 NOTE — Telephone Encounter (Signed)
Message sent to MyChart.

## 2021-07-10 ENCOUNTER — Other Ambulatory Visit: Payer: Self-pay | Admitting: Endocrinology

## 2021-07-10 MED ORDER — REPAGLINIDE 2 MG PO TABS
2.0000 mg | ORAL_TABLET | Freq: Three times a day (TID) | ORAL | 3 refills | Status: DC
Start: 2021-07-10 — End: 2021-09-15

## 2021-07-10 NOTE — Telephone Encounter (Signed)
Patient's husband called to request the new RX with refills for repaglinide (PRANDIN) 2 MG tablet be sent to :   [facility]  [facility]) - [place], [place] - [address] Phone:  [phone]  Fax:  [phone]    Instead of [facility] due to the above medication is no cost when going through [facility]

## 2021-07-10 NOTE — Telephone Encounter (Signed)
Patients daughter called about patients blood sugar being in 400's - please return call to [phone]

## 2021-08-26 ENCOUNTER — Ambulatory Visit (INDEPENDENT_AMBULATORY_CARE_PROVIDER_SITE_OTHER): Payer: Medicare Other | Admitting: Endocrinology

## 2021-08-26 ENCOUNTER — Other Ambulatory Visit: Payer: Self-pay

## 2021-08-26 ENCOUNTER — Encounter: Payer: Self-pay | Admitting: Endocrinology

## 2021-08-26 VITALS — BP 118/50 | HR 82 | Ht 59.0 in | Wt 95.2 lb

## 2021-08-26 DIAGNOSIS — E119 Type 2 diabetes mellitus without complications: Secondary | ICD-10-CM | POA: Diagnosis not present

## 2021-08-26 DIAGNOSIS — Z794 Long term (current) use of insulin: Secondary | ICD-10-CM

## 2021-08-26 LAB — POCT GLYCOSYLATED HEMOGLOBIN (HGB A1C): Hemoglobin A1C: 7.8 % — AB (ref 4.0–5.6)

## 2021-08-26 MED ORDER — PIOGLITAZONE HCL 15 MG PO TABS: 15.0000 mg | ORAL_TABLET | Freq: Every day | ORAL | 3 refills | Status: DC

## 2021-08-26 NOTE — Progress Notes (Signed)
Subjective:    Patient ID: Haley Bartlett, female    DOB: 12-Jul-1951, 70 y.o.   MRN: [id number]  HPI Pt returns for f/u of diabetes mellitus: DM type: 2 Dx'ed: 1993 Complications: DR Therapy: 2 oral meds.   GDM: never DKA: never Severe hypoglycemia: never Pancreatitis: never Pancreatic imaging: normal on 2012 CT SDOH: Husband translates (and also provides hx, due to pt's memory loss); She could not afford rybelsus. Other: she took insulin [phone]; she eats meals at 8AM, 12N, and 7PM.   Interval history: pt states she feels well in general.  I reviewed continuous glucose monitor data.  Glucose varies from 130-340.  It is in general highest at 9AM, 2PM, and 7PM.   Past Medical History:  Diagnosis Date   Depression     Past Surgical History:  Procedure Laterality Date   ABDOMINAL HYSTERECTOMY     APPENDECTOMY     THYROIDECTOMY      Social History   Socioeconomic History   Marital status: Married    Spouse name: Oswaldo Done   Number of children: 2   Years of education: 12   Highest education level: Not on file  Occupational History   Occupation: retired  Tobacco Use   Smoking status: Never   Smokeless tobacco: Never  Vaping Use   Vaping Use: Never used  Substance and Sexual Activity   Alcohol use: No   Drug use: No   Sexual activity: Not on file  Other Topics Concern   Not on file  Social History Narrative   Lives with Oswaldo Done, husband   Caffeine use: Coffee daily   Right handed    Social Determinants of Health   Financial Resource Strain: Not on file  Food Insecurity: Not on file  Transportation Needs: Not on file  Physical Activity: Not on file  Stress: Not on file  Social Connections: Not on file  Intimate Partner Violence: Not on file    Current Outpatient Medications on File Prior to Visit  Medication Sig Dispense Refill   donepezil (ARICEPT) 10 MG tablet Take 1/2 tablet daily for 2 weeks, then increase to 1 tablet daily (please provide  instructions in Spanish) 30 tablet 11   dorzolamide-timolol (COSOPT) 22.3-6.8 MG/ML ophthalmic solution Place 1 drop into both eyes 2 (two) times daily.     gabapentin (NEURONTIN) 100 MG capsule Take 100 mg by mouth at bedtime. 1.5 at bed time     glucose blood (ACCU-CHEK GUIDE) test strip 1 each by Other route 2 (two) times daily. And lancets 2/day 200 each 3   latanoprost (XALATAN) 0.005 % ophthalmic solution      levothyroxine (SYNTHROID, LEVOTHROID) 25 MCG tablet Take 25 mcg by mouth daily before breakfast.     metFORMIN (GLUCOPHAGE-XR) 500 MG 24 hr tablet Take 4 tablets (2,000 mg total) by mouth daily. 360 tablet 3   NON FORMULARY Take by mouth daily. Verdis Frederickson helps with bladder     repaglinide (PRANDIN) 2 MG tablet Take 1 tablet (2 mg total) by mouth 3 (three) times daily before meals. 270 tablet 3   sertraline (ZOLOFT) 25 MG tablet Take 25 mg by mouth daily. 1.5 tab daily     No current facility-administered medications on file prior to visit.    No Known Allergies  Family History  Problem Relation Age of Onset   Dementia Sister    Diabetes Sister    Diabetes Mother    Diabetes Brother     BP (!) 118/50 (BP Location:  Right Arm, Patient Position: Sitting, Cuff Size: Normal)   Pulse 82   Ht 4\' 11"  (1.499 m)   Wt 95 lb 3.2 oz (43.2 kg)   SpO2 98%   BMI 19.23 kg/m    Review of Systems She denies hypoglycemia    Objective:   Physical Exam Pulses: dorsalis pedis intact bilat.   MSK: no deformity of the feet CV: no leg edema Skin:  no ulcer on the feet.  normal color and temp on the feet. Neuro: sensation is intact to touch on the feet  A1c=7.8%     Assessment & Plan:  Type 2 DM: uncontrolled  Patient Instructions  check your blood sugar twice a day.  vary the time of day when you check, between before the 3 meals, and at bedtime.  also check if you have symptoms of your blood sugar being too high or too low.  please keep a record of the readings and bring it to your  next appointment here (or you can bring the meter itself).  You can write it on any piece of paper.  please call sooner if your blood sugar goes below 70, or if most of your readings are over 200. I have sent a prescription to your pharmacy, to add pioglitazone. Please continue the same other 2 diabetes medications.   Please come back for a follow-up appointment in 4 months.

## 2021-08-26 NOTE — Patient Instructions (Addendum)
check your blood sugar twice a day.  vary the time of day when you check, between before the 3 meals, and at bedtime.  also check if you have symptoms of your blood sugar being too high or too low.  please keep a record of the readings and bring it to your next appointment here (or you can bring the meter itself).  You can write it on any piece of paper.  please call us sooner if your blood sugar goes below 70, or if most of your readings are over 200. I have sent a prescription to your pharmacy, to add pioglitazone. Please continue the same other 2 diabetes medications.   Please come back for a follow-up appointment in 4 months.

## 2021-09-10 ENCOUNTER — Encounter: Payer: Self-pay | Admitting: Endocrinology

## 2021-09-15 ENCOUNTER — Other Ambulatory Visit: Payer: Self-pay

## 2021-09-15 DIAGNOSIS — E119 Type 2 diabetes mellitus without complications: Secondary | ICD-10-CM

## 2021-09-15 MED ORDER — REPAGLINIDE 2 MG PO TABS: 2.0000 mg | ORAL_TABLET | Freq: Three times a day (TID) | ORAL | 3 refills | Status: DC

## 2021-09-19 ENCOUNTER — Telehealth: Payer: Self-pay | Admitting: Endocrinology

## 2021-09-19 NOTE — Telephone Encounter (Signed)
Pt spouse states that wife went back on insulin because sugars were going too high. Pt is on Dexcom6. Pt would like to stay on insulin and continue to receive supplies (through insurance). Pt has been taking insulin 3-4x a day depending on how high her sugars. Please call spouse for any info [phone]

## 2021-09-22 ENCOUNTER — Telehealth: Payer: Self-pay | Admitting: Endocrinology

## 2021-09-22 NOTE — Telephone Encounter (Signed)
Last OV notes faxed internally to [facility] @ [phone]

## 2021-09-22 NOTE — Telephone Encounter (Signed)
[facility] calling for notes of insulin use. Needs ameded notes to prove pt is back on insulin for continue use. Please fax over [phone]

## 2021-09-29 NOTE — Telephone Encounter (Signed)
Patient has been scheduled for appointment on 09/30/21 per Provider request

## 2021-09-30 ENCOUNTER — Other Ambulatory Visit: Payer: Self-pay

## 2021-09-30 ENCOUNTER — Ambulatory Visit (INDEPENDENT_AMBULATORY_CARE_PROVIDER_SITE_OTHER): Payer: Medicare Other | Admitting: Endocrinology

## 2021-09-30 ENCOUNTER — Encounter: Payer: Self-pay | Admitting: Endocrinology

## 2021-09-30 ENCOUNTER — Ambulatory Visit: Payer: Medicare Other | Admitting: Physician Assistant

## 2021-09-30 VITALS — BP 126/64 | HR 86 | Ht 59.0 in | Wt 99.4 lb

## 2021-09-30 DIAGNOSIS — E119 Type 2 diabetes mellitus without complications: Secondary | ICD-10-CM | POA: Diagnosis not present

## 2021-09-30 DIAGNOSIS — Z794 Long term (current) use of insulin: Secondary | ICD-10-CM

## 2021-09-30 LAB — POCT GLYCOSYLATED HEMOGLOBIN (HGB A1C): Hemoglobin A1C: 8.2 % — AB (ref 4.0–5.6)

## 2021-09-30 MED ORDER — SITAGLIPTIN PHOSPHATE 100 MG PO TABS
100.0000 mg | ORAL_TABLET | Freq: Every day | ORAL | 3 refills | Status: DC
Start: 2021-09-30 — End: 2021-10-07

## 2021-09-30 MED ORDER — PIOGLITAZONE HCL 30 MG PO TABS: 30.0000 mg | ORAL_TABLET | Freq: Every day | ORAL | 3 refills | Status: DC

## 2021-09-30 MED ORDER — INSULIN LISPRO (1 UNIT DIAL) 100 UNIT/ML (KWIKPEN): 3.0000 [IU] | PEN_INJECTOR | Freq: Three times a day (TID) | SUBCUTANEOUS | 3 refills | Status: DC

## 2021-09-30 NOTE — Progress Notes (Signed)
Subjective:    Patient ID: Haley Bartlett, female    DOB: 10/13/1951, 70 y.o.   MRN: [id number]  HPI Pt returns for f/u of diabetes mellitus:  DM type: 2 Dx'ed: 1993 Complications: DR Therapy: 3 oral meds.   GDM: never DKA: never Severe hypoglycemia: never Pancreatitis: never Pancreatic imaging: normal on 2012 CT SDOH: Husband translates (and also provides hx, due to pt's memory loss); She could not afford Rybelsus.   Other: she took insulin [phone]; she eats meals at 8AM, 12N, and 7PM.   Interval history: pt states she feels well in general.  I reviewed continuous glucose monitor data.  Glucose varies from 95-300.  It is in general highest at 10AM, and 3PM.  It decreases overnight.  It is in general lowest at 3-5AM.  She has resumed Humalog 3 units 3 times a day (just before each meal).  No recent steroids.  Past Medical History:  Diagnosis Date   Depression     Past Surgical History:  Procedure Laterality Date   ABDOMINAL HYSTERECTOMY     APPENDECTOMY     THYROIDECTOMY      Social History   Socioeconomic History   Marital status: Married    Spouse name: Oswaldo Done   Number of children: 2   Years of education: 12   Highest education level: Not on file  Occupational History   Occupation: retired  Tobacco Use   Smoking status: Never   Smokeless tobacco: Never  Vaping Use   Vaping Use: Never used  Substance and Sexual Activity   Alcohol use: No   Drug use: No   Sexual activity: Not on file  Other Topics Concern   Not on file  Social History Narrative   Lives with Oswaldo Done, husband   Caffeine use: Coffee daily   Right handed    Social Determinants of Health   Financial Resource Strain: Not on file  Food Insecurity: Not on file  Transportation Needs: Not on file  Physical Activity: Not on file  Stress: Not on file  Social Connections: Not on file  Intimate Partner Violence: Not on file    Current Outpatient Medications on File Prior to Visit   Medication Sig Dispense Refill   donepezil (ARICEPT) 10 MG tablet Take 1/2 tablet daily for 2 weeks, then increase to 1 tablet daily (please provide instructions in Spanish) 30 tablet 11   dorzolamide-timolol (COSOPT) 22.3-6.8 MG/ML ophthalmic solution Place 1 drop into both eyes 2 (two) times daily.     gabapentin (NEURONTIN) 100 MG capsule Take 100 mg by mouth at bedtime. 1.5 at bed time     glucose blood (ACCU-CHEK GUIDE) test strip 1 each by Other route 2 (two) times daily. And lancets 2/day 200 each 3   latanoprost (XALATAN) 0.005 % ophthalmic solution      levothyroxine (SYNTHROID, LEVOTHROID) 25 MCG tablet Take 25 mcg by mouth daily before breakfast.     metFORMIN (GLUCOPHAGE-XR) 500 MG 24 hr tablet Take 4 tablets (2,000 mg total) by mouth daily. 360 tablet 3   NON FORMULARY Take by mouth daily. Verdis Frederickson helps with bladder     repaglinide (PRANDIN) 2 MG tablet Take 1 tablet (2 mg total) by mouth 3 (three) times daily before meals. 270 tablet 3   sertraline (ZOLOFT) 25 MG tablet Take 25 mg by mouth daily. 1.5 tab daily     No current facility-administered medications on file prior to visit.    No Known Allergies  Family History  Problem  Relation Age of Onset   Dementia Sister    Diabetes Sister    Diabetes Mother    Diabetes Brother     BP 126/64 (BP Location: Right Arm, Patient Position: Sitting, Cuff Size: Normal)   Pulse 86   Ht 4\' 11"  (1.499 m)   Wt 99 lb 6.4 oz (45.1 kg)   SpO2 99%   BMI 20.08 kg/m   Review of Systems     Objective:   Physical Exam Pulses: dorsalis pedis intact bilat.   MSK: no deformity of the feet CV: no leg edema Skin:  no ulcer on the feet.  normal color and temp on the feet.  Neuro: sensation is intact to touch on the feet.    A1c=8.3%    Assessment & Plan:  Type 2 DM: uncontrolled.   Patient Instructions  check your blood sugar twice a day.  vary the time of day when you check, between before the 3 meals, and at bedtime.  also check  if you have symptoms of your blood sugar being too high or too low.  please keep a record of the readings and bring it to your next appointment here (or you can bring the meter itself).  You can write it on any piece of paper.  please call sooner if your blood sugar goes below 70, or if most of your readings are over 200. I have sent 3 prescriptions to your pharmacy; for the insulin, to add Januvia, and to increase the pioglitazone. Please continue the same other 2 diabetes medications.   Please come back for a follow-up appointment in 3 months.     controle su nivel de azcar en la [place] al da. Vare la hora del da en la que realiza la [place], entre antes de las 3 comidas y antes de [place]. tambin verifique si tiene sntomas de que su nivel de azcar en la sangre es demasiado alto o [place]. mantenga un registro de las lecturas y [place] a su prxima cita aqu (o puede traer Nurse, adult). Puedes escribirlo en cualquier hoja de papel. llmenos antes si su nivel de azcar en la sangre es inferior a 70 o si la mayora de sus lecturas superan los 200. He enviado 3 recetas a su farmacia; para la insulina, agregar Januvia, y aumentar la pioglitazona. [place] con los mismos otros 2 medicamentos para la diabetes. Vuelva para una cita de seguimiento en 3 meses.

## 2021-09-30 NOTE — Patient Instructions (Addendum)
check your blood sugar twice a day.  vary the time of day when you check, between before the 3 meals, and at bedtime.  also check if you have symptoms of your blood sugar being too high or too low.  please keep a record of the readings and bring it to your next appointment here (or you can bring the meter itself).  You can write it on any piece of paper.  please call us sooner if your blood sugar goes below 70, or if most of your readings are over 200. I have sent 3 prescriptions to your pharmacy; for the insulin, to add Januvia, and to increase the pioglitazone. Please continue the same other 2 diabetes medications.   Please come back for a follow-up appointment in 3 months.     controle su nivel de azcar en la [employer] al da. Vare la hora del da en la que realiza la [place], entre antes de las 3 comidas y antes de [place]. tambin verifique si tiene sntomas de que su nivel de azcar en la sangre es demasiado alto o [place]. mantenga un registro de las lecturas y Nurse, adult a su prxima cita aqu (o puede traer Chief Executive Officer). Puedes escribirlo en cualquier hoja de papel. llmenos antes si su nivel de azcar en la sangre es inferior a 70 o si la mayora de sus lecturas superan los 200. He enviado 3 recetas a su farmacia; para la insulina, agregar Januvia, y aumentar la pioglitazona. [place] con los mismos otros 2 medicamentos para la diabetes. Vuelva para una cita de seguimiento en 3 meses.

## 2021-10-07 ENCOUNTER — Other Ambulatory Visit: Payer: Self-pay

## 2021-10-07 ENCOUNTER — Telehealth: Payer: Self-pay | Admitting: Endocrinology

## 2021-10-07 DIAGNOSIS — E119 Type 2 diabetes mellitus without complications: Secondary | ICD-10-CM

## 2021-10-07 MED ORDER — INSULIN LISPRO (1 UNIT DIAL) 100 UNIT/ML (KWIKPEN): 3.0000 [IU] | PEN_INJECTOR | Freq: Three times a day (TID) | SUBCUTANEOUS | 3 refills | Status: DC

## 2021-10-07 MED ORDER — METFORMIN HCL ER 500 MG PO TB24: 2000.0000 mg | ORAL_TABLET | Freq: Every day | ORAL | 3 refills | Status: DC

## 2021-10-07 MED ORDER — PIOGLITAZONE HCL 30 MG PO TABS: 30.0000 mg | ORAL_TABLET | Freq: Every day | ORAL | 3 refills | Status: DC

## 2021-10-07 MED ORDER — SITAGLIPTIN PHOSPHATE 100 MG PO TABS: 100.0000 mg | ORAL_TABLET | Freq: Every day | ORAL | 3 refills | Status: DC

## 2021-10-07 NOTE — Telephone Encounter (Signed)
Patient's medications have now been sent to [facility]

## 2021-10-07 NOTE — Telephone Encounter (Signed)
MEDICATION: New RX for Metformin 1000 MG  AND  pioglitazone (ACTOS) 30 MG tablet AND  insulin lispro (HUMALOG KWIKPEN) 100 UNIT/ML KwikPen 15 mL 3 09/30/2021    AND sitaGLIPtin (JANUVIA) 100 MG tablet  (The 3 RX's listed above were sent to wrong PHARM on 09/30/21)  PHARMACY:   [facility]  [facility]) [place], [place] - [address] [place] Phone:  [phone]  Fax:  [phone]      HAS THE PATIENT CONTACTED THEIR PHARMACY?  Yes-[facility] did not receive RX's-new request for MG change for Metformin  IS THIS A 90 DAY SUPPLY : Yes  IS PATIENT OUT OF MEDICATION: No  IF NOT; HOW MUCH IS LEFT: ?  LAST APPOINTMENT DATE: @10 /25/2022  NEXT APPOINTMENT DATE:@2 /01/2022  DO WE HAVE YOUR PERMISSION TO LEAVE A DETAILED MESSAGE?: Yes  OTHER COMMENTS:    **Let patient know to contact pharmacy at the end of the day to make sure medication is ready. **  ** Please notify patient to allow 48-72 hours to process**  **Encourage patient to contact the pharmacy for refills or they can request refills through [facility]**

## 2021-11-21 ENCOUNTER — Ambulatory Visit: Payer: Medicare Other | Admitting: Endocrinology

## 2021-12-11 ENCOUNTER — Other Ambulatory Visit: Payer: Self-pay

## 2021-12-11 ENCOUNTER — Telehealth: Payer: Self-pay | Admitting: Neurology

## 2021-12-11 NOTE — Telephone Encounter (Signed)
Pt has refills until July on file at [facility]. Pt husband advised to call us back if he has trouble getting her medication ,

## 2021-12-11 NOTE — Telephone Encounter (Signed)
Patients husband said wife needs a refill on her aricept 10mg . She is not doing better

## 2022-01-08 ENCOUNTER — Ambulatory Visit (INDEPENDENT_AMBULATORY_CARE_PROVIDER_SITE_OTHER): Payer: Medicare Other | Admitting: Endocrinology

## 2022-01-08 ENCOUNTER — Other Ambulatory Visit: Payer: Self-pay

## 2022-01-08 ENCOUNTER — Encounter: Payer: Self-pay | Admitting: Endocrinology

## 2022-01-08 VITALS — BP 110/60 | HR 90 | Ht 59.0 in | Wt 99.2 lb

## 2022-01-08 DIAGNOSIS — E119 Type 2 diabetes mellitus without complications: Secondary | ICD-10-CM

## 2022-01-08 DIAGNOSIS — Z794 Long term (current) use of insulin: Secondary | ICD-10-CM | POA: Diagnosis not present

## 2022-01-08 LAB — POCT GLYCOSYLATED HEMOGLOBIN (HGB A1C): Hemoglobin A1C: 8.5 % — AB (ref 4.0–5.6)

## 2022-01-08 MED ORDER — INSULIN LISPRO (1 UNIT DIAL) 100 UNIT/ML (KWIKPEN): 11.0000 [IU] | PEN_INJECTOR | Freq: Three times a day (TID) | SUBCUTANEOUS | 3 refills | Status: DC

## 2022-01-08 NOTE — Patient Instructions (Addendum)
check your blood sugar twice a day.  vary the time of day when you check, between before the 3 meals, and at bedtime.  also check if you have symptoms of your blood sugar being too high or too low.  please keep a record of the readings and bring it to your next appointment here (or you can bring the meter itself).  You can write it on any piece of paper.  please call us sooner if your blood sugar goes below 70, or if most of your readings are over 200.  I have sent 3 prescriptions to your pharmacy; for the insulin, to increase the Humalog to 11 units 3 times a day (just before each meal).   Please continue the same other diabetes medications.   Please come back for a follow-up appointment in 2 months.

## 2022-01-08 NOTE — Progress Notes (Signed)
Subjective:    Patient ID: Haley Bartlett, female    DOB: 03/06/1951, 71 y.o.   MRN: [id number]  HPI Husband translates Pt returns for f/u of diabetes mellitus:  DM type: Insulin-requiring type 2 Dx'ed: 1993 Complications: DR Therapy: insulin since 2022, and 3 oral meds.   GDM: never DKA: never Severe hypoglycemia: never Pancreatitis: never Pancreatic imaging: normal on 2012 CT SDOH: Husband translates (and also provides hx, due to pt's memory loss); She could not afford Rybelsus or Januvia  Other: she took insulin [phone]; she eats meals at 8AM, 12N, and 7PM.   Interval history: pt states she feels well in general.  I reviewed continuous glucose monitor data.  Glucose varies from 140-340.  It is in general highest at 10PM, and less so at 10PM-12MN.  It decreases overnight.  It is in general lowest at 8AM.  She has increased Humalog to 8 units 3 times a day (just before each meal).  No recent steroids.   Past Medical History:  Diagnosis Date   Depression     Past Surgical History:  Procedure Laterality Date   ABDOMINAL HYSTERECTOMY     APPENDECTOMY     THYROIDECTOMY      Social History   Socioeconomic History   Marital status: Married    Spouse name: Oswaldo Done   Number of children: 2   Years of education: 12   Highest education level: Not on file  Occupational History   Occupation: retired  Tobacco Use   Smoking status: Never   Smokeless tobacco: Never  Vaping Use   Vaping Use: Never used  Substance and Sexual Activity   Alcohol use: No   Drug use: No   Sexual activity: Not on file  Other Topics Concern   Not on file  Social History Narrative   Lives with Oswaldo Done, husband   Caffeine use: Coffee daily   Right handed    Social Determinants of Health   Financial Resource Strain: Not on file  Food Insecurity: Not on file  Transportation Needs: Not on file  Physical Activity: Not on file  Stress: Not on file  Social Connections: Not on file  Intimate  Partner Violence: Not on file    Current Outpatient Medications on File Prior to Visit  Medication Sig Dispense Refill   donepezil (ARICEPT) 10 MG tablet Take 1/2 tablet daily for 2 weeks, then increase to 1 tablet daily (please provide instructions in Spanish) 30 tablet 11   dorzolamide-timolol (COSOPT) 22.3-6.8 MG/ML ophthalmic solution Place 1 drop into both eyes 2 (two) times daily.     gabapentin (NEURONTIN) 100 MG capsule Take 100 mg by mouth at bedtime. 1.5 at bed time     glucose blood (ACCU-CHEK GUIDE) test strip 1 each by Other route 2 (two) times daily. And lancets 2/day 200 each 3   latanoprost (XALATAN) 0.005 % ophthalmic solution      levothyroxine (SYNTHROID, LEVOTHROID) 25 MCG tablet Take 25 mcg by mouth daily before breakfast.     metFORMIN (GLUCOPHAGE-XR) 500 MG 24 hr tablet Take 4 tablets (2,000 mg total) by mouth daily. 360 tablet 3   NON FORMULARY Take by mouth daily. Verdis Frederickson helps with bladder     pioglitazone (ACTOS) 30 MG tablet Take 1 tablet (30 mg total) by mouth daily. 90 tablet 3   repaglinide (PRANDIN) 2 MG tablet Take 1 tablet (2 mg total) by mouth 3 (three) times daily before meals. 270 tablet 3   sertraline (ZOLOFT) 25 MG tablet  Take 25 mg by mouth daily. 1.5 tab daily     No current facility-administered medications on file prior to visit.    No Known Allergies  Family History  Problem Relation Age of Onset   Dementia Sister    Diabetes Sister    Diabetes Mother    Diabetes Brother     BP 110/60    Pulse 90    Ht 4\' 11"  (1.499 m)    Wt 99 lb 3.2 oz (45 kg)    SpO2 99%    BMI 20.04 kg/m    Review of Systems     Objective:   Physical Exam VITAL SIGNS:  See vs page GENERAL: no distress EXT: no leg edema.    Lab Results  Component Value Date   HGBA1C 8.5 (A) 01/08/2022      Assessment & Plan:  Insulin-requiring type 2 DM: uncontrolled.   Patient Instructions  check your blood sugar twice a day.  vary the time of day when you check,  between before the 3 meals, and at bedtime.  also check if you have symptoms of your blood sugar being too high or too low.  please keep a record of the readings and bring it to your next appointment here (or you can bring the meter itself).  You can write it on any piece of paper.  please call 03/08/2022 sooner if your blood sugar goes below 70, or if most of your readings are over 200.  I have sent 3 prescriptions to your pharmacy; for the insulin, to increase the Humalog to 11 units 3 times a day (just before each meal).   Please continue the same other diabetes medications.   Please come back for a follow-up appointment in 2 months.

## 2022-01-21 ENCOUNTER — Ambulatory Visit: Payer: Medicare Other | Admitting: Neurology

## 2022-01-21 ENCOUNTER — Other Ambulatory Visit: Payer: Self-pay

## 2022-01-21 ENCOUNTER — Encounter: Payer: Self-pay | Admitting: Neurology

## 2022-01-21 VITALS — BP 116/68 | HR 75 | Ht 59.0 in | Wt 102.6 lb

## 2022-01-21 DIAGNOSIS — F02A0 Dementia in other diseases classified elsewhere, mild, without behavioral disturbance, psychotic disturbance, mood disturbance, and anxiety: Secondary | ICD-10-CM

## 2022-01-21 DIAGNOSIS — G300 Alzheimer's disease with early onset: Secondary | ICD-10-CM | POA: Diagnosis not present

## 2022-01-21 MED ORDER — DONEPEZIL HCL 10 MG PO TABS: ORAL_TABLET | ORAL | 11 refills | Status: DC

## 2022-01-21 MED ORDER — MEMANTINE HCL 10 MG PO TABS: ORAL_TABLET | ORAL | 11 refills | Status: DC

## 2022-01-21 NOTE — Patient Instructions (Addendum)
Que bueno verte.   1. Comience con Memantina 10 mg: tome 1 tableta todas las noches durante 2 semanas, luego aumente a 1 tableta [employer] al da  2. Contine con donepezilo 10 mg al da  3. Seguimiento en 6 meses, llame para cualquier cambio   PRECAUCIONES DE CADA: Tenga cuidado al caminar. Escanee el rea en busca de obstculos que puedan aumentar el riesgo de tropiezos y cadas. Al levantarse por las maanas, sintese en el borde de la cama durante unos minutos antes de levantarse de la cama. Considere elevar la cama en la cabecera para evitar la cada de la presin arterial al levantarse. Camine siempre en una habitacin bien iluminada (utilice luces nocturnas en las paredes). Evite las alfombras o los cables elctricos de los electrodomsticos en el medio de los pasillos. Use un andador o un bastn si es necesario y considere la fisioterapia para el ejercicio del equilibrio. Hgase revisar la vista peridicamente.  SUPERVISIN FINANCIERA: Tambin se recomienda la supervisin, especialmente la supervisin al tomar decisiones o transacciones financieras.  SEGURIDAD EN EL HOGAR: Considere la seguridad de la cocina cuando opere electrodomsticos como estufas, hornos de microondas y licuadoras. Considere tener supervisin y Agricultural consultant las responsabilidades de cocinar hasta que ya no pueda participar en ellas. Los accidentes con armas de fuego y otros peligros en la casa tambin deben identificarse y abordarse.  CONDUCCIN: Respecto a la conduccin, en pacientes con problemas progresivos de memoria, la conduccin se ver perjudicada. Recomendamos que alguien ms conduzca si hay problemas para encontrar direcciones o si se informan accidentes menores. La evaluacin de conduccin independiente est disponible para determinar la seguridad de la conduccin.  HABILIDAD PARA QUEDAR SOLO: Si el paciente no puede comunicarse con el operador del 911, considere usar [facility], o cuando sea necesario, haga  arreglos para que alguien se quede con los [place]. Fumar es un peligro de incendio, considere la supervisin o Passenger transport manager. El cuidador debe evaluar el riesgo de deambular y, si se detecta en algn momento, se deben instituir recomendaciones de supervisin y pruebas seguras.  SUPERVISIN DE MEDICAMENTOS: La incapacidad para autoadministrarse medicamentos debe abordarse constantemente. Implementar un mecanismo para garantizar la administracin segura de los medicamentos.  RECOMENDACIONES PARA TODOS LOS PACIENTES CON PROBLEMAS DE MEMORIA: 1. Contine haciendo ejercicio (se recomiendan 30 minutos de caminata todos los [address]  [address] o 3 horas cada semana) 2. Aumente las interacciones sociales: contine yendo a la [place] y disfrute de las reuniones sociales con amigos y familiares. 3. Come sano, evita las frituras y come ms frutas y verduras 4. Mantener niveles adecuados de presin arterial, azcar en la sangre y colesterol en la sangre. Reducir el riesgo de accidente cerebrovascular y enfermedad cardiovascular tambin ayuda a promover Art gallery manager. 5. Evite situaciones estresantes. Vive una vida sencilla y evita las [place]. Organice su tiempo y preprese para el da siguiente con anticipacin. 6. Duerma bien, evite cualquier interrupcin del sueo y evite cualquier distraccin en el dormitorio que pueda interferir con la calidad [place] del sueo. 7. Evite el azcar, evite los dulces, ya que existe un fuerte vnculo entre el consumo excesivo de International aid/development worker, la diabetes y Nurse, learning disability cognitivo Se ha demostrado que la dieta mediterrnea ayuda a los pacientes a reducir el riesgo de trastornos progresivos de la memoria y reduce el riesgo cardiovascular. Esto incluye comer pescado, comer frutas y verduras de [employer], nueces como almendras y [place], nueces y [place] usar aceite de [employer]. Evite las comidas rpidas y los alimentos fritos tanto como  sea posible. Evite los dulces y Insurance claims handler, ya que el uso de  azcar se ha relacionado con el empeoramiento de la funcin de la [place].  Siempre existe la preocupacin de la progresin gradual de los problemas de [place]. Si este es [address], es posible que debamos ajustar el nivel de atencin de acuerdo con las necesidades del [place]. A continuacin, se debe poner en marcha el apoyo, tanto para el paciente como para Public librarian.    Good to see you.   Start Memantine 10mg : take 1 tablet every night for 2 weeks, then increase to 1 tablet twice a day  2. Continue Donepezil 10mg  daily  3. Follow-up in 6 months, call for any changes   FALL PRECAUTIONS: Be cautious when walking. Scan the area for obstacles that may increase the risk of trips and falls. When getting up in the mornings, sit up at the edge of the bed for a few minutes before getting out of bed. Consider elevating the bed at the head end to avoid drop of blood pressure when getting up. Walk always in a well-lit room (use night lights in the walls). Avoid area rugs or power cords from appliances in the middle of the walkways. Use a walker or a cane if necessary and consider physical therapy for balance exercise. Get your eyesight checked regularly.  FINANCIAL OVERSIGHT: Supervision, especially oversight when making financial decisions or transactions is also recommended.  HOME SAFETY: Consider the safety of the kitchen when operating appliances like stoves, microwave oven, and blender. Consider having supervision and share cooking responsibilities until no longer able to participate in those. Accidents with firearms and other hazards in the house should be identified and addressed as well.  DRIVING: Regarding driving, in patients with progressive memory problems, driving will be impaired. We advise to have someone else do the driving if trouble finding directions or if minor accidents are reported. Independent driving assessment is available to determine safety of driving.  ABILITY TO BE LEFT  ALONE: If patient is unable to contact 911 operator, consider using Life[facility], or when the need is there, arrange for someone to stay with patients. Smoking is a fire hazard, consider supervision or cessation. Risk of wandering should be assessed by caregiver and if detected at any point, supervision and safe proof recommendations should be instituted.  MEDICATION SUPERVISION: Inability to self-administer medication needs to be constantly addressed. Implement a mechanism to ensure safe administration of the medications.  RECOMMENDATIONS FOR ALL PATIENTS WITH MEMORY PROBLEMS: 1. Continue to exercise (Recommend 30 minutes of walking everyday, or 3 hours every week) 2. Increase social interactions - continue going to [place] and enjoy social gatherings with friends and family 3. Eat healthy, avoid fried foods and eat more fruits and vegetables 4. Maintain adequate blood pressure, blood sugar, and blood cholesterol level. Reducing the risk of stroke and cardiovascular disease also helps promoting better memory. 5. Avoid stressful situations. Live a simple life and avoid aggravations. Organize your time and prepare for the next day in anticipation. 6. Sleep well, avoid any interruptions of sleep and avoid any distractions in the bedroom that may interfere with adequate sleep quality 7. Avoid sugar, avoid sweets as there is a strong link between excessive sugar intake, diabetes, and cognitive impairment The Mediterranean diet has been shown to help patients reduce the risk of progressive memory disorders and reduces cardiovascular risk. This includes eating fish, eat fruits and green leafy vegetables, nuts like almonds and hazelnuts, walnuts, and also use  olive oil. Avoid fast foods and fried foods as much as possible. Avoid sweets and sugar as sugar use has been linked to worsening of memory function.  There is always a concern of gradual progression of memory problems. If this is the case, then we may need  to adjust level of care according to patient needs. Support, both to the patient and caregiver, should then be put into place.

## 2022-01-21 NOTE — Progress Notes (Signed)
NEUROLOGY FOLLOW UP OFFICE NOTE  Haley Bartlett [id number] [birth year]  HISTORY OF PRESENT ILLNESS: I had the pleasure of seeing Haley Bartlett in follow-up in the neurology clinic on 01/21/2022.  The patient was last seen 7 months ago for dementia. She is accompanied by her husband who helps supplement the history today. A Spanish medical interpreter Vernona Rieger was present to help with translation. Records and images were personally reviewed where available. MOCA score 10/30 in 06/2021. She was started on Donepezil 10mg  daily which she is tolerating without side effects. 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 B12 level normal. Since her last visit, she thinks she is losing her memory more. She asks me to tell her husband to be more patient with her. He reports she is doing things differently now, she is always looking for things, looking for her phone twice a day, always losing her glucose sensor. She would blame him for moving them somewhere else. She manages her own medications, he is not sure about compliance but states she does fine, she does not want him to help her. He manages finances. She does not drive. She continues to cook daily, he states she has not left the stove on, but she admits to doing this recently. No hallucinations. She is independent with dressing and bathing. Sleep is good, there was one time yesterday when she was talking loud and did not remember her dream initially, she states she remembered it 6 hours later. She denies any other vivid dreams otherwise. He states she does not like to eat anymore, appetite is way down, she refuses to eat without trying the food. When she eats meat, she gets diarrhea. She continues to report frequent headaches in the back of her head and frontal region, "like shocks" in  her head. She has pain at the top of her neck. She has tingling in both legs and takes gabapentin 100mg  qhs for neuropathy. She had a fall last Wednesday, injuring her left knee and right ankle. She is not sure what happened, everything got blurry and she went down. Her husband reports she was hypoglycemic.    History on Initial Assessment 06/30/2021: This is a 71 year old right-handed woman with a history of diabetes, neuropathy, B12 deficiency, anxiety, presenting for evaluation of memory loss. She was previously evaluated by neurologist Dr. 07/02/2021 in 2017 for similar concerns. She has a family history of Alzheimer's disease in her sister who presented at age 36 and died at age 66. At that time, her husband reported noticing memory changes starting around 2014, she was getting lost driving, forgetting names of coworkers she had worked with for many years. She was repeating herself, losing things, forgetting things on the stove or ingredients while cooking. At that time, she endorsed depression, crying most days. She had a brain MRI without contrast in 11/2016 which I personally reviewed, no acute changes, brain volume normal for age, mild chronic microvascular disease.  She states her memory is "bad." She lives with her husband and 2015. 12/2016 reports the memory changes in 2017 were "a little, not too bad," but have progressively gotten worse over the years. This morning she lost her coffee cup and could not recall if she drank coffee or where she put her cup. She is very forgetful. She has left the stove on.  She drives very minimally, her family drives her places. She has always been bad with directions, but even when she crosses the street she does not look around, worse recently. She manages her own medications, [name] checks behind and states she does pretty good with this. Her husband manages finances. She misplaces things frequently. [name] notes sometimes she seems lost, there is a delay when  answering questions like she has a hard time processing information. She had a hearing evaluation and it was not so bad. She used to sew really well, sewing wedding dresses, and now is unable to. She babysits her 4yo and 9yo grandchildren. She is also moody and argues with her husband, believing things a certain way and very adamant about it even if she is wrong. She understands conversations to be different. No hallucinations. She states she has not been feeling well lately because of headaches. Headaches started a year ago, with pain in the back of her head occurring once a week. There is no nausea/vomiting, she does not take prn medication. She denies any diplopia, dysarthria/dysphagia, bowel dysfunction. She has neuropathy in her hands and feet and takes gabapentin 100mg  qhs. She has some urinary leakage when she does her walking. Sense of taste and smell are not good. She has occasional hand tremors when eating and writing. She does not sleep well, usually with 4-5 hours of sleep. She does not take naps. She had a fall in April and May with a lot of leg cramps. She reports her glucose levels go up and down, especially at night. Her paternal grandmother had Alzheimer's disease, as well as her older sister as noted above. No history of significant head injuries or alcohol use.      Lab Results  Component Value Date   HGBA1C 8.5 (A) 01/08/2022    PAST MEDICAL HISTORY: Past Medical History:  Diagnosis Date   Depression     MEDICATIONS: Current Outpatient Medications on File Prior to Visit  Medication Sig Dispense Refill   donepezil (ARICEPT) 10 MG tablet Take 1/2 tablet daily for 2 weeks, then increase to 1 tablet daily (please provide instructions in Spanish) 30 tablet 11   dorzolamide-timolol (COSOPT) 22.3-6.8 MG/ML ophthalmic solution Place 1 drop into both eyes 2 (two) times daily.     gabapentin (NEURONTIN) 100 MG capsule Take 100 mg by mouth at bedtime. 1.5 at bed time     glucose blood  (ACCU-CHEK GUIDE) test strip 1 each by Other route 2 (two) times daily. And lancets 2/day 200 each 3   insulin lispro (HUMALOG KWIKPEN) 100 UNIT/ML KwikPen Inject 11 Units into the skin 3 (three) times daily with meals. And pen needles 1/day 30 mL 3   latanoprost (XALATAN) 0.005 % ophthalmic solution      levothyroxine (SYNTHROID, LEVOTHROID) 25 MCG tablet Take 25 mcg by mouth daily before breakfast.     metFORMIN (GLUCOPHAGE-XR) 500 MG 24 hr tablet Take 4 tablets (2,000 mg total) by mouth daily. 360 tablet 3   NON FORMULARY Take by mouth daily. 03/08/2022 helps with bladder     pioglitazone (ACTOS) 30 MG tablet Take 1 tablet (30 mg total) by mouth daily. 90 tablet 3   repaglinide (PRANDIN) 2 MG tablet Take 1 tablet (2 mg total) by mouth 3 (three) times daily before meals. 270 tablet 3   sertraline (ZOLOFT) 25 MG tablet Take 25 mg by mouth daily. 1.5 tab daily     No current facility-administered medications on file prior to visit.  ALLERGIES: No Known Allergies  FAMILY HISTORY: Family History  Problem Relation Age of Onset   Dementia Sister    Diabetes Sister    Diabetes Mother    Diabetes Brother     SOCIAL HISTORY: Social History   Socioeconomic History   Marital status: Married    Spouse name: Oswaldo Done   Number of children: 2   Years of education: 12   Highest education level: Not on file  Occupational History   Occupation: retired  Tobacco Use   Smoking status: Never   Smokeless tobacco: Never  Vaping Use   Vaping Use: Never used  Substance and Sexual Activity   Alcohol use: No   Drug use: No   Sexual activity: Not on file  Other Topics Concern   Not on file  Social History Narrative   Lives with Oswaldo Done, husband   Caffeine use: Coffee daily   Right handed    Social Determinants of Health   Financial Resource Strain: Not on file  Food Insecurity: Not on file  Transportation Needs: Not on file  Physical Activity: Not on file  Stress: Not on file  Social  Connections: Not on file  Intimate Partner Violence: Not on file     PHYSICAL EXAM: Vitals:   01/21/22 1117  BP: 116/68  Pulse: 75  SpO2: 99%   General: No acute distress Head:  Normocephalic/atraumatic Skin/Extremities: No rash, no edema Neurological Exam: alert and awake. No aphasia or dysarthria. Fund of knowledge is appropriate.  Attention and concentration are normal.   Cranial nerves: Pupils equal, round. Extraocular movements intact with no nystagmus. Visual fields full.  No facial asymmetry.  Motor: Bulk and tone normal, muscle strength 5/5 throughout with no pronator drift.   Finger to nose testing intact.  Gait narrow-based and steady, able to tandem walk adequately.  Romberg negative.   IMPRESSION: This is a 71 yo RH woman with a history of diabetes, neuropathy, B12 deficiency, anxiety, with early onset dementia concerning for Alzheimer's disease. Symptoms started at age 71. There is a family history of early onset dementia in her sister. MOCA 10/30 in 06/2021. They report continued worsening of memory, we discussed adding on Memantine to Donepezil. Side effects and expectations from medications were discussed. Memantine may help with headaches as well. Start Memantine 10mg  qhs x 2 weeks, then increase to 10mg  BID. Continue Donepezil 10mg  daily. If no improvement in headaches on next visit, can consider increasing gabapentin for headache prophylaxis. Advised to increase medication supervision. She does not drive. Continue regular exercise, control of vascular risk factors for overall brain health. Follow-up with Memory Disorder PA Marlowe Kays in 6 months, call for any changes.   Thank you for allowing me to participate in her care.  Please do not hesitate to call for any questions or concerns.    Patrcia Dolly, M.D.   CC: Jarrett Soho, PA-C

## 2022-02-12 ENCOUNTER — Other Ambulatory Visit: Payer: Self-pay

## 2022-02-12 DIAGNOSIS — Z794 Long term (current) use of insulin: Secondary | ICD-10-CM

## 2022-02-12 LAB — HM DIABETES EYE EXAM

## 2022-02-12 MED ORDER — METFORMIN HCL ER 500 MG PO TB24: 2000.0000 mg | ORAL_TABLET | Freq: Every day | ORAL | 3 refills | Status: DC

## 2022-02-18 ENCOUNTER — Encounter: Payer: Self-pay | Admitting: Endocrinology

## 2022-02-23 ENCOUNTER — Ambulatory Visit: Payer: Medicare Other | Admitting: Endocrinology

## 2022-02-23 ENCOUNTER — Other Ambulatory Visit: Payer: Self-pay

## 2022-02-23 VITALS — BP 126/72 | HR 69 | Ht 59.0 in | Wt 98.0 lb

## 2022-02-23 DIAGNOSIS — E119 Type 2 diabetes mellitus without complications: Secondary | ICD-10-CM | POA: Diagnosis not present

## 2022-02-23 DIAGNOSIS — Z794 Long term (current) use of insulin: Secondary | ICD-10-CM

## 2022-02-23 MED ORDER — DAPAGLIFLOZIN PROPANEDIOL 10 MG PO TABS: 10.0000 mg | ORAL_TABLET | Freq: Every day | ORAL | 3 refills | Status: DC

## 2022-02-23 MED ORDER — REPAGLINIDE 1 MG PO TABS: 1.0000 mg | ORAL_TABLET | Freq: Three times a day (TID) | ORAL | 3 refills | Status: DC

## 2022-02-23 NOTE — Progress Notes (Signed)
? ?Subjective:  ? ? Patient ID: Haley Bartlett, female    DOB: 19-Jan-1951, 71 y.o.   MRN: [id number] ? ?HPI ?dtr translates ?Pt returns for f/u of diabetes mellitus:  ?DM type: Insulin-requiring type 2 ?Dx'ed: 1993 ?Complications: DR ?Therapy: insulin since 2022, and 3 oral meds.   ?GDM: never ?DKA: never ?Severe hypoglycemia: never ?Pancreatitis: never ?Pancreatic imaging: normal on 2012 CT ?SDOH: Husband translates (and also provides hx, due to pt's memory loss); She could not afford Rybelsus or Januvia.   ?Other: she took insulin [id number]; she eats meals at 8AM, 12N, and 7PM; she takes MDI.   ?Interval history: pt states she feels well in general.  I reviewed continuous glucose monitor data.  Glucose varies from 140-340.  It is in general highest at 10PM, and less so at 10PM-12MN.  It decreases overnight.  I reviewed continuous glucose monitor data.  Glucose varies from 130-390.  It is in general highest at 11AM, 4PM, and .  It is lowest at 6AM.  It decreases overnight.  No recent steroids.  She has hypoglycemia approx BIW.  This usually happens in the afternoon.  She has stopped taking the metformin.  Dtr requests Marcelline Deist, and to d/c MDI.  ?Past Medical History:  ?Diagnosis Date  ? Depression   ? ? ?Past Surgical History:  ?Procedure Laterality Date  ? ABDOMINAL HYSTERECTOMY    ? APPENDECTOMY    ? THYROIDECTOMY    ? ? ?Social History  ? ?Socioeconomic History  ? Marital status: Married  ?  Spouse name: Oswaldo Done  ? Number of children: 2  ? Years of education: 45  ? Highest education level: Not on file  ?Occupational History  ? Occupation: retired  ?Tobacco Use  ? Smoking status: Never  ? Smokeless tobacco: Never  ?Vaping Use  ? Vaping Use: Never used  ?Substance and Sexual Activity  ? Alcohol use: No  ? Drug use: No  ? Sexual activity: Not on file  ?Other Topics Concern  ? Not on file  ?Social History Narrative  ? Lives with Oswaldo Done, husband  ? Caffeine use: Coffee daily  ? Right handed   ? ?Social  Determinants of Health  ? ?Financial Resource Strain: Not on file  ?Food Insecurity: Not on file  ?Transportation Needs: Not on file  ?Physical Activity: Not on file  ?Stress: Not on file  ?Social Connections: Not on file  ?Intimate Partner Violence: Not on file  ? ? ?Current Outpatient Medications on File Prior to Visit  ?Medication Sig Dispense Refill  ? donepezil (ARICEPT) 10 MG tablet Take 1 tablet daily 30 tablet 11  ? dorzolamide-timolol (COSOPT) 22.3-6.8 MG/ML ophthalmic solution Place 1 drop into both eyes 2 (two) times daily.    ? gabapentin (NEURONTIN) 100 MG capsule Take 100 mg by mouth at bedtime. Taking 1 qhs    ? glucose blood (ACCU-CHEK GUIDE) test strip 1 each by Other route 2 (two) times daily. And lancets 2/day 200 each 3  ? insulin lispro (HUMALOG KWIKPEN) 100 UNIT/ML KwikPen Inject 11 Units into the skin 3 (three) times daily with meals. And pen needles 1/day 30 mL 3  ? latanoprost (XALATAN) 0.005 % ophthalmic solution     ? levothyroxine (SYNTHROID, LEVOTHROID) 25 MCG tablet Take 25 mcg by mouth daily before breakfast.    ? memantine (NAMENDA) 10 MG tablet Take 1 tablet every night for 2 weeks, then increase to 1 tablet twice a day 60 tablet 11  ? pioglitazone (ACTOS) 30 MG  tablet Take 1 tablet (30 mg total) by mouth daily. 90 tablet 3  ? sertraline (ZOLOFT) 25 MG tablet Take 25 mg by mouth daily. 1.5 tab daily    ? ?No current facility-administered medications on file prior to visit.  ? ? ?No Known Allergies ? ?Family History  ?Problem Relation Age of Onset  ? Dementia Sister   ? Diabetes Sister   ? Diabetes Mother   ? Diabetes Brother   ? ? ?BP 126/72 (BP Location: Left Arm, Patient Position: Sitting, Cuff Size: Normal)   Pulse 69   Ht 4\' 11"  (1.499 m)   Wt 98 lb (44.5 kg)   SpO2 99%   BMI 19.79 kg/m?  ? ? ?Review of Systems ? ?   ?Objective:  ? Physical Exam ? ? ?Lab Results  ?Component Value Date  ? CREATININE 0.86 10/23/2016  ? BUN 13 10/23/2016  ? NA 134 (L) 10/23/2016  ? K 4.3  10/23/2016  ? CL 101 10/23/2016  ? CO2 22 10/23/2016  ? ?Lab Results  ?Component Value Date  ? HGBA1C 8.5 (A) 01/08/2022  ? ?   ?Assessment & Plan:  ?Insulin-requiring type 2 DM: uncontrolled.  I told dtr we should only make a few med changes at a time.   ? ?Patient Instructions  ?check your blood sugar twice a day.  vary the time of day when you check, between before the 3 meals, and at bedtime.  also check if you have symptoms of your blood sugar being too high or too low.  please keep a record of the readings and bring it to your next appointment here (or you can bring the meter itself).  You can write it on any piece of paper.  please call 03/08/2022 sooner if your blood sugar goes below 70, or if most of your readings are over 200.   ?We will need to take this complex situation in stages.   ?I have sent 2 prescriptions to your pharmacy; to reduce the repaglinide, and to add [place].   ?Please continue the same other diabetes medications.   ?Please come back for a follow-up appointment in 3 months.    ? ? ?controle su nivel de az?car en la [place] veces al d?a. Var?e la hora del d?a en la que realiza la [place], entre antes de las 3 comidas y antes de [place]. tambi?n verifique si tiene s?ntomas de que su nivel de az?car en la sangre es demasiado alto o [place]. mantenga un registro de las lecturas y tr?igalo a su pr?xima cita aqu? (o puede traer [place]). Puedes escribirlo en cualquier hoja de papel. ll?menos antes si su nivel de az?car en la sangre es inferior a 70 o si la mayor?a de sus lecturas superan los 200. ?Tendremos que tomar esta compleja situaci?n por etapas. ?He enviado 2 recetas a su farmacia; reducir la repaglinida y a?adir [employer]. ?Contin?e con los mismos otros medicamentos para la diabetes. ?Vuelva para una cita de seguimiento en 3 meses. ? ? ? ? ?

## 2022-02-23 NOTE — Patient Instructions (Addendum)
check your blood sugar twice a day.  vary the time of day when you check, between before the 3 meals, and at bedtime.  also check if you have symptoms of your blood sugar being too high or too low.  please keep a record of the readings and bring it to your next appointment here (or you can bring the meter itself).  You can write it on any piece of paper.  please call us sooner if your blood sugar goes below 70, or if most of your readings are over 200.   ?We will need to take this complex situation in stages.   ?I have sent 2 prescriptions to your pharmacy; to reduce the repaglinide, and to add [place].   ?Please continue the same other diabetes medications.   ?Please come back for a follow-up appointment in 3 months.    ? ? ?controle su nivel de az?car en la [employer] veces al d?a. Var?e la hora del d?a en la que realiza la [place], entre antes de las 3 comidas y antes de [place]. tambi?n verifique si tiene s?ntomas de que su nivel de az?car en la sangre es demasiado alto o [place]. mantenga un registro de las lecturas y tr?igalo a su pr?xima cita aqu? (o puede traer [employer]). Puedes escribirlo en cualquier hoja de papel. ll?menos antes si su nivel de az?car en la sangre es inferior a 70 o si la mayor?a de sus lecturas superan los 200. ?Tendremos que tomar esta compleja situaci?n por etap[place]. ?He enviado 2 recetas a su farmacia; reducir la repaglinida y a?adir [place]. ?Contin?e con los mismos otros medicamentos para la diabetes. ?Vuelva para una cita de seguimiento en 3 meses. ? ? ?

## 2022-03-07 DIAGNOSIS — E1165 Type 2 diabetes mellitus with hyperglycemia: Secondary | ICD-10-CM | POA: Diagnosis not present

## 2022-03-11 DIAGNOSIS — S4991XA Unspecified injury of right shoulder and upper arm, initial encounter: Secondary | ICD-10-CM | POA: Diagnosis not present

## 2022-03-11 DIAGNOSIS — M25511 Pain in right shoulder: Secondary | ICD-10-CM | POA: Diagnosis not present

## 2022-03-11 DIAGNOSIS — W010XXA Fall on same level from slipping, tripping and stumbling without subsequent striking against object, initial encounter: Secondary | ICD-10-CM | POA: Diagnosis not present

## 2022-03-16 ENCOUNTER — Other Ambulatory Visit: Payer: Self-pay

## 2022-03-16 MED ORDER — MEMANTINE HCL 10 MG PO TABS: ORAL_TABLET | ORAL | 11 refills | Status: DC

## 2022-03-16 MED ORDER — DONEPEZIL HCL 10 MG PO TABS: ORAL_TABLET | ORAL | 11 refills | Status: DC

## 2022-03-17 DIAGNOSIS — Z1321 Encounter for screening for nutritional disorder: Secondary | ICD-10-CM | POA: Diagnosis not present

## 2022-03-17 DIAGNOSIS — R5383 Other fatigue: Secondary | ICD-10-CM | POA: Diagnosis not present

## 2022-03-17 DIAGNOSIS — R42 Dizziness and giddiness: Secondary | ICD-10-CM | POA: Diagnosis not present

## 2022-03-26 ENCOUNTER — Encounter: Payer: Self-pay | Admitting: Physician Assistant

## 2022-03-26 ENCOUNTER — Ambulatory Visit (INDEPENDENT_AMBULATORY_CARE_PROVIDER_SITE_OTHER): Payer: Medicare Other | Admitting: Physician Assistant

## 2022-03-26 VITALS — BP 106/65 | HR 78 | Resp 18 | Ht 59.0 in | Wt 94.0 lb

## 2022-03-26 DIAGNOSIS — F02A0 Dementia in other diseases classified elsewhere, mild, without behavioral disturbance, psychotic disturbance, mood disturbance, and anxiety: Secondary | ICD-10-CM | POA: Diagnosis not present

## 2022-03-26 DIAGNOSIS — G300 Alzheimer's disease with early onset: Secondary | ICD-10-CM

## 2022-03-26 DIAGNOSIS — R42 Dizziness and giddiness: Secondary | ICD-10-CM

## 2022-03-26 NOTE — Progress Notes (Signed)
? ?NEUROLOGY FOLLOW UP OFFICE NOTE ? ?Haley Bartlett ?[id number] ?[birth year] ? ? ? ?IMPRESSION: ? 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 Symptoms started at age 50. There is a family history of early onset dementia in her sister. MOCA 10/30 in 06/2021. They report continued worsening of memory, we discussed adding on Memantine to Donepezil. Side effects and expectations from medications were discussed. She is on memantine 10mg  BID. Continue Donepezil 10mg  daily. Advised to increase medication supervision. She does not drive. ? ?Recurrent Vertigo ?Unclear etiology.  Initial episode was 4 years ago, the next about 2 years ago, treated with steroids (no records), and the last episode about 2 weeks ago.  She denies any dizziness or nausea.  No recent infections.  She denies any allergies.  She denies any cardiac or stroke complaints.  She had a couple of episodes of fall without head injury or loss of consciousness.  She has a longstanding history of depression.  Last MRI of the brain negative for stroke, but with microvascular chronic disease.  Suspect benign positional vertigo ?Start prednisone pack (21 tabs) as directed, needs to closely monitor blood sugars while on the medication. ?Referral to vestibular therapy ?Repeat MRI of the brain to rule out any structural or vascular abnormalities. ?She has been instructed to be seen at the ED if symptoms are severe. ?Follow-up in 1 month or earlier if any other abnormalities are noted. ? ?HISTORY OF PRESENT ILLNESS: ? ?I had the pleasure of seeing Haley Bartlett in follow-up in the neurology clinic on 03/26/2022.  Patient was last seen on 01/21/2022 for memory loss.  She is accompanied by her husband who helps supplement the history today.  The patient is a 03/28/2022, and this 01/23/2022 is the interpreter.   Records and images were personally reviewed where available.  MoCA on 06/25/2021 was 10/30.  The patient is of donepezil 10 mg daily, tolerating well.  MRI with and without contrast in July 2022 was without acute changes, stable chronic microvascular disease in the anterior left frontal lobe and subcortical white matter of the left insula compared to 2017 imaging.  B12 was normal.  Regarding her memory, it is about the same since her last visit.  The nature of this visit is regarding vertigo.  Apparently, this is recovering, initially presented in 2017, required in steroids with resolution.  There are no records about these events.  She reports that about 2 years ago, the symptoms returned again, and did not require any treatment.  She states that for the last 2 weeks, she has been experiencing similar symptoms.  These include feeling constant sensation of room moving around her, without lightheadedness, nausea, or vomiting.  This is constant, she prefers not to move her head to make it worse.  When she lies down, the symptoms are exacerbated.  She has not tried anything for the symptoms at this time, denies any neck pain, recent URI, or flulike symptoms.  She denies any history of stroke or heart problems.  She is on gabapentin in for headache.  Getting worse. Worse with certain head movements. Has not tried anything for symptoms. No neck pain. Denies any recent URI or flulike symptoms. No history of stroke or heart disease.  She feels "unsteady on my feet ".  No history of labyrinthitis.  She describes it as "bothersome ".  Of note, she is also experiencing significant depression, and bothersome.  Her headaches are controlled.  She denies any vision changes such as double vision or blurred vision.  She denies any chest pain, palpitations, or shortness of breath.  No recent trips.  She did have a recent patella cyst surgery on with swelling of the left upper lid on February 10, 2022, without signs of preseptal  cellulitis or vitreitis but given Keflex from 02/13/2022 to 02/20/2022. ? ?As for her memory, is stable from prior visit as mentioned above, very tearful, she wants her husband to be more patient with her.  She is always looking for things, especially the phone her glucose sensor.  Manages her own medications, but is not sure about the compliance, does not want him to help her.  He manages the finances.  She does not drive.  She continues to cook daily, denying to leave the stove on.  No hallucinations.  She is independent of dressing and bathing.  Sleep is good, occasionally she has vivid dreams, denies any recent REM behavior since the last visit.  She has poor appetite, but she attributes this to depression.  Apparently, there was another death in the family.  Denies any diarrhea.  Her headaches are intermittent, and she has bilateral lower extremity tingling due to peripheral neuropathy, ameliorated with gabapentin 100 mg nightly.  She denies any neck pain at this time.  No further falls since her last visit. ? ? ?History on Initial Assessment 06/30/2021: This is a 71 year old right-handed woman with a history of diabetes, neuropathy, B12 deficiency, anxiety, presenting for evaluation of memory loss. She was previously evaluated by neurologist Dr. Lucia GaskinsAhern in 2017 for similar concerns. She has a family history of Alzheimer's disease in her sister who presented at age 69 and died at age 264. At that time, her husband reported noticing memory changes starting around 2014, she was getting lost driving, forgetting names of coworkers she had worked with for many years. She was repeating herself, losing things, forgetting things on the stove or ingredients while cooking. At that time, she endorsed depression, crying most days. She had a brain MRI without contrast in 11/2016 which I personally reviewed, no acute changes, brain volume normal for age, mild chronic microvascular disease. ? ?She states her memory is "bad." She  lives with her husband and Haley Bartlett. Haley Bartlett reports the memory changes in 2017 were "a little, not too bad," but have progressively gotten worse over the years. This morning she lost her coffee cup and could not recall if she drank coffee or where she put her cup. She is very forgetful. She has left the stove on. She drives very minimally, her family drives her places. She has always been bad with directions, but even when she crosses the street she does not look around, worse recently. She manages her own medications, Haley Bartlett checks behind and states she does pretty good with this. Her husband manages finances. She misplaces things frequently. Haley Bartlett notes sometimes she seems lost, there is a delay when answering questions like she has a hard time processing information. She had a hearing evaluation and it was not so bad. She used to sew really well, sewing wedding dresses, and now is unable to. She babysits her 4yo and 9yo grandchildren. She is also moody and argues with her husband, believing things a certain way and very adamant about it even if she is wrong. She understands conversations to be different. No hallucinations. She states she  has not been feeling well lately because of headaches. Headaches started a year ago, with pain in the back of her head occurring once a week. There is no nausea/vomiting, she does not take prn medication. She denies any diplopia, dysarthria/dysphagia, bowel dysfunction. She has neuropathy in her hands and feet and takes gabapentin 100mg  qhs. She has some urinary leakage when she does her walking. Sense of taste and smell are not good. She has occasional hand tremors when eating and writing. She does not sleep well, usually with 4-5 hours of sleep. She does not take naps. She had a fall in April and May with a lot of leg cramps. She reports her glucose levels go up and down, especially at night. Her paternal grandmother had Alzheimer's disease, as well as her older sister as  noted above. No history of significant head injuries or alcohol use.  ?   ? ?Lab Results  ?Component Value Date  ? HGBA1C 8.5 (A) 01/08/2022  ? ? ?PAST MEDICAL HISTORY: ?Past Medical History:  ?Diagnosis Date  ?

## 2022-03-26 NOTE — Patient Instructions (Addendum)
Que bueno verte. ? ? ?1. Comience con Memantina 10 mg luego aumente a 1 tableta dos veces al d?a ? ?2. Contin?e con donepezil 10 mg al d?a ? ?3. Seguimiento  May 12 a las 11:30  ? ?4 Tax adviser muchisimo  ? ?MRI de la cabeza para ver si el vertigo se puede explica ? ?Terapia vestibular  ? ? ? ? ? ? ? ? ? ? ?PRECAUCIONES DE CA?DA: Tenga cuidado al caminar. Escanee el ?rea en busca de obst?culos que puedan aumentar el riesgo de tropiezos y ca?das. Al levantarse por las ma?anas, si?ntese en el borde de la cama durante unos minutos antes de levantarse de la cama. Considere elevar la cama en la cabecera para evitar la ca?da de la presi?n arterial al levantarse. [place] siempre en una habitaci?n bien iluminada (utilice luces nocturnas en las paredes). Evite las alfombras o los cables el?ctricos de los electrodom?sticos en el medio de los pasillos. Use un andador o un bast?n si es necesario y considere la fisioterapia para el ejercicio del equilibrio. H?gase revisar la vista peri?dicamente. ? ?SUPERVISI?N FINANCIERA: Tambi?n se recomienda la supervisi?n, especialmente la supervisi?n al tomar decisiones o transacciones financieras. ? ?SEGURIDAD EN EL HOGAR: Considere la seguridad de la cocina cuando opere electrodom?sticos como estufas, hornos de microondas y licuadoras. Considere tener supervisi?n y compartir las responsabilidades de cocinar hasta que ya no pueda participar en ellas. Los accidentes con armas de fuego y otros peligros en la casa tambi?n deben identificarse y abordarse. ? ?CONDUCCI?N: Respecto a la conducci?n, en pacientes con problemas progresivos de memoria, la conducci?n se ver? perjudicada. Recomendamos que alguien m?s conduzca si hay problemas para encontrar direcciones o si se informan accidentes menores. La evaluaci?n de conducci?n independiente est? disponible para determinar la seguridad de la conducci?n. ? ?HABILIDAD PARA QUEDAR SOLO: Si el paciente no puede comunicarse con el operador del  911, considere usar [facility], o cuando sea necesario, haga arreglos para que alguien se quede con los [place]. Fumar es un peligro de incendio, considere la supervisi?n o Financial planner. El cuidador debe evaluar el riesgo de deambular y, si se detecta en alg?n momento, se deben instituir recomendaciones de supervisi?n y [place]. ? ?SUPERVISI?N DE MEDICAMENTOS: La incapacidad para autoadministrarse medicamentos debe abordarse constantemente. Implementar un mecanismo para garantizar la administraci?n segura de los medicamentos. ? ?RECOMENDACIONES PARA TODOS LOS PACIENTES CON PROBLEMAS DE MEMORIA: ?1. Contin?e haciendo ejercicio (se recomiendan 30 minutos de caminata todos los d?as o 3 horas cada semana) ?2. Aumente las interacciones sociales: contin?e yendo a la [place] y disfrute de las reuniones sociales con amigos y familiares. ?3. Come sano, evita las frituras y come m?s frutas y verduras ?4. Mantener niveles adecuados de presi?n arterial, az?car en la sangre y colesterol en la sangre. Reducir el riesgo de accidente cerebrovascular y enfermedad cardiovascular tambi?n ayuda a promover Set designer. ?5. Evite situaciones estresantes. Vive una vida sencilla y [place]. Organice su tiempo y prep?rese para el d?a siguiente con anticipaci?n. ?6. Duerma bien, evite cualquier interrupci?n del sue?o y evite cualquier distracci?n en el dormitorio que pueda interferir con la calidad [place] del sue?o. ?7. Evite el az?car, evite los dulces, ya que existe un fuerte v?nculo entre el consumo excesivo de az?car, la diabetes y el deterioro cognitivo ?Se ha demostrado que la dieta mediterr?nea ayuda a los pacientes a reducir el riesgo de trastornos progresivos de la memoria y reduce el riesgo cardiovascular. Esto incluye comer pescado, comer frutas y verduras de hoja  verde, nueces como almendras y [place], nueces y tambi?n usar aceite de [place]. Evite las comidas r?pidas y los alimentos fritos tanto como sea  posible. Evite los dulces y Ambulance person, ya que el uso de az?car se ha relacionado con el empeoramiento de la funci?n de la [place]. ? ?Siempre existe la preocupaci?n de la progresi?n gradual de los problemas de [place]. Si este es el caso, es posible que debamos ajustar el nivel de atenci?n de acuerdo con las necesidades del [place]. A continuaci?n, se debe poner en marcha el 63, tanto para el paciente como para Medical laboratory scientific officer. ? ? ? ?Good to see you. ? ? ?FALL PRECAUTIONS: Be cautious when walking. Scan the area for obstacles that may increase the risk of trips and falls. When getting up in the mornings, sit up at the edge of the bed for a few minutes before getting out of bed. Consider elevating the bed at the head end to avoid drop of blood pressure when getting up. Walk always in a well-lit room (use night lights in the walls). Avoid area rugs or power cords from appliances in the middle of the walkways. Use a walker or a cane if necessary and consider physical therapy for balance exercise. Get your eyesight checked regularly. ? ?FINANCIAL OVERSIGHT: Supervision, especially oversight when making financial decisions or transactions is also recommended. ? ?HOME SAFETY: Consider the safety of the kitchen when operating appliances like stoves, microwave oven, and blender. Consider having supervision and share cooking responsibilities until no longer able to participate in those. Accidents with firearms and other hazards in the house should be identified and addressed as well. ? ?DRIVING: Regarding driving, in patients with progressive memory problems, driving will be impaired. We advise to have someone else do the driving if trouble finding directions or if minor accidents are reported. Independent driving assessment is available to determine safety of driving. ? ?ABILITY TO BE LEFT ALONE: If patient is unable to contact 911 operator, consider using [facility], or when the need is there, arrange for someone to stay  with patients. Smoking is a fire hazard, consider supervision or cessation. Risk of wandering should be assessed by caregiver and if detected at any point, supervision and safe proof recommendations should be instituted. ? ?MEDICATION SUPERVISION: Inability to self-administer medication needs to be constantly addressed. Implement a mechanism to ensure safe administration of the medications. ? ?RECOMMENDATIONS FOR ALL PATIENTS WITH MEMORY PROBLEMS: ?1. Continue to exercise (Recommend 30 minutes of walking everyday, or 3 hours every week) ?2. Increase social interactions - continue going to [place] and enjoy social gatherings with friends and family ?3. Eat healthy, avoid fried foods and eat more fruits and vegetables ?4. Maintain adequate blood pressure, blood sugar, and blood cholesterol level. Reducing the risk of stroke and cardiovascular disease also helps promoting better memory. ?5. Avoid stressful situations. Live a simple life and avoid aggravations. Organize your time and prepare for the next day in anticipation. ?6. Sleep well, avoid any interruptions of sleep and avoid any distractions in the bedroom that may interfere with adequate sleep quality ?7. Avoid sugar, avoid sweets as there is a strong link between excessive sugar intake, diabetes, and cognitive impairment ?The Mediterranean diet has been shown to help patients reduce the risk of progressive memory disorders and reduces cardiovascular risk. This includes eating fish, eat fruits and green leafy vegetables, nuts like almonds and hazelnuts, walnuts, and also use olive oil. Avoid fast foods and fried foods as much as possible. Avoid sweets  and sugar as sugar use has been linked to worsening of memory function. ? ?There is always a concern of gradual progression of memory problems. If this is the case, then we may need to adjust level of care according to patient needs. Support, both to the patient and caregiver, should then be put into place. ?We  have sent a referral to [facility] for your MRI and they will call you directly to schedule your appointment. They are located at [place]. If you need to contact them directly please ca

## 2022-03-27 ENCOUNTER — Encounter: Payer: Self-pay | Admitting: Physician Assistant

## 2022-03-27 MED ORDER — PREDNISONE 10 MG (21) PO TBPK: ORAL_TABLET | ORAL | 0 refills | Status: AC

## 2022-03-27 NOTE — Telephone Encounter (Signed)
The prescription for prednisone 21 pill pack to take as directed has been given to the patient, for the treatment of vertigo, likely positional. ?

## 2022-03-30 ENCOUNTER — Ambulatory Visit: Payer: Medicare Other | Admitting: Endocrinology

## 2022-03-31 ENCOUNTER — Other Ambulatory Visit: Payer: Self-pay | Admitting: Endocrinology

## 2022-04-06 DIAGNOSIS — E1165 Type 2 diabetes mellitus with hyperglycemia: Secondary | ICD-10-CM | POA: Diagnosis not present

## 2022-04-08 ENCOUNTER — Other Ambulatory Visit: Payer: Medicare Other

## 2022-04-08 ENCOUNTER — Ambulatory Visit
Admission: RE | Admit: 2022-04-08 | Discharge: 2022-04-08 | Disposition: A | Discharge status: Home / Self Care | Payer: Medicare Other | Source: Ambulatory Visit | Attending: Physician Assistant | Admitting: Physician Assistant

## 2022-04-08 DIAGNOSIS — R42 Dizziness and giddiness: Secondary | ICD-10-CM | POA: Diagnosis not present

## 2022-04-08 MED ORDER — GADOBENATE DIMEGLUMINE 529 MG/ML IV SOLN
9.0000 mL | Freq: Once | INTRAVENOUS | Status: AC | PRN
  Administered 2022-04-08: 9 mL via INTRAVENOUS

## 2022-04-09 DIAGNOSIS — Z9889 Other specified postprocedural states: Secondary | ICD-10-CM | POA: Diagnosis not present

## 2022-04-09 DIAGNOSIS — Z48810 Encounter for surgical aftercare following surgery on the sense organs: Secondary | ICD-10-CM | POA: Diagnosis not present

## 2022-04-09 NOTE — Progress Notes (Signed)
Please inform patient that her MRI brain does not show any changes from prior. Her brain size is normal. Thanks

## 2022-04-17 ENCOUNTER — Encounter: Payer: Self-pay | Admitting: Internal Medicine

## 2022-04-17 ENCOUNTER — Ambulatory Visit: Payer: Medicare Other | Admitting: Internal Medicine

## 2022-04-17 ENCOUNTER — Ambulatory Visit (INDEPENDENT_AMBULATORY_CARE_PROVIDER_SITE_OTHER): Payer: Medicare Other | Admitting: Physician Assistant

## 2022-04-17 ENCOUNTER — Encounter: Payer: Self-pay | Admitting: Physician Assistant

## 2022-04-17 ENCOUNTER — Emergency Department (HOSPITAL_COMMUNITY)
Admission: EM | Admit: 2022-04-17 | Discharge: 2022-04-17 | Discharge status: Against Medical Advice | Payer: Medicare Other | Attending: Emergency Medicine | Admitting: Emergency Medicine

## 2022-04-17 ENCOUNTER — Other Ambulatory Visit: Payer: Self-pay

## 2022-04-17 ENCOUNTER — Emergency Department (HOSPITAL_COMMUNITY): Payer: Medicare Other

## 2022-04-17 ENCOUNTER — Encounter (HOSPITAL_COMMUNITY): Payer: Self-pay

## 2022-04-17 ENCOUNTER — Emergency Department (HOSPITAL_COMMUNITY)
Admission: EM | Admit: 2022-04-17 | Discharge: 2022-04-18 | Disposition: A | Discharge status: Home / Self Care | Payer: Medicare Other | Source: Home / Self Care | Attending: Emergency Medicine | Admitting: Emergency Medicine

## 2022-04-17 ENCOUNTER — Encounter (HOSPITAL_BASED_OUTPATIENT_CLINIC_OR_DEPARTMENT_OTHER): Payer: Self-pay

## 2022-04-17 VITALS — BP 113/73 | HR 78 | Resp 18 | Wt 95.0 lb

## 2022-04-17 VITALS — BP 112/74 | HR 80 | Ht 59.0 in | Wt 93.0 lb

## 2022-04-17 DIAGNOSIS — R4781 Slurred speech: Secondary | ICD-10-CM | POA: Diagnosis not present

## 2022-04-17 DIAGNOSIS — N390 Urinary tract infection, site not specified: Secondary | ICD-10-CM | POA: Diagnosis not present

## 2022-04-17 DIAGNOSIS — R479 Unspecified speech disturbances: Secondary | ICD-10-CM

## 2022-04-17 DIAGNOSIS — R9431 Abnormal electrocardiogram [ECG] [EKG]: Secondary | ICD-10-CM | POA: Diagnosis not present

## 2022-04-17 DIAGNOSIS — R42 Dizziness and giddiness: Secondary | ICD-10-CM

## 2022-04-17 DIAGNOSIS — I6782 Cerebral ischemia: Secondary | ICD-10-CM | POA: Diagnosis not present

## 2022-04-17 DIAGNOSIS — Z794 Long term (current) use of insulin: Secondary | ICD-10-CM

## 2022-04-17 DIAGNOSIS — Z5321 Procedure and treatment not carried out due to patient leaving prior to being seen by health care provider: Secondary | ICD-10-CM | POA: Diagnosis not present

## 2022-04-17 DIAGNOSIS — G300 Alzheimer's disease with early onset: Secondary | ICD-10-CM | POA: Diagnosis not present

## 2022-04-17 DIAGNOSIS — E119 Type 2 diabetes mellitus without complications: Secondary | ICD-10-CM | POA: Insufficient documentation

## 2022-04-17 DIAGNOSIS — R4182 Altered mental status, unspecified: Secondary | ICD-10-CM | POA: Insufficient documentation

## 2022-04-17 DIAGNOSIS — E11319 Type 2 diabetes mellitus with unspecified diabetic retinopathy without macular edema: Secondary | ICD-10-CM | POA: Diagnosis not present

## 2022-04-17 DIAGNOSIS — F02A0 Dementia in other diseases classified elsewhere, mild, without behavioral disturbance, psychotic disturbance, mood disturbance, and anxiety: Secondary | ICD-10-CM

## 2022-04-17 DIAGNOSIS — R7309 Other abnormal glucose: Secondary | ICD-10-CM | POA: Insufficient documentation

## 2022-04-17 DIAGNOSIS — R410 Disorientation, unspecified: Secondary | ICD-10-CM | POA: Diagnosis not present

## 2022-04-17 DIAGNOSIS — F0283 Dementia in other diseases classified elsewhere, unspecified severity, with mood disturbance: Secondary | ICD-10-CM | POA: Diagnosis not present

## 2022-04-17 DIAGNOSIS — S0990XA Unspecified injury of head, initial encounter: Secondary | ICD-10-CM | POA: Diagnosis not present

## 2022-04-17 DIAGNOSIS — F32A Depression, unspecified: Secondary | ICD-10-CM

## 2022-04-17 LAB — POCT GLYCOSYLATED HEMOGLOBIN (HGB A1C): Hemoglobin A1C: 8.6 % — AB (ref 4.0–5.6)

## 2022-04-17 LAB — COMPREHENSIVE METABOLIC PANEL
ALT: 19 U/L (ref 0–44)
AST: 19 U/L (ref 15–41)
Albumin: 3.8 g/dL (ref 3.5–5.0)
Alkaline Phosphatase: 82 U/L (ref 38–126)
Anion gap: 9 (ref 5–15)
BUN: 10 mg/dL (ref 8–23)
CO2: 23 mmol/L (ref 22–32)
Calcium: 9.8 mg/dL (ref 8.9–10.3)
Chloride: 103 mmol/L (ref 98–111)
Creatinine, Ser: 0.84 mg/dL (ref 0.44–1.00)
GFR, Estimated: >60 mL/min (ref >60)
Glucose, Bld: 200 mg/dL — ABNORMAL HIGH (ref 70–99)
Potassium: 4.2 mmol/L (ref 3.5–5.1)
Sodium: 135 mmol/L (ref 135–145)
Total Bilirubin: 0.8 mg/dL (ref 0.3–1.2)
Total Protein: 6.5 g/dL (ref 6.5–8.1)

## 2022-04-17 LAB — PROTIME-INR
INR: 0.9 (ref 0.8–1.2)
Prothrombin Time: 11.6 seconds (ref 11.4–15.2)

## 2022-04-17 LAB — CBC
HCT: 42.2 % (ref 36.0–46.0)
Hemoglobin: 14.1 g/dL (ref 12.0–15.0)
MCH: 32.8 pg (ref 26.0–34.0)
MCHC: 33.4 g/dL (ref 30.0–36.0)
MCV: 98.1 fL (ref 80.0–100.0)
Platelets: 188 10*3/uL (ref 150–400)
RBC: 4.3 MIL/uL (ref 3.87–5.11)
RDW: 12.2 % (ref 11.5–15.5)
WBC: 4.5 10*3/uL (ref 4.0–10.5)
nRBC: 0 % (ref 0.0–0.2)

## 2022-04-17 LAB — DIFFERENTIAL
Abs Immature Granulocytes: 0.01 10*3/uL (ref 0.00–0.07)
Basophils Absolute: 0 10*3/uL (ref 0.0–0.1)
Basophils Relative: 0 %
Eosinophils Absolute: 0.1 10*3/uL (ref 0.0–0.5)
Eosinophils Relative: 3 %
Immature Granulocytes: 0 %
Lymphocytes Relative: 23 %
Lymphs Abs: 1 10*3/uL (ref 0.7–4.0)
Monocytes Absolute: 0.4 10*3/uL (ref 0.1–1.0)
Monocytes Relative: 8 %
Neutro Abs: 2.9 10*3/uL (ref 1.7–7.7)
Neutrophils Relative %: 66 %

## 2022-04-17 LAB — I-STAT CHEM 8, ED
BUN: 12 mg/dL (ref 8–23)
Calcium, Ion: 1.22 mmol/L (ref 1.15–1.40)
Chloride: 101 mmol/L (ref 98–111)
Creatinine, Ser: 0.8 mg/dL (ref 0.44–1.00)
Glucose, Bld: 200 mg/dL — ABNORMAL HIGH (ref 70–99)
HCT: 41 % (ref 36.0–46.0)
Hemoglobin: 13.9 g/dL (ref 12.0–15.0)
Potassium: 4.2 mmol/L (ref 3.5–5.1)
Sodium: 136 mmol/L (ref 135–145)
TCO2: 25 mmol/L (ref 22–32)

## 2022-04-17 LAB — CBG MONITORING, ED: Glucose-Capillary: 185 mg/dL — ABNORMAL HIGH (ref 70–99)

## 2022-04-17 LAB — APTT: aPTT: 30 seconds (ref 24–36)

## 2022-04-17 IMAGING — CT CT HEAD W/O CM
4 series · 16 of 47 positions shown, 18 images · non-contrast
Comparison: CT done on [DATE] MR done on [DATE]

CLINICAL DATA: Altered mental status



[Series 3: head wo · axial · 0.39mm/px · z∈[-111,-1]mm · 7 of 30 slices shown, 9 images]
[im 4/30  brain]
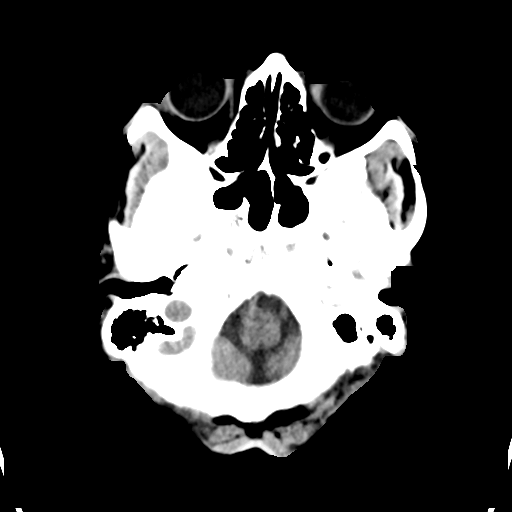
[im 4/30  bone]
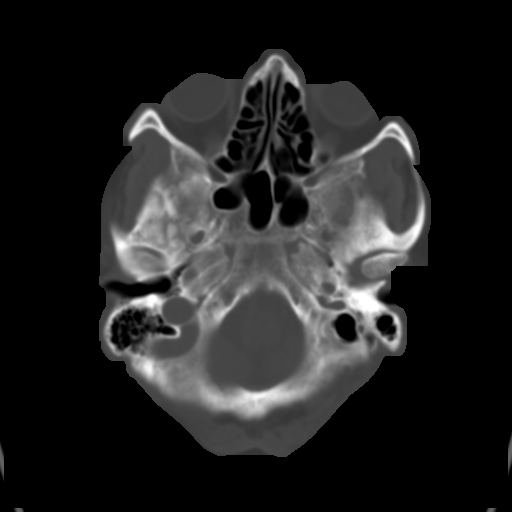
[im 8/30  brain]
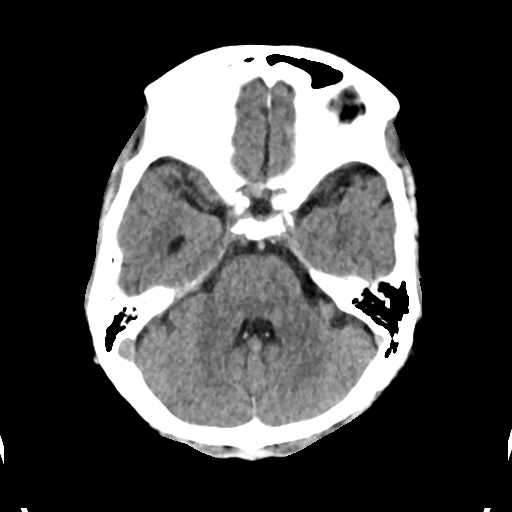
[im 11/30  brain]
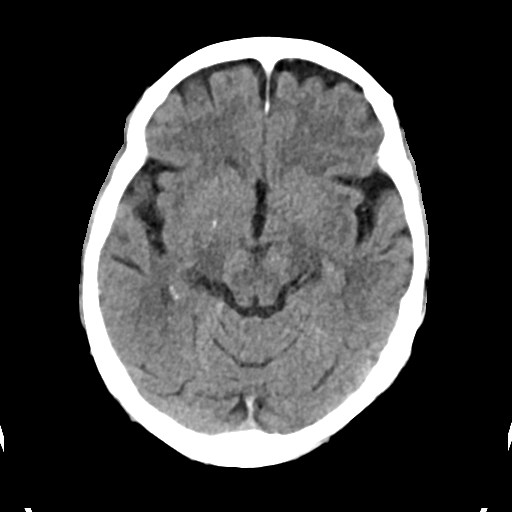
[im 15/30  brain]
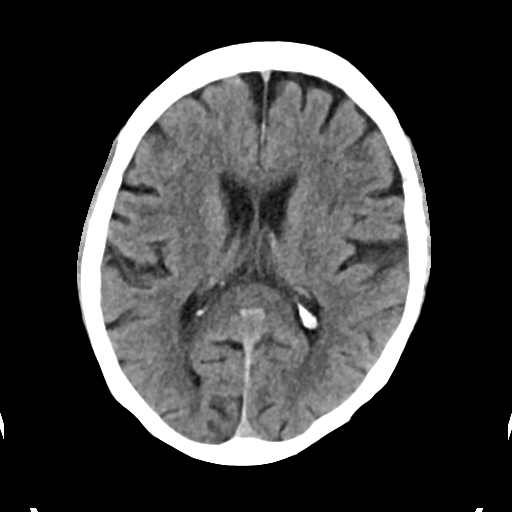
[im 19/30  brain]
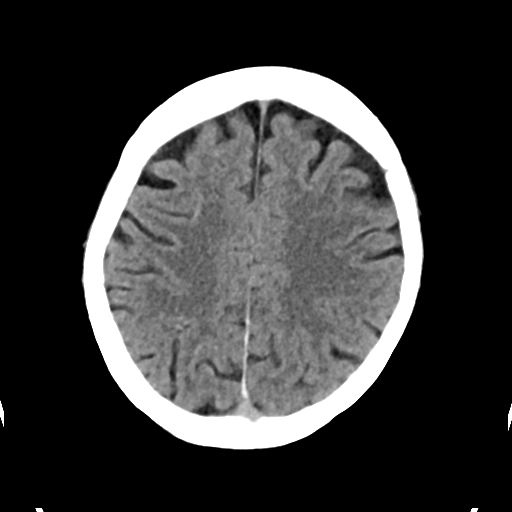
[im 19/30  bone]
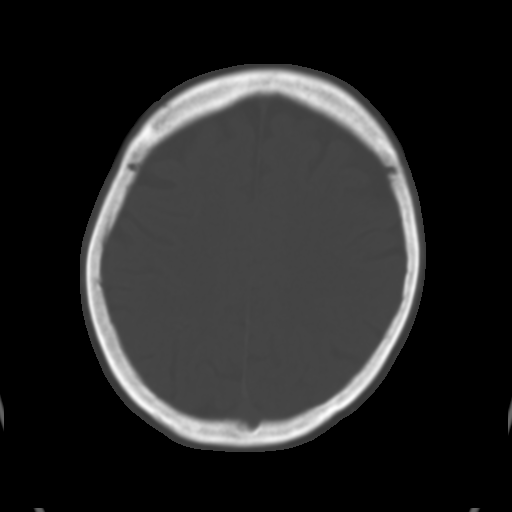
[im 22/30  brain]
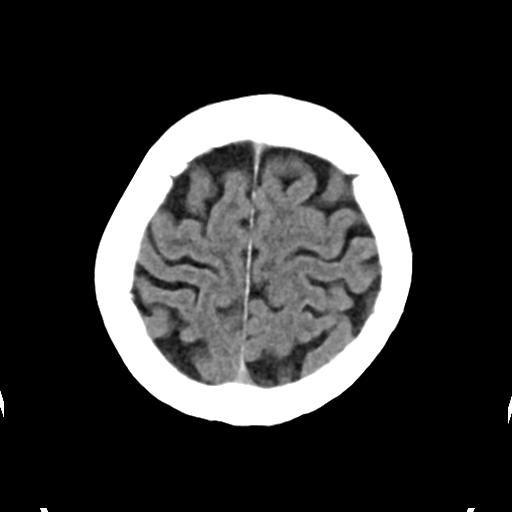
[im 26/30  brain]
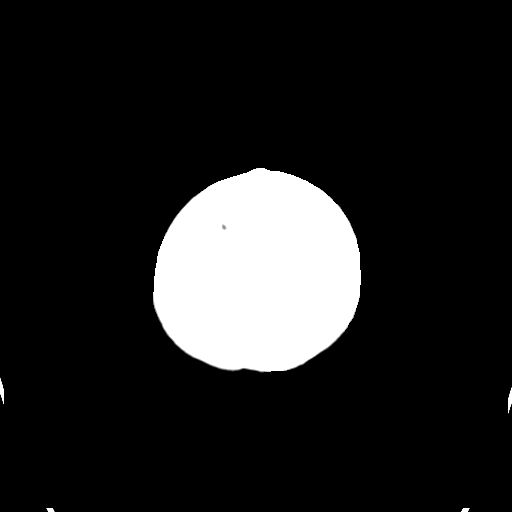

[Series 4: head bone · axial · 0.39mm/px · z∈[-112,-82]mm · 3 of 75 slices shown]
[im 8/75  bone]
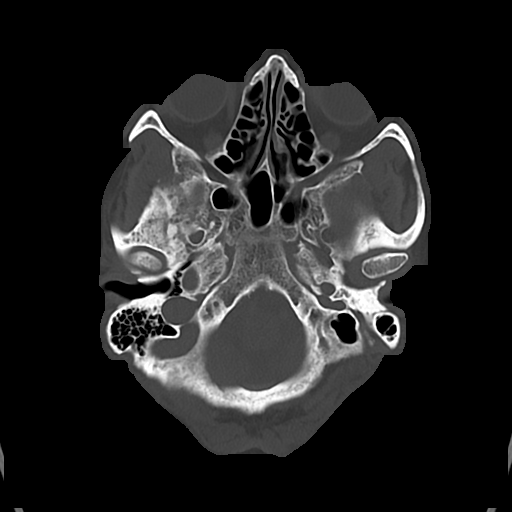
[im 15/75  bone]
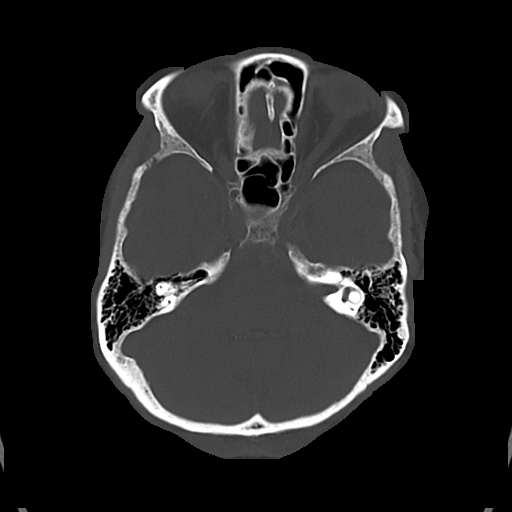
[im 23/75  bone]
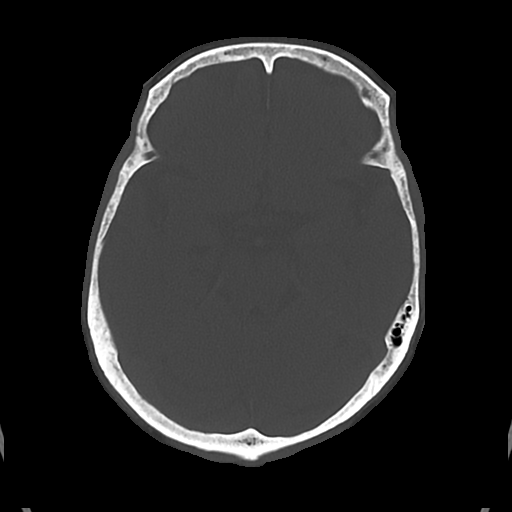

[Series 5: cor soft · coronal · 0.35mm/px · 3 of 64 slices shown]
[im 22/64  brain]
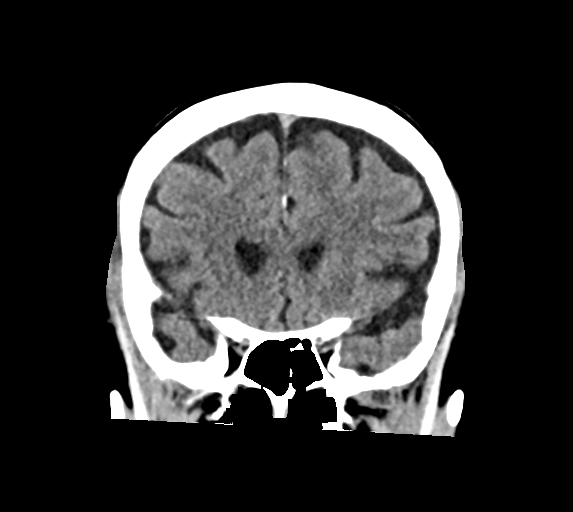
[im 29/64  brain]
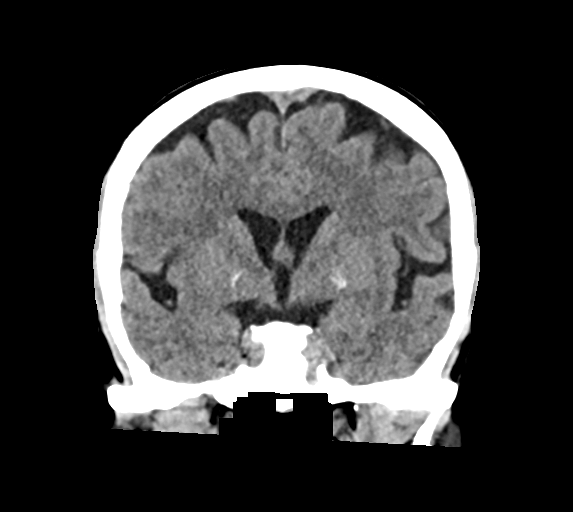
[im 36/64  brain]
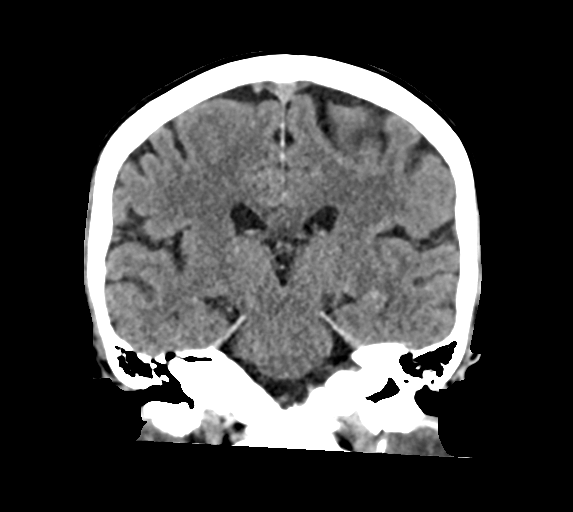

[Series 6: sag soft · sagittal · 0.33mm/px · 3 of 56 slices shown]
[im 19/56  brain]
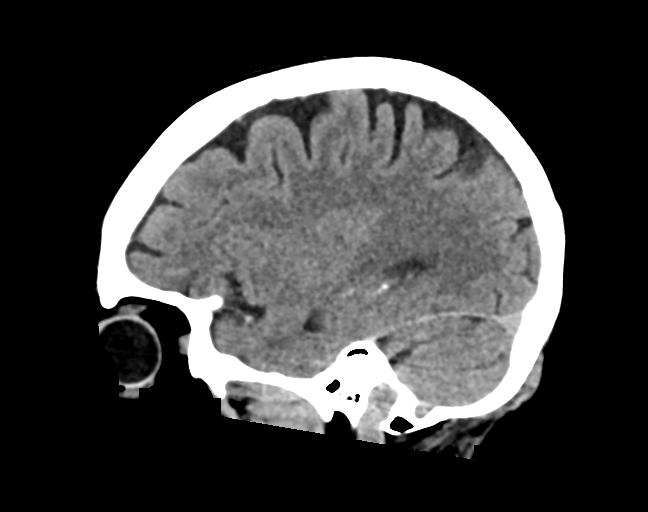
[im 28/56  brain]
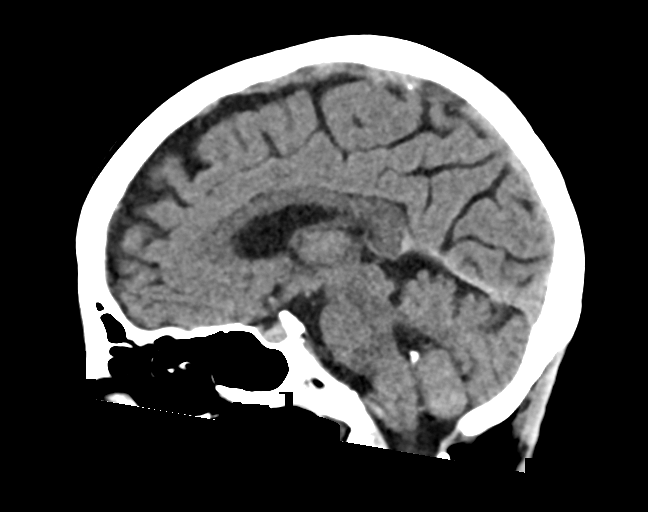
[im 37/56  brain]
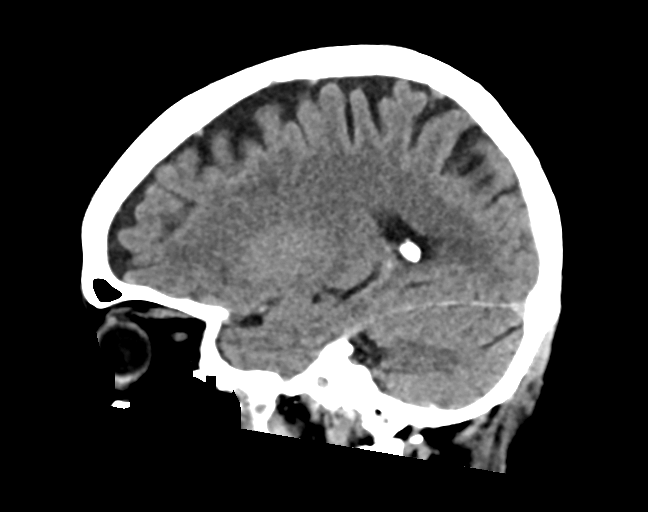

[16 of 47 positions shown; findings below may reference images not displayed]

FINDINGS: Brain: No acute intracranial findings are seen. There are no
epidural or subdural fluid collections. Calcifications are seen in
the basal ganglia. Cortical sulci are prominent.

Vascular: Scattered arterial calcifications are seen.

Skull: Unremarkable.

Sinuses/Orbits: There is mild mucosal thickening in the ethmoid
sinus.

Other: No significant changes are noted.
IMPRESSION: No acute intracranial findings are seen in noncontrast CT brain.
Atrophy.

## 2022-04-17 MED ORDER — INSULIN PEN NEEDLE 32G X 4 MM MISC: 3 refills | Status: DC

## 2022-04-17 MED ORDER — SODIUM CHLORIDE 0.9% FLUSH: 3.0000 mL | Freq: Once | INTRAVENOUS | Status: DC

## 2022-04-17 MED ORDER — INSULIN LISPRO (1 UNIT DIAL) 100 UNIT/ML (KWIKPEN): 4.0000 [IU] | PEN_INJECTOR | Freq: Three times a day (TID) | SUBCUTANEOUS | 3 refills | Status: DC

## 2022-04-17 MED ORDER — TRESIBA FLEXTOUCH 100 UNIT/ML ~~LOC~~ SOPN
10.0000 [IU] | PEN_INJECTOR | Freq: Every day | SUBCUTANEOUS | 3 refills | Status: DC
Start: 2022-04-17 — End: 2022-11-20

## 2022-04-17 NOTE — ED Triage Notes (Addendum)
Pt's daughter states her mother has a delayed response when answering questions and has slurred speech. Per husband, pt LKW 1030 today. Pt has 20mm pupil on left and 68mm pupil on right. ?

## 2022-04-17 NOTE — Progress Notes (Addendum)
Patient ID: Haley Bartlett, female   DOB: 22-Nov-1951, 71 y.o.   MRN: [id number] ? ?This visit occurred during the SARS-CoV-2 public health emergency.  Safety protocols were in place, including screening questions prior to the visit, additional usage of staff PPE, and extensive cleaning of exam room while observing appropriate contact time as indicated for disinfecting solutions.  ? ?HPI: ?Haley Bartlett is a 71 y.o.-year-old female, returning for follow-up for DM2, dx in 1993, on insulin since 2002, uncontrolled, with complications (DR).  She previously saw Dr. Loanne Drilling, last visit 1.5 months ago. ?Patient is late 2h for the appointment so she was seen at the end of the day. ? ?She is accompanied by her husband who translates for [place].  He also gives medical history since patient has memory loss. ?However, husband is unfamiliar with patient's regimen so at today's visit, we had to call the daughter which looked at the medicines at home and clarified what the patient was taking. ? ?Reviewed HbA1c: ?03/26/2022: HbA1c 9.0% ?Lab Results  ?Component Value Date  ? HGBA1C 8.5 (A) 01/08/2022  ? HGBA1C 8.2 (A) 09/30/2021  ? HGBA1C 7.8 (A) 08/26/2021  ? HGBA1C 6.9 (A) 04/22/2021  ? HGBA1C 6.9 (A) 02/18/2021  ?03/26/2022: C-peptide 1.6, glucose 217 ?08/13/2010: GAD antibody <1, Glu 164, C peptide 1.1 ? ?Pt is on a regimen of: ?- Metformin 500 mg 2x a day ?- Prandin 1 mg 3 times a day before meals ?- Actos 30 mg before breakfast ?- Farxiga 10 mg before breakfast ?- Humalog 11 units before the 3 meals  - only if sugars >300 ?She was taken off insulin but restarted 01/2022. ?Januvia and Rybelsus were too expensive. ? ?Pt checks her sugars >4x a day and they are: ? ? ?Lowest sugar was 120; she has hypoglycemia awareness at 70.  ?Highest sugar was HI. ? ?Glucometer: Accu-Chek guide ? ?- last BUN/creatinine:  ?Test Value / Unit Interpretation Reference Range  ?Comp. Metabolic Panel (84)[[id number]]     Collected: 03/26/2022 04:20 PM      Specimen Received: 03/26/2022 05:00 AM       ?Glucose [001032] 217 mg/dL H 70-99 mg/dL  ?BUN [001040] 18 mg/dL Normal 8-27 mg/dL  ?Creatinine [001370] 1.03 mg/dL H 0.57-1.00 mg/dL  ?eGFR [100779] 59 mL/min/1.73 L >59 mL/min/1.73  ?BUN/Creatinine Ratio [011577] 17 Normal 12-28  ?Sodium [001198] 135 mmol/L Normal 134-144 mmol/L  ?Potassium [001180] 4.5 mmol/L Normal 3.5-5.2 mmol/L  ?Chloride [001206] 99 mmol/L Normal 96-106 mmol/L  ?Carbon Dioxide, Total [001578] 23 mmol/L Normal 20-29 mmol/L  ?Calcium [001016] 9.9 mg/dL Normal 8.7-10.3 mg/dL  ?Protein, Total [001073] 6.9 g/dL Normal 6.0-8.5 g/dL  ?Albumin [001081] 4.4 g/dL Normal 3.8-4.8 g/dL  ?Globulin, Total [012039] 2.5 g/dL Normal 1.5-4.5 g/dL  ?A/G Ratio [012047] 1.8 Normal 1.2-2.2  ?Bilirubin, Total [001099] 0.3 mg/dL Normal 0.0-1.2 mg/dL  ?Alkaline Phosphatase [001107] 88 IU/L Normal 44-121 IU/L  ?AST (SGOT) [001123] 22 IU/L Normal 0-40 IU/L  ?ALT (SGPT) [001545] 19 IU/L Normal 0-32 IU/L  ? ?Albumin/Creatinine Ratio,Urine[[id number]]     Collected: 03/26/2022 04:20 PM     Specimen Received: 03/26/2022 05:00 AM       ?Creatinine, Urine [013672] 54.3 mg/dL Normal Not Estab. mg/dL  ?Albumin, Urine [140097] 6.3 ug/mL Normal Not Estab. ug/mL  ?Alb/Creat Ratio [140291] 12 mg/g creat Normal 0-29 mg/g creat  ?Normal: 0 - 29 Moderately increased: 30 - 300 Severely increased: >300  ? ?Lab Results  ?Component Value Date  ? BUN 13 10/23/2016  ? BUN 25 (H) 05/09/2011  ?  CREATININE 0.86 10/23/2016  ? CREATININE 0.65 05/09/2011  ?She is not on ACE inhibitor/ARB. ? ?-+ HL; last set of lipids: ?Lipid Panel[[id number]]     Collected: 03/26/2022 04:20 PM     Specimen Received: 03/26/2022 05:00 AM       ?Cholesterol, Total [001065] 247 mg/dL H 100-199 mg/dL  ?Triglycerides [001172] 85 mg/dL Normal 0-149 mg/dL  ?HDL Cholesterol [011817] 104 mg/dL Normal >39 mg/dL  ?VLDL Cholesterol Cal [011919] 14 mg/dL Normal 5-40 mg/dL  ?LDL Chol Calc (NIH) [012059] 129 mg/dL H 0-99 mg/dL  ?No  results found for: CHOL, HDL, LDLCALC, LDLDIRECT, TRIG, CHOLHDL ?She is intolerant to statins. ? ?- last eye exam was in 02/2022: + DR.  ? ?- + numbness and tingling in her feet.  Last foot exam 09/2021.  She is on Neurontin 100 mg at bedtime ? ?She also has hypothyroidism - on levothyroxine 25 mcg daily, taken: ?- in am ?- fasting ?- at least 1h from b'fast ?- no calcium ?- no iron ?- + multivitamins at lunchtime ?- no PPIs ?- not on Biotin ? ?TSH levels: ? 2022-03-17     ?TSH 2.00   0.34-4.50  ? ?No results found for: TSH ? ?ROS: ?+ See HPI ?No increased urination, blurry vision, nausea, chest pain. ? ?Past Medical History:  ?Diagnosis Date  ? Depression   ? ?Past Surgical History:  ?Procedure Laterality Date  ? ABDOMINAL HYSTERECTOMY    ? APPENDECTOMY    ? THYROIDECTOMY    ? ?Social History  ? ?Socioeconomic History  ? Marital status: Married  ?  Spouse name: Haley Bartlett  ? Number of children: 2  ? Years of education: 58  ? Highest education level: Not on file  ?Occupational History  ? Occupation: retired  ?Tobacco Use  ? Smoking status: Never  ? Smokeless tobacco: Never  ?Vaping Use  ? Vaping Use: Never used  ?Substance and Sexual Activity  ? Alcohol use: No  ? Drug use: No  ? Sexual activity: Not on file  ?Other Topics Concern  ? Not on file  ?Social History Narrative  ? Lives with Haley Bartlett, husband  ? Caffeine use: Coffee daily  ? Right handed   ? ?Social Determinants of Health  ? ?Financial Resource Strain: Not on file  ?Food Insecurity: Not on file  ?Transportation Needs: Not on file  ?Physical Activity: Not on file  ?Stress: Not on file  ?Social Connections: Not on file  ?Intimate Partner Violence: Not on file  ? ?Current Outpatient Medications on File Prior to Visit  ?Medication Sig Dispense Refill  ? donepezil (ARICEPT) 10 MG tablet Take 1 tablet daily 30 tablet 11  ? dorzolamide-timolol (COSOPT) 22.3-6.8 MG/ML ophthalmic solution Place 1 drop into both eyes 2 (two) times daily.    ? FARXIGA 10 MG TABS tablet  TAKE 1 TABLET BY MOUTH ONCE DAILY BEFORE BREAKFAST 30 tablet 0  ? gabapentin (NEURONTIN) 100 MG capsule Take 100 mg by mouth at bedtime. Taking 1 qhs    ? glucose blood (ACCU-CHEK GUIDE) test strip 1 each by Other route 2 (two) times daily. And lancets 2/day 200 each 3  ? insulin lispro (HUMALOG KWIKPEN) 100 UNIT/ML KwikPen Inject 11 Units into the skin 3 (three) times daily with meals. And pen needles 1/day 30 mL 3  ? latanoprost (XALATAN) 0.005 % ophthalmic solution     ? levothyroxine (SYNTHROID, LEVOTHROID) 25 MCG tablet Take 25 mcg by mouth daily before breakfast.    ? memantine (NAMENDA) 10 MG tablet  Take 1 tablet every night for 2 weeks, then increase to 1 tablet twice a day 60 tablet 11  ? pioglitazone (ACTOS) 30 MG tablet Take 1 tablet (30 mg total) by mouth daily. 90 tablet 3  ? repaglinide (PRANDIN) 1 MG tablet Take 1 tablet (1 mg total) by mouth 3 (three) times daily before meals. 270 tablet 3  ? sertraline (ZOLOFT) 25 MG tablet Take 25 mg by mouth daily. 1.5 tab daily    ? ?No current facility-administered medications on file prior to visit.  ? ?Allergies  ?Allergen Reactions  ? Atorvastatin Other (See Comments)  ? ?Family History  ?Problem Relation Age of Onset  ? Dementia Sister   ? Diabetes Sister   ? Diabetes Mother   ? Diabetes Brother   ? ?PE: ?BP 112/74 (BP Location: Left Arm, Patient Position: Sitting, Cuff Size: Normal)   Pulse 80   Ht $R'4\' 11"'uK$  (1.499 m)   Wt 93 lb (42.2 kg)   SpO2 98%   BMI 18.78 kg/m?  ?Wt Readings from Last 3 Encounters:  ?04/17/22 93 lb (42.2 kg)  ?04/17/22 95 lb (43.1 kg)  ?03/26/22 94 lb (42.6 kg)  ? ?Constitutional: underweight, in NAD ?Eyes: PERRLA, EOMI, no exophthalmos ?ENT: moist mucous membranes, no thyromegaly, no cervical lymphadenopathy ?Cardiovascular: RRR, No MRG ?Respiratory: CTA B ?Musculoskeletal: no deformities, strength intact in all 4 ?Skin: moist, warm, no rashes ?Neurological: no tremor with outstretched hands, DTR normal in all 4 ? ?ASSESSMENT: ?1.  DM2, insulin-dependent, uncontrolled, with complications ?- DR ? ?2. HL ? ?3.  Hypothyroidism ? ?4.  Confusion ? ?PLAN:  ?1. Patient with long-standing, uncontrolled diabetes, on oral antidiabetic regimen, which

## 2022-04-17 NOTE — ED Triage Notes (Signed)
Pt comes with AMS, LSN this morning 1030 am pt is now altered and slow to respond, neuro intact, slight R sided facial droop.  ?

## 2022-04-17 NOTE — Progress Notes (Signed)
? ?NEUROLOGY FOLLOW UP OFFICE NOTE ? ?Haley Bartlett ?[id number] ?[birth year] ? ? ? ?IMPRESSION: ? 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 ?This is a 71 yo RH woman with a history of diabetes, neuropathy, B12 deficiency, anxiety, with early onset dementia concerning for Alzheimer's disease. MOCA 10/30 in 06/2021.  Her husband reports that her memory is about the same.  The patient is on donepezil 10 mg daily and memantine 10 mg twice daily, tolerating well.  However, her depression is very significant, and she has not attended her appointment with psychiatry.  This was recommended very strongly for medication management, as her mood is affecting her memory.  MRI of the brain is essentially unchanged from prior. ?Continue memantine 10 mg twice daily ?Continue donepezil 10 mg daily ?Strongly recommend a visit with psychiatry for the treatment of her mood and with medications ? ? ?Recurrent Vertigo ?Unclear etiology.  MRI of the brain does not reveal any structural abnormality to cause vertigo.  Last episode about 1 month ago, and was treated with steroids.  The patient refuses to attend vestibular therapy.  This is suspected that is benign positional.  No recent infections.  No allergies.  No cardiac or stroke complaints.  No recent falls, head injuries or loss of consciousness.  The patient has a longstanding history of depression, her symptoms could be associated to a certain extent to mood changes.  She denies any dizziness or nausea.  No recent infections.  She denies any allergies.  She denies any cardiac or stroke complaints.   ?Strongly recommend that she attend vestibular therapy. ?Follow-up in 6 months. ? ?HISTORY OF PRESENT ILLNESS: ? ?I had the pleasure of seeing Haley Bartlett in follow-up in the neurology clinic on 04/17/2022.  She was last seen on 03/26/2022 at which time she had issues with memory loss as well as vertigo.  For vertigo, she was treated with steroid taper, with some results.   Of note, her MRI of the brain did not show any acute findings, was essentially unchanged from prior.  Regarding her memory, the patient has some "good and some bad days.  According to her husband, the patient is very depressed today and does not want to participate in any conversation.  She is visibly tearful.  She describes it as "bothersome ".  When questioned "do you know why you are depressed?,  The patient says "yes ", but does not wish to talk about it .  Her husband states that she still losing things, especially the phone and a glucose sensor disorder.  She continues to manage her own medications but it is unclear how compliant she is.  She does not want him to help her.  He manages the finances.  She does not drive.  She has not been cooking lately.  Her appetite is decreased.  Denies hallucinations.  She is independent of the dressing and bathing.  She sleeps "too much ".  Occasionally she has vivid dreams, denies any recent REM behavior.  Denies any GI complaints, denies any urine incontinence.  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  Disease improved with gabapentin.  Denies any falls. ? ? ? ?History on Initial Assessment 06/30/2021: This is a 71 year old right-handed woman with a history of diabetes, neuropathy, B12 deficiency, anxiety, presenting for evaluation of memory loss. She was previously evaluated by neurologist Dr. Lucia Gaskins in 2017 for similar concerns. She has a family history of Alzheimer's disease  in her sister who presented at age 69 and died at age 464. At that time, her husband reported noticing memory changes starting around 2014, she was getting lost driving, forgetting names of coworkers she had worked with for many years. She was repeating herself, losing things, forgetting things on the stove or ingredients while cooking. At that time, she endorsed depression, crying most days. She had a brain MRI without contrast in 11/2016 which I  personally reviewed, no acute changes, brain volume normal for age, mild chronic microvascular disease. ? ?She states her memory is "bad." She lives with her husband and Haley Bartlett. Haley Bartlett reports the memory changes in 2017 were "a little, not too bad," but have progressively gotten worse over the years. This morning she lost her coffee cup and could not recall if she drank coffee or where she put her cup. She is very forgetful. She has left the stove on. She drives very minimally, her family drives her places. She has always been bad with directions, but even when she crosses the street she does not look around, worse recently. She manages her own medications, Haley Bartlett checks behind and states she does pretty good with this. Her husband manages finances. She misplaces things frequently. Haley Bartlett notes sometimes she seems lost, there is a delay when answering questions like she has a hard time processing information. She had a hearing evaluation and it was not so bad. She used to sew really well, sewing wedding dresses, and now is unable to. She babysits her 4yo and 9yo grandchildren. She is also moody and argues with her husband, believing things a certain way and very adamant about it even if she is wrong. She understands conversations to be different. No hallucinations. She states she has not been feeling well lately because of headaches. Headaches started a year ago, with pain in the back of her head occurring once a week. There is no nausea/vomiting, she does not take prn medication. She denies any diplopia, dysarthria/dysphagia, bowel dysfunction. She has neuropathy in her hands and feet and takes gabapentin 100mg  qhs. She has some urinary leakage when she does her walking. Sense of taste and smell are not good. She has occasional hand tremors when eating and writing. She does not sleep well, usually with 4-5 hours of sleep. She does not take naps. She had a fall in April and May with a lot of leg cramps. She  reports her glucose levels go up and down, especially at night. Her paternal grandmother had Alzheimer's disease, as well as her older sister as noted above. No history of significant head injuries or alcohol use.  ?   ? ?Lab Results  ?Component Value Date  ? HGBA1C 8.5 (A) 01/08/2022  ? ? ?PAST MEDICAL HISTORY: ?Past Medical History:  ?Diagnosis Date  ? Depression   ? ? ?MEDICATIONS: ?Current Outpatient Medications on File Prior to Visit  ?Medication Sig Dispense Refill  ? donepezil (ARICEPT) 10 MG tablet Take 1 tablet daily 30 tablet 11  ? dorzolamide-timolol (COSOPT) 22.3-6.8 MG/ML ophthalmic solution Place 1 drop into both eyes 2 (two) times daily.    ? FARXIGA 10 MG TABS tablet TAKE 1 TABLET BY MOUTH ONCE DAILY BEFORE BREAKFAST 30 tablet 0  ? gabapentin (NEURONTIN) 100 MG capsule Take 100 mg by mouth at bedtime. Taking 1 qhs    ? glucose blood (ACCU-CHEK GUIDE) test strip 1 each by Other route 2 (two) times daily. And lancets 2/day 200 each 3  ? insulin lispro (HUMALOG KWIKPEN)  100 UNIT/ML KwikPen Inject 11 Units into the skin 3 (three) times daily with meals. And pen needles 1/day 30 mL 3  ? latanoprost (XALATAN) 0.005 % ophthalmic solution     ? levothyroxine (SYNTHROID, LEVOTHROID) 25 MCG tablet Take 25 mcg by mouth daily before breakfast.    ? memantine (NAMENDA) 10 MG tablet Take 1 tablet every night for 2 weeks, then increase to 1 tablet twice a day 60 tablet 11  ? pioglitazone (ACTOS) 30 MG tablet Take 1 tablet (30 mg total) by mouth daily. 90 tablet 3  ? repaglinide (PRANDIN) 1 MG tablet Take 1 tablet (1 mg total) by mouth 3 (three) times daily before meals. 270 tablet 3  ? sertraline (ZOLOFT) 25 MG tablet Take 25 mg by mouth daily. 1.5 tab daily    ? ?No current facility-administered medications on file prior to visit.  ? ? ?ALLERGIES: ?Allergies  ?Allergen Reactions  ? Atorvastatin Other (See Comments)  ? ? ?FAMILY HISTORY: ?Family History  ?Problem Relation Age of Onset  ? Dementia Sister   ?  Diabetes Sister   ? Diabetes Mother   ? Diabetes Brother   ? ? ?SOCIAL HISTORY: ?Social History  ? ?Socioeconomic History  ? Marital status: Married  ?  Spouse name: Oswaldo Done  ? Number of children: 2  ? Years of educati

## 2022-04-17 NOTE — Patient Instructions (Addendum)
Please stop: ?- Prandin ?- Actos  ?Haley Bartlett ? ?Continue: ?- Metformin 500 mg 2x a day ? ?Please start: ?- Tresiba 10 units daily and increase by 2 units every 2 days until the sugars in am are <150 ?- Humalog 4-5 units 15 min before each meal and increase the dose as needed  ? ?Continue Levothyroxine 25 mcg daily. ? ?Take the thyroid hormone every day, with water, at least 30 minutes before breakfast, separated by at least 4 hours from: ?- acid reflux medications ?- calcium ?- iron ?- multivitamins ? ?Please return in 2 months with your sugar log.  ? ?

## 2022-04-17 NOTE — ED Provider Notes (Signed)
6:15 PM-evaluated at triage for concern of code stroke.  Patient is conversant, without signs for acute neurologic abnormality.  She is currently with her daughter who is translating for her.  She speaks [place].  Patient describes being under arrest, then she states that she is not under stress.  She answers all questions and does not appear to have aphasia but takes a bit of time to respond.  She has good strength in the arms and legs bilaterally.  There is possibly very slight right facial drooping however she is able to activate the right facial muscles.  She appears nontoxic.  Her blood pressure is normal.  Will screen for acute infectious or metabolic disorders and get a screening head CT.  She does not meet criteria for code stroke. ?  ?Daleen Bo, MD ?04/17/22 1824 ? ?

## 2022-04-17 NOTE — Patient Instructions (Signed)
Que bueno verte. ? ? ?1. Continue  con Memantina 10 mg   1 tableta dos veces al d?a ? ?2. Contin?e con donepezil 10 mg al d?a ? ?3. Seguimiento en 6 mese  ? ?4 Monitoree el azucar muchisimo  ? ?Terapia vestibular  ? ?Recomiendo que vea a psiquiatria para tratar la depresion, ? ? ? ? ? ? ? ? ? ? ?PRECAUCIONES DE CA?DA: Tenga cuidado al caminar. Escanee el ?rea en busca de obst?culos que puedan aumentar el riesgo de tropiezos y ca?das. Al levantarse por las ma?anas, si?ntese en el borde de la cama durante unos minutos antes de levantarse de la cama. Considere elevar la cama en la cabecera para evitar la ca?da de la presi?n arterial al levantarse. Camine siempre en una habitaci?n bien iluminada (utilice luces nocturnas en las paredes). Evite las alfombras o los cables el?ctricos de los electrodom?sticos en el medio de los pasillos. Use un andador o un bast?n si es necesario y considere la fisioterapia para el ejercicio del equilibrio. H?gase revisar la vista peri?dicamente. ? ?SUPERVISI?N FINANCIERA: Tambi?n se recomienda la supervisi?n, especialmente la supervisi?n al tomar decisiones o transacciones financieras. ? ?SEGURIDAD EN EL HOGAR: Considere la seguridad de la cocina cuando opere electrodom?sticos como estufas, hornos de microondas y licuadoras. Considere tener supervisi?n y compartir las responsabilidades de cocinar hasta que ya no pueda participar en ellas. Los accidentes con armas de fuego y otros peligros en la casa tambi?n deben identificarse y abordarse. ? ?CONDUCCI?N: Respecto a la conducci?n, en pacientes con problemas progresivos de memoria, la conducci?n se ver? perjudicada. Recomendamos que alguien m?s conduzca si hay problemas para encontrar direcciones o si se informan accidentes menores. La evaluaci?n de conducci?n independiente est? disponible para determinar la seguridad de la conducci?n. ? ?HABILIDAD PARA QUEDAR SOLO: Si el paciente no puede comunicarse con el operador del 911, considere usar  [employer], o cuando sea necesario, haga arreglos para que alguien se quede con los [place]. Fumar es un peligro de incendio, considere la supervisi?n o Passenger transport manager. El cuidador debe evaluar el riesgo de deambular y, si se detecta en alg?n momento, se deben instituir recomendaciones de supervisi?n y pruebas seguras. ? ?SUPERVISI?N DE MEDICAMENTOS: La incapacidad para autoadministrarse medicamentos debe abordarse constantemente. Implementar un mecanismo para garantizar la administraci?n segura de los medicamentos. ? ?RECOMENDACIONES PARA TODOS LOS PACIENTES CON PROBLEMAS DE MEMORIA: ?1. Contin?e haciendo ejercicio (se recomiendan 30 minutos de caminata todos los d?as o 3 horas cada semana) ?2. Aumente las interacciones sociales: contin?e yendo a la [place] y disfrute de las reuniones sociales con amigos y familiares. ?3. Come sano, evita las frituras y come m?s frutas y verduras ?4. Mantener niveles adecuados de presi?n arterial, az?car en la sangre y colesterol en la sangre. Reducir el riesgo de accidente cerebrovascular y enfermedad cardiovascular tambi?n ayuda a promover Art gallery manager. ?5. Evite situaciones estresantes. Vive una vida sencilla y evita las [place]. Organice su tiempo y prep?rese para el d?a siguiente con anticipaci?n. ?6. Duerma bien, evite cualquier interrupci?n del sue?o y evite cualquier distracci?n en el dormitorio que pueda interferir con la calidad [place] del sue?o. ?7. Evite el az?car, evite los dulces, ya que existe un fuerte v?nculo entre el consumo excesivo de az?car, la diabetes y el deterioro cognitivo ?Se ha demostrado que la dieta mediterr?nea ayuda a los pacientes a reducir el riesgo de trastornos progresivos de la memoria y reduce el riesgo cardiovascular. Esto incluye comer pescado, comer frutas y verduras de [employer], nueces como almendras y [place],  nueces y tambi?n usar aceite de [place]. Evite las comidas r?pidas y los alimentos fritos tanto como sea posible. Evite los  dulces y Armed forces training and education officer, ya que el uso de az?car se ha relacionado con el empeoramiento de la funci?n de la [place]. ? ?Siempre existe la preocupaci?n de la progresi?n gradual de los problemas de [place]. Si este es [address], es posible que debamos ajustar el nivel de atenci?n de acuerdo con las necesidades del [place]. A continuaci?n, se debe poner en marcha el apoyo, tanto para el paciente como para Public librarian. ? ? ? ?Good to see you. ? ? ?FALL PRECAUTIONS: Be cautious when walking. Scan the area for obstacles that may increase the risk of trips and falls. When getting up in the mornings, sit up at the edge of the bed for a few minutes before getting out of bed. Consider elevating the bed at the head end to avoid drop of blood pressure when getting up. Walk always in a well-lit room (use night lights in the walls). Avoid area rugs or power cords from appliances in the middle of the walkways. Use a walker or a cane if necessary and consider physical therapy for balance exercise. Get your eyesight checked regularly. ? ?FINANCIAL OVERSIGHT: Supervision, especially oversight when making financial decisions or transactions is also recommended. ? ?HOME SAFETY: Consider the safety of the kitchen when operating appliances like stoves, microwave oven, and blender. Consider having supervision and share cooking responsibilities until no longer able to participate in those. Accidents with firearms and other hazards in the house should be identified and addressed as well. ? ?DRIVING: Regarding driving, in patients with progressive memory problems, driving will be impaired. We advise to have someone else do the driving if trouble finding directions or if minor accidents are reported. Independent driving assessment is available to determine safety of driving. ? ?ABILITY TO BE LEFT ALONE: If patient is unable to contact 911 operator, consider using [employer], or when the need is there, arrange for someone to stay with patients.  Smoking is a fire hazard, consider supervision or cessation. Risk of wandering should be assessed by caregiver and if detected at any point, supervision and safe proof recommendations should be instituted. ? ?MEDICATION SUPERVISION: Inability to self-administer medication needs to be constantly addressed. Implement a mechanism to ensure safe administration of the medications. ? ?RECOMMENDATIONS FOR ALL PATIENTS WITH MEMORY PROBLEMS: ?1. Continue to exercise (Recommend 30 minutes of walking everyday, or 3 hours every week) ?2. Increase social interactions - continue going to [place] and enjoy social gatherings with friends and family ?3. Eat healthy, avoid fried foods and eat more fruits and vegetables ?4. Maintain adequate blood pressure, blood sugar, and blood cholesterol level. Reducing the risk of stroke and cardiovascular disease also helps promoting better memory. ?5. Avoid stressful situations. Live a simple life and avoid aggravations. Organize your time and prepare for the next day in anticipation. ?6. Sleep well, avoid any interruptions of sleep and avoid any distractions in the bedroom that may interfere with adequate sleep quality ?7. Avoid sugar, avoid sweets as there is a strong link between excessive sugar intake, diabetes, and cognitive impairment ?The Mediterranean diet has been shown to help patients reduce the risk of progressive memory disorders and reduces cardiovascular risk. This includes eating fish, eat fruits and green leafy vegetables, nuts like almonds and hazelnuts, walnuts, and also use olive oil. Avoid fast foods and fried foods as much as possible. Avoid sweets and sugar as sugar use has  been linked to worsening of memory function. ? ?There is always a concern of gradual progression of memory problems. If this is the case, then we may need to adjust level of care according to patient needs. Support, both to the patient and caregiver, should then be put into place. ?We [facility]  [facility] and they will call you directly to schedule your appointment. They are located at [address] If you need to contact them directly please call [phone].  ?

## 2022-04-18 ENCOUNTER — Emergency Department (HOSPITAL_COMMUNITY): Payer: Medicare Other

## 2022-04-18 ENCOUNTER — Encounter (HOSPITAL_COMMUNITY): Payer: Self-pay

## 2022-04-18 DIAGNOSIS — I6782 Cerebral ischemia: Secondary | ICD-10-CM | POA: Diagnosis not present

## 2022-04-18 DIAGNOSIS — S0990XA Unspecified injury of head, initial encounter: Secondary | ICD-10-CM | POA: Diagnosis not present

## 2022-04-18 DIAGNOSIS — R410 Disorientation, unspecified: Secondary | ICD-10-CM | POA: Diagnosis not present

## 2022-04-18 LAB — URINALYSIS, ROUTINE W REFLEX MICROSCOPIC
Bilirubin Urine: NEGATIVE
Glucose, UA: NEGATIVE mg/dL
Hgb urine dipstick: NEGATIVE
Ketones, ur: NEGATIVE mg/dL
Nitrite: NEGATIVE
Protein, ur: NEGATIVE mg/dL
Specific Gravity, Urine: 1.008 (ref 1.005–1.030)
pH: 7.5 (ref 5.0–8.0)

## 2022-04-18 LAB — CBC WITH DIFFERENTIAL/PLATELET
Abs Immature Granulocytes: 0.01 10*3/uL (ref 0.00–0.07)
Basophils Absolute: 0 10*3/uL (ref 0.0–0.1)
Basophils Relative: 1 %
Eosinophils Absolute: 0.1 10*3/uL (ref 0.0–0.5)
Eosinophils Relative: 2 %
HCT: 42.1 % (ref 36.0–46.0)
Hemoglobin: 14.6 g/dL (ref 12.0–15.0)
Immature Granulocytes: 0 %
Lymphocytes Relative: 22 %
Lymphs Abs: 0.9 10*3/uL (ref 0.7–4.0)
MCH: 33 pg (ref 26.0–34.0)
MCHC: 34.7 g/dL (ref 30.0–36.0)
MCV: 95 fL (ref 80.0–100.0)
Monocytes Absolute: 0.3 10*3/uL (ref 0.1–1.0)
Monocytes Relative: 8 %
Neutro Abs: 2.8 10*3/uL (ref 1.7–7.7)
Neutrophils Relative %: 67 %
Platelets: 187 10*3/uL (ref 150–400)
RBC: 4.43 MIL/uL (ref 3.87–5.11)
RDW: 12.1 % (ref 11.5–15.5)
WBC: 4.1 10*3/uL (ref 4.0–10.5)
nRBC: 0 % (ref 0.0–0.2)

## 2022-04-18 LAB — COMPREHENSIVE METABOLIC PANEL
ALT: 19 U/L (ref 0–44)
AST: 19 U/L (ref 15–41)
Albumin: 3.7 g/dL (ref 3.5–5.0)
Alkaline Phosphatase: 74 U/L (ref 38–126)
Anion gap: 10 (ref 5–15)
BUN: 9 mg/dL (ref 8–23)
CO2: 21 mmol/L — ABNORMAL LOW (ref 22–32)
Calcium: 9.4 mg/dL (ref 8.9–10.3)
Chloride: 104 mmol/L (ref 98–111)
Creatinine, Ser: 0.8 mg/dL (ref 0.44–1.00)
GFR, Estimated: >60 mL/min (ref >60)
Glucose, Bld: 189 mg/dL — ABNORMAL HIGH (ref 70–99)
Potassium: 4.1 mmol/L (ref 3.5–5.1)
Sodium: 135 mmol/L (ref 135–145)
Total Bilirubin: 0.8 mg/dL (ref 0.3–1.2)
Total Protein: 6.5 g/dL (ref 6.5–8.1)

## 2022-04-18 IMAGING — MR MR HEAD W/O CM
6 of 10 series · 27 of 48 positions shown · non-contrast
Comparison: Head CT [DATE] and MRI [DATE]

CLINICAL DATA: Neuro deficit, acute, stroke suspected. Confusion.
Recent head injury.

EXAM:
MRI HEAD WITHOUT CONTRAST
TECHNIQUE: Multiplanar, multiecho pulse sequences of the brain and surrounding
structures were obtained without intravenous contrast.

[Series 2: DWI · axial · 3.0mm · 0.94mm/px · z∈[-61,+72]mm · 8 of 91 slices shown (1 of 2)]
[im 1/91]
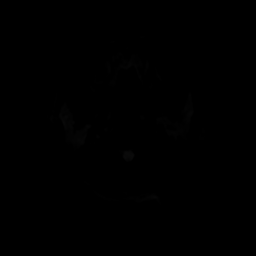
[im 11/91]
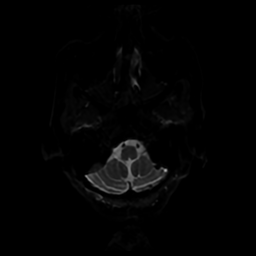
[im 31/91]
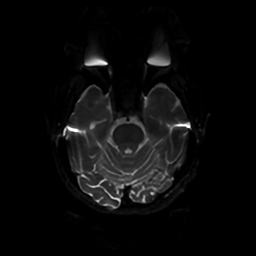
[im 41/91]
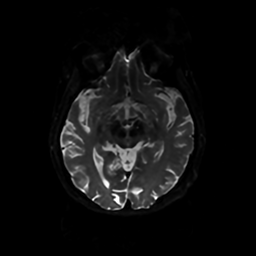
[im 51/91]
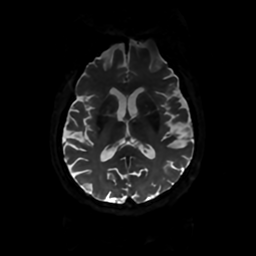
[im 61/91]
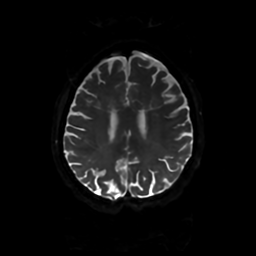
[im 81/91]
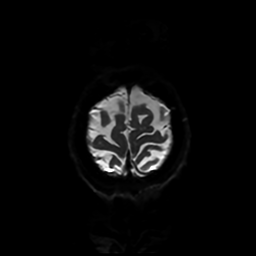
[im 91/91]
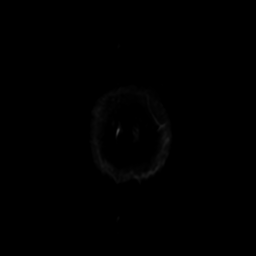

[Series 3: DWI · coronal · 4.0mm · 0.94mm/px · 7 of 70 slices shown (2 of 2)]
[im 1/70]
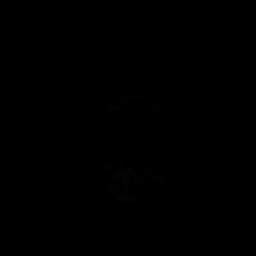
[im 12/70]
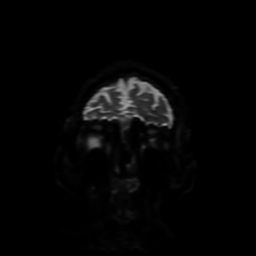
[im 24/70]
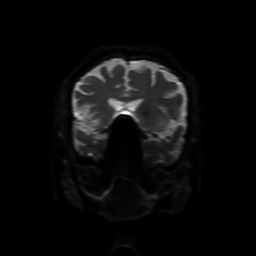
[im 35/70]
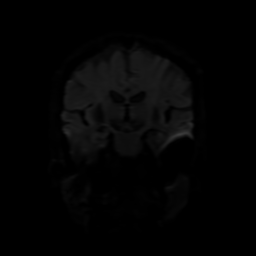
[im 47/70]
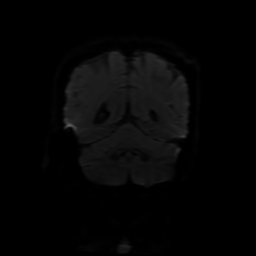
[im 58/70]
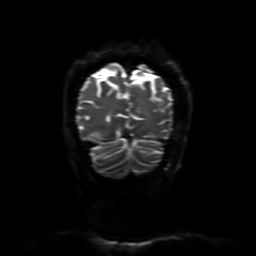
[im 70/70]
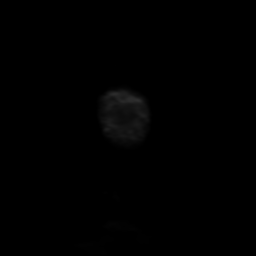

[Series 4: FLAIR · sagittal · 5.0mm · 0.23mm/px · 2 of 23 slices shown (1 of 2)]
[im 1/23]
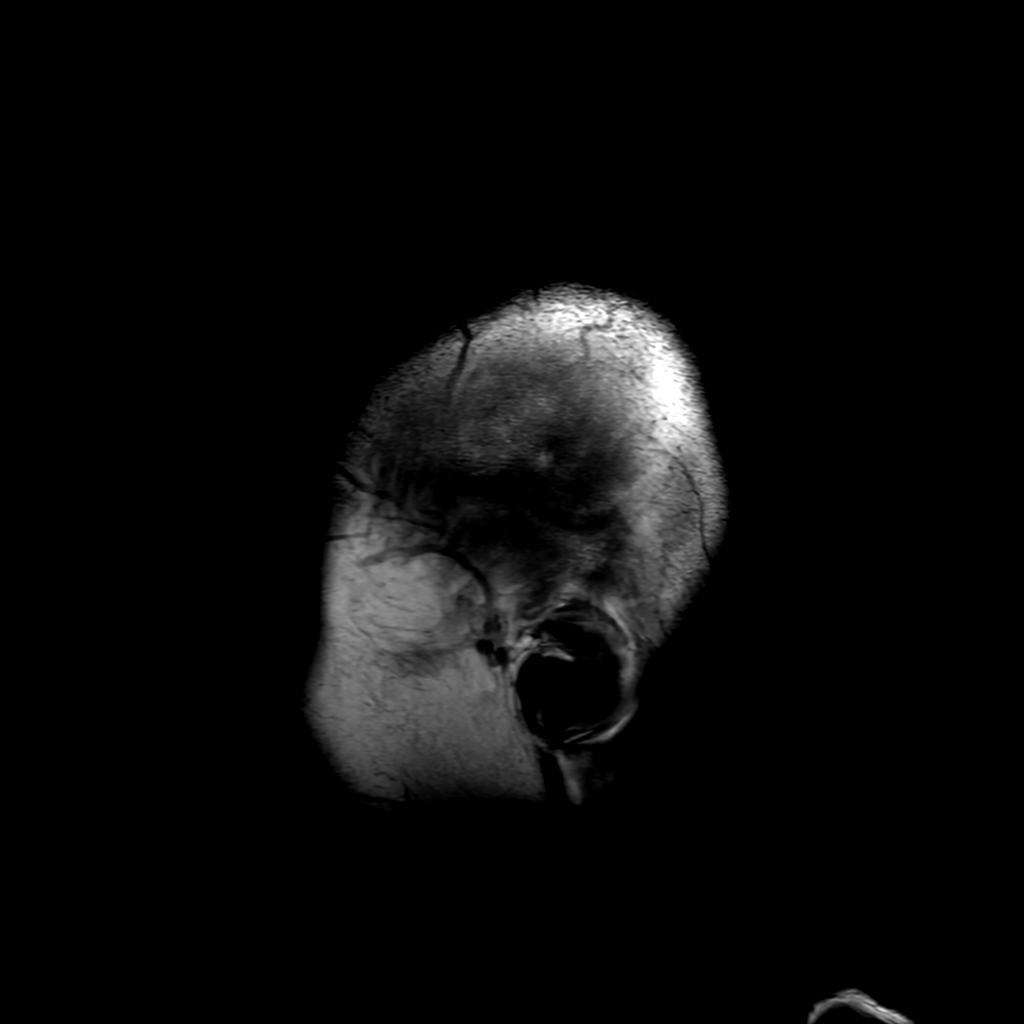
[im 23/23]
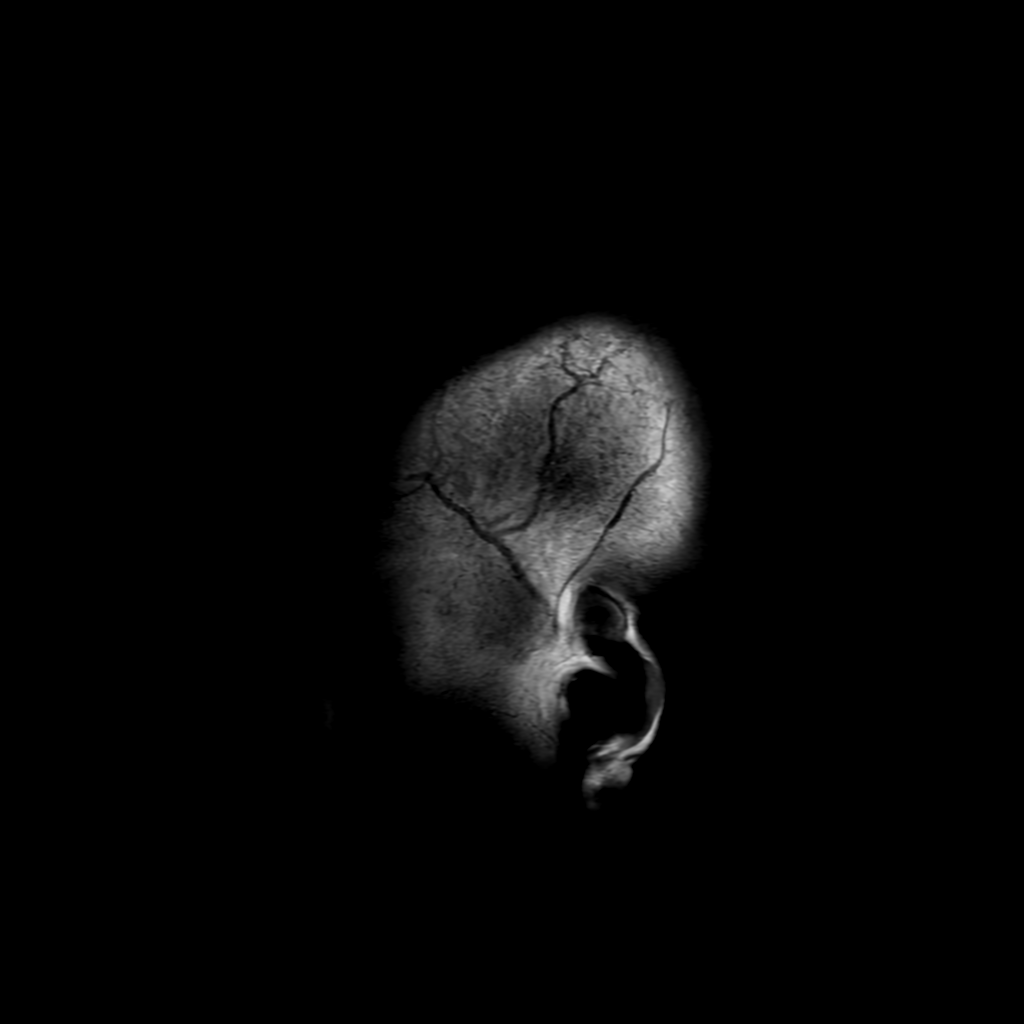

[Series 6: FLAIR · axial · 4.0mm · 0.45mm/px · z∈[-58,+87]mm · 3 of 34 slices shown (2 of 2)]
[im 1/34]
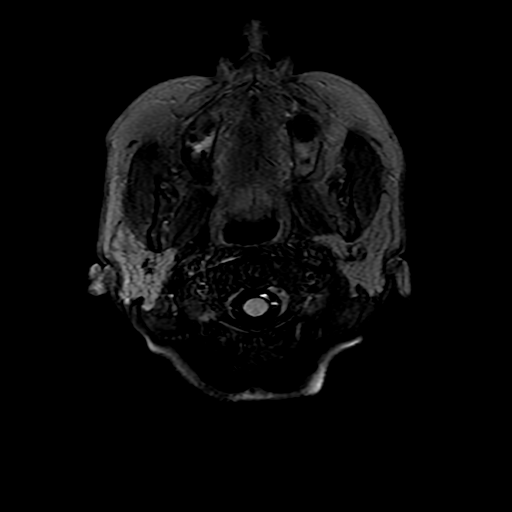
[im 17/34]
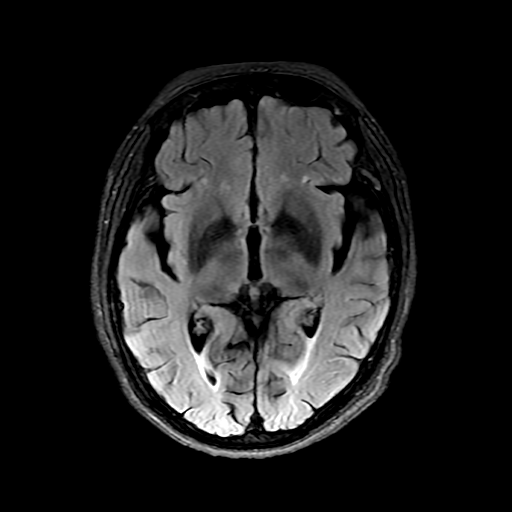
[im 34/34]
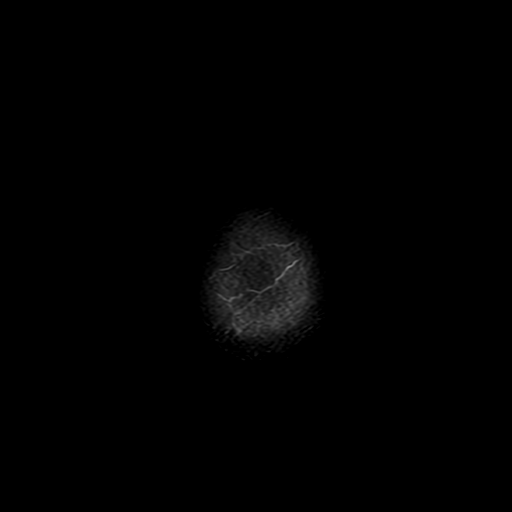

[Series 250: ADC · axial · 3.0mm · 0.94mm/px · z∈[-61,+72]mm · 4 of 46 slices shown (1 of 2)]
[im 1/46]
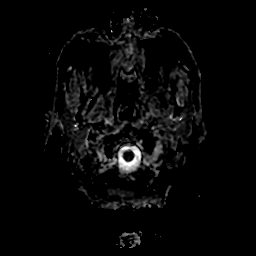
[im 16/46]
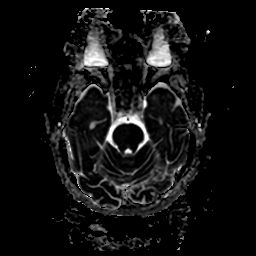
[im 31/46]
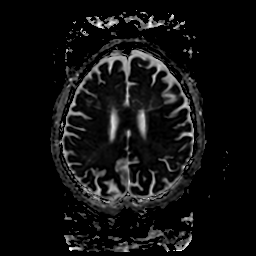
[im 46/46]
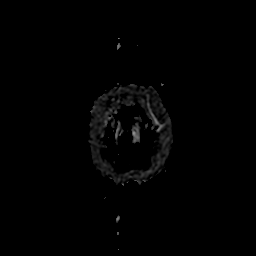

[Series 350: ADC · coronal · 4.0mm · 0.94mm/px · 3 of 35 slices shown (2 of 2)]
[im 1/35]
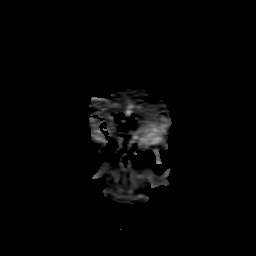
[im 18/35]
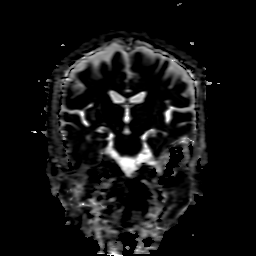
[im 35/35]
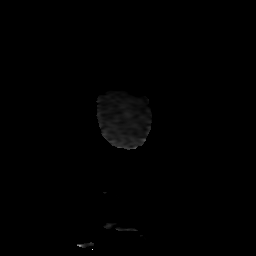

[27 of 48 positions shown; findings below may reference images not displayed]

FINDINGS: Brain: There is no evidence of an acute infarct, intracranial
hemorrhage, mass, midline shift, or extra-axial fluid collection.
Small T2 hyperintensities in the cerebral white matter, most notable
in the left frontal lobe, are unchanged from the recent prior MRI
and are nonspecific but compatible with mild chronic small vessel
ischemic disease. Mild cerebral atrophy is within normal limits for
age.

Vascular: Major intracranial vascular flow voids are preserved.

Skull and upper cervical spine: Unremarkable bone marrow signal.

Sinuses/Orbits: Bilateral cataract extraction. Paranasal sinuses and
mastoid air cells are clear.

Other: None.
IMPRESSION: 1. No acute intracranial abnormality.
2. Mild chronic small vessel ischemic disease.

## 2022-04-18 MED ORDER — CEPHALEXIN 500 MG PO CAPS: 500.0000 mg | ORAL_CAPSULE | Freq: Three times a day (TID) | ORAL | 0 refills | Status: AC

## 2022-04-18 NOTE — ED Notes (Signed)
Patient transported to MRI 

## 2022-04-18 NOTE — ED Provider Notes (Signed)
?[facility] DEPT ?Provider Note ? ? ?CSN: [id number] ?Arrival date & time: 04/17/22  2302 ? ?  ? ?History ? ?Chief Complaint  ?Patient presents with  ? Altered Mental Status  ? ? ?Haley Bartlett is a 71 y.o. female. ? ?Patient is a 71 year old female with past medical history of type 2 diabetes on insulin, depression.  Patient brought by daughters for evaluation of altered mental status.  According to the family, she developed difficulty speaking and became slow to respond earlier this morning.  This started at approximately 1030.  Patient was initially taken to [facility] where a head CT and laboratory studies were performed.  Daughters state that they waited in the waiting room for an extended period of time, then left and came here.  Patient denies additional symptoms such as weakness, numbness, or visual disturbances. ? ?I am also told that patient experienced dizziness and fall earlier this month.  She had an MRI performed on May 3 which was unremarkable. ? ?Also of note is that patient speaks minimal [place], mainly [place].  History was taken with the assistance of daughters present at bedside. ? ?The history is provided by the patient.  ? ?  ? ?Home Medications ?Prior to Admission medications   ?Medication Sig Start Date End Date Taking? Authorizing Provider  ?donepezil (ARICEPT) 10 MG tablet Take 1 tablet daily 03/16/22   Van Clines, MD  ?dorzolamide-timolol (COSOPT) 22.3-6.8 MG/ML ophthalmic solution Place 1 drop into both eyes 2 (two) times daily.    [provider]  ?gabapentin (NEURONTIN) 100 MG capsule Take 100 mg by mouth at bedtime. Taking 1 qhs    [provider]  ?glucose blood (ACCU-CHEK GUIDE) test strip 1 each by Other route 2 (two) times daily. And lancets 2/day 02/18/21   Romero Belling, MD  ?insulin degludec (TRESIBA FLEXTOUCH) 100 UNIT/ML FlexTouch Pen Inject 10 Units into the skin daily. 04/17/22   Carlus Pavlov, MD  ?insulin lispro  (HUMALOG KWIKPEN) 100 UNIT/ML KwikPen Inject 4-6 Units into the skin 3 (three) times daily before meals. And pen needles 1/day 04/17/22   Carlus Pavlov, MD  ?Insulin Pen Needle 32G X 4 MM MISC Use 4x a day 04/17/22   Carlus Pavlov, MD  ?latanoprost (XALATAN) 0.005 % ophthalmic solution  01/02/21   [provider]  ?levothyroxine (SYNTHROID, LEVOTHROID) 25 MCG tablet Take 25 mcg by mouth daily before breakfast.    [provider]  ?memantine (NAMENDA) 10 MG tablet Take 1 tablet every night for 2 weeks, then increase to 1 tablet twice a day 03/16/22   Van Clines, MD  ?sertraline (ZOLOFT) 25 MG tablet Take 25 mg by mouth daily. 1.5 tab daily    [provider]  ?   ? ?Allergies    ?Atorvastatin   ? ?Review of Systems   ?Review of Systems  ?All other systems reviewed and are negative. ? ?Physical Exam ?Updated Vital Signs ?BP 128/77   Pulse 84   Temp 98.6 ?F (37 ?C) (Oral)   Resp 18   SpO2 99%  ?Physical Exam ?Vitals and nursing note reviewed.  ?Constitutional:   ?   General: She is not in acute distress. ?   Appearance: She is well-developed. She is not diaphoretic.  ?HENT:  ?   Head: Normocephalic and atraumatic.  ?Cardiovascular:  ?   Rate and Rhythm: Normal rate and regular rhythm.  ?   Heart sounds: No murmur heard. ?  No friction rub. No gallop.  ?  Pulmonary:  ?   Effort: Pulmonary effort is normal. No respiratory distress.  ?   Breath sounds: Normal breath sounds. No wheezing.  ?Abdominal:  ?   General: Bowel sounds are normal. There is no distension.  ?   Palpations: Abdomen is soft.  ?   Tenderness: There is no abdominal tenderness.  ?Musculoskeletal:     ?   General: Normal range of motion.  ?   Cervical back: Normal range of motion and neck supple.  ?Skin: ?   General: Skin is warm and dry.  ?Neurological:  ?   General: No focal deficit present.  ?   Mental Status: She is alert and oriented to person, place, and time.  ?   Cranial Nerves: No cranial nerve deficit.  ?    Motor: No weakness.  ?   Coordination: Coordination normal.  ?   Comments: Patient moves all 4 extremities normally with normal strength.  The only focal deficit is when patient is being spoken to, it takes her many seconds to respond.  She appears confused initially when asked questions, then her responses come with a significant delay.  Daughter is reports that her speech sounds slurred compared to normal.  ? ? ?ED Results / Procedures / Treatments   ?Labs ?(all labs ordered are listed, but only abnormal results are displayed) ?Labs Reviewed  ?URINALYSIS, ROUTINE W REFLEX MICROSCOPIC  ? ? ?EKG ?None ? ?Radiology ?CT HEAD WO CONTRAST ? ?Result Date: 04/17/2022 ?CLINICAL DATA:  Altered mental status EXAM: CT HEAD WITHOUT CONTRAST TECHNIQUE: Contiguous axial images were obtained from the base of the skull through the vertex without intravenous contrast. RADIATION DOSE REDUCTION: This exam was performed according to the departmental dose-optimization program which includes automated exposure control, adjustment of the mA and/or kV according to patient size and/or use of iterative reconstruction technique. COMPARISON:  CT done on 09/29/2016 MR done on 04/08/2022 FINDINGS: Brain: No acute intracranial findings are seen. There are no epidural or subdural fluid collections. Calcifications are seen in the basal ganglia. Cortical sulci are prominent. Vascular: Scattered arterial calcifications are seen. Skull: Unremarkable. Sinuses/Orbits: There is mild mucosal thickening in the ethmoid sinus. Other: No significant changes are noted. IMPRESSION: No acute intracranial findings are seen in noncontrast CT brain. Atrophy. Electronically Signed   By: Ernie Avena M.D.   On: 04/17/2022 18:51   ? ?Procedures ?Procedures  ? ? ?Medications Ordered in ED ?Medications - No data to display ? ?ED Course/ Medical Decision Making/ A&P ? ?Patient brought by daughters for evaluation of difficulty speaking and slow responding.  This  came on acutely earlier this morning.  She had initially presented to [facility] where work-up was initiated including head CT and laboratory studies.  These were all unremarkable.  Due to prolonged wait time, patient then came here. ? ?Symptoms somewhat unusual, but the possibility of CVA is not out of the question.  I discussed care with Dr. Derry Lory who feels as though patient should come to [facility] for an MRI and neurology consultation.  Patient transferred back to [facility] for further work-up. ? ?Final Clinical Impression(s) / ED Diagnoses ?Final diagnoses:  ?None  ? ? ?Rx / DC Orders ?ED Discharge Orders   ? ? None  ? ?  ? ? ?  ?Geoffery Lyons, MD ?04/18/22 [phone] ? ?

## 2022-04-18 NOTE — ED Provider Notes (Signed)
Past Medical History:  ?Diagnosis Date  ? Depression   ?  ?Difficulty rememberinjg where she was going going to an appointment.   Slow speech.  3 days diarrhea  ? ?10AM, confused, not self, didn't remember  ?5PM speech problems, slow to respond ? ? ?Physical Exam  ?BP (!) 146/78   Pulse 86   Temp 98 ?F (36.7 ?C) (Oral)   Resp (!) 21   SpO2 100%  ? ?Physical Exam ? ?Procedures  ?Procedures ? ?ED Course / MDM  ?  ?Medical Decision Making ?Amount and/or Complexity of Data Reviewed ?Labs: ordered. ?Radiology: ordered. ? ?Risk ?Prescription drug management. ? ? ?71yo female with history of DM, alzheimer's, depression, presents with concern for altered mental status and speech changes from [facility] ED.  Initially presented to [facility], had CT, labs, then went to [facility] due to wait times, then was transferred back to [facility] for MRI.  ? ?Symptoms consistent at time of evaluation and concerning for aphasia.  Code Stroke not called yesterday given mild abnormalitiy, unclear if LNW was in A.  MRI pending.  Discussed with Neurology, will consult if signs of stroke on MRI. Duration of symptoms not consistent with TIA. ? ?MRI negative for CVA>  No sign of anemia, significant electrolyte abnormality. UA with 6-10WBC.  Will treat for possible UTI, recommend PCP and Neurology follow up.  ? ? ? ? ? ? ?  ?Alvira Monday, MD ?04/18/22 2211 ? ?

## 2022-05-07 DIAGNOSIS — E1165 Type 2 diabetes mellitus with hyperglycemia: Secondary | ICD-10-CM | POA: Diagnosis not present

## 2022-05-13 ENCOUNTER — Encounter: Payer: Self-pay | Admitting: Endocrinology

## 2022-05-21 DIAGNOSIS — J029 Acute pharyngitis, unspecified: Secondary | ICD-10-CM | POA: Diagnosis not present

## 2022-05-21 DIAGNOSIS — H6692 Otitis media, unspecified, left ear: Secondary | ICD-10-CM | POA: Diagnosis not present

## 2022-05-27 DIAGNOSIS — E1165 Type 2 diabetes mellitus with hyperglycemia: Secondary | ICD-10-CM | POA: Diagnosis not present

## 2022-06-26 ENCOUNTER — Ambulatory Visit: Payer: Medicare Other | Admitting: Internal Medicine

## 2022-06-26 DIAGNOSIS — E1165 Type 2 diabetes mellitus with hyperglycemia: Secondary | ICD-10-CM | POA: Diagnosis not present

## 2022-06-30 DIAGNOSIS — H40053 Ocular hypertension, bilateral: Secondary | ICD-10-CM | POA: Diagnosis not present

## 2022-07-22 ENCOUNTER — Ambulatory Visit: Payer: Medicare Other | Admitting: Physician Assistant

## 2022-07-27 DIAGNOSIS — E1165 Type 2 diabetes mellitus with hyperglycemia: Secondary | ICD-10-CM | POA: Diagnosis not present

## 2022-07-28 ENCOUNTER — Telehealth: Payer: Self-pay

## 2022-07-28 DIAGNOSIS — R42 Dizziness and giddiness: Secondary | ICD-10-CM | POA: Diagnosis not present

## 2022-07-28 DIAGNOSIS — E1165 Type 2 diabetes mellitus with hyperglycemia: Secondary | ICD-10-CM | POA: Diagnosis not present

## 2022-07-28 DIAGNOSIS — E538 Deficiency of other specified B group vitamins: Secondary | ICD-10-CM | POA: Diagnosis not present

## 2022-07-28 DIAGNOSIS — H401131 Primary open-angle glaucoma, bilateral, mild stage: Secondary | ICD-10-CM | POA: Diagnosis not present

## 2022-07-28 DIAGNOSIS — R413 Other amnesia: Secondary | ICD-10-CM | POA: Diagnosis not present

## 2022-07-28 NOTE — Telephone Encounter (Signed)
Provider's office called requesting pt instructions from last office visit as pt had not started recommended regimen: - I suggested to:  Patient Instructions  Please stop: - Prandin - Actos  - Farxiga   Continue: - Metformin 500 mg 2x a day   Please start: - Tresiba 10 units daily and increase by 2 units every 2 days until the sugars in am are <150 - Humalog 4-5 units 15 min before each meal and increase the dose as needed    Continue Levothyroxine 25 mcg daily.   Take the thyroid hormone every day, with water, at least 30 minutes before breakfast, separated by at least 4 hours from: - acid reflux medications - calcium - iron - multivitamins   Please return in 2 months with your sugar log.  Pt to be advised of recommendation again and put on wait list to f/u sooner.

## 2022-08-20 ENCOUNTER — Ambulatory Visit: Payer: Medicare Other | Admitting: Internal Medicine

## 2022-08-20 ENCOUNTER — Encounter: Payer: Self-pay | Admitting: Internal Medicine

## 2022-08-20 VITALS — BP 110/62 | HR 76 | Ht 59.0 in | Wt 107.4 lb

## 2022-08-20 DIAGNOSIS — Z794 Long term (current) use of insulin: Secondary | ICD-10-CM

## 2022-08-20 DIAGNOSIS — E785 Hyperlipidemia, unspecified: Secondary | ICD-10-CM | POA: Diagnosis not present

## 2022-08-20 DIAGNOSIS — E039 Hypothyroidism, unspecified: Secondary | ICD-10-CM | POA: Insufficient documentation

## 2022-08-20 DIAGNOSIS — E11319 Type 2 diabetes mellitus with unspecified diabetic retinopathy without macular edema: Secondary | ICD-10-CM | POA: Diagnosis not present

## 2022-08-20 NOTE — Progress Notes (Signed)
Patient ID: Haley Bartlett, female   DOB: Nov 02, 1951, 71 y.o.   MRN: [id number]  HPI: Haley Bartlett is a 71 y.o.-year-old female, returning for follow-up for DM2, dx in 1993, on insulin since 2002, uncontrolled, with complications (DR).  She previously saw Dr. Loanne Bartlett, last visit 4 months ago. She is accompanied by her husband who translates for [place].   Reviewed HbA1c: 07/30/2022: HbA1c 7.7% Lab Results  Component Value Date   HGBA1C 8.6 (A) 04/17/2022   HGBA1C 8.5 (A) 01/08/2022   HGBA1C 8.2 (A) 09/30/2021   HGBA1C 7.8 (A) 08/26/2021   HGBA1C 6.9 (A) 04/22/2021   HGBA1C 6.9 (A) 02/18/2021  03/26/2022: HbA1c 9.0%, C-peptide 1.6, glucose 217 08/13/2010: GAD antibody <1, Glu 164, C peptide 1.1  At last visit she was on: - Metformin 500 mg 2x a day - Prandin 1 mg 3 times a day before meals - Actos 30 mg before breakfast - Farxiga 10 mg before breakfast - Humalog 11 units before the 3 meals  - only if sugars >300 She was taken off insulin but restarted 01/2022. Januvia and Rybelsus were too expensive.  I recommended to change to: - Metformin 500 mg 2x a day >> not taken since last OV 2/2 UTIs - Tresiba 10 >> she decreased to 6 units daily 2/2 lows - Humalog 4-5  units 15 min before each meal and increase the dose as needed >> 6 units after dinner  Pt checks her sugars >4x a day and they are - pt forgot the receiver: - am: 110-140 - 2h after b'fast: 300s - lunch: 120-150 - 2h after lunch: ? - dinner: 100s - 2h after dinner: up to 300 - bedtime: ?  Previously:   Lowest sugar was 120 >> 67; she has hypoglycemia awareness at 70.  Highest sugar was HI >> 400.  Glucometer: Accu-Chek guide  - last BUN/creatinine:  Test Value / Unit Interpretation Reference Range  Comp. Metabolic Panel (69)[[id number]]     Collected: 03/26/2022 04:20 PM     Specimen Received: 03/26/2022 05:00 AM       Glucose [001032] 217 mg/dL H 70-99 mg/dL  BUN [001040] 18 mg/dL Normal 8-27 mg/dL   Creatinine [001370] 1.03 mg/dL H 0.57-1.00 mg/dL  eGFR [100779] 59 mL/min/1.73 L >59 mL/min/1.73  BUN/Creatinine Ratio [011577] 17 Normal 12-28  Sodium [001198] 135 mmol/L Normal 134-144 mmol/L  Potassium [001180] 4.5 mmol/L Normal 3.5-5.2 mmol/L  Chloride [001206] 99 mmol/L Normal 96-106 mmol/L  Carbon Dioxide, Total [001578] 23 mmol/L Normal 20-29 mmol/L  Calcium [001016] 9.9 mg/dL Normal 8.7-10.3 mg/dL  Protein, Total [001073] 6.9 g/dL Normal 6.0-8.5 g/dL  Albumin [001081] 4.4 g/dL Normal 3.8-4.8 g/dL  Globulin, Total [012039] 2.5 g/dL Normal 1.5-4.5 g/dL  A/G Ratio [012047] 1.8 Normal 1.2-2.2  Bilirubin, Total [001099] 0.3 mg/dL Normal 0.0-1.2 mg/dL  Alkaline Phosphatase [001107] 88 IU/L Normal 44-121 IU/L  AST (SGOT) [001123] 22 IU/L Normal 0-40 IU/L  ALT (SGPT) [001545] 19 IU/L Normal 0-32 IU/L   Albumin/Creatinine Ratio,Urine[140285]     Collected: 03/26/2022 04:20 PM     Specimen Received: 03/26/2022 05:00 AM       Creatinine, Urine [013672] 54.3 mg/dL Normal Not Estab. mg/dL  Albumin, Urine [140097] 6.3 ug/mL Normal Not Estab. ug/mL  Alb/Creat Ratio [140291] 12 mg/g creat Normal 0-29 mg/g creat  Normal: 0 - 29 Moderately increased: 30 - 300 Severely increased: >300   Lab Results  Component Value Date   BUN 9 04/18/2022   BUN 12 04/17/2022   CREATININE  0.80 04/18/2022   CREATININE 0.80 04/17/2022  She is not on ACE inhibitor/ARB.  -+ HL; last set of lipids: Lipid Panel[[id number]]     Collected: 03/26/2022 04:20 PM     Specimen Received: 03/26/2022 05:00 AM       Cholesterol, Total [001065] 247 mg/dL H 100-199 mg/dL  Triglycerides [001172] 85 mg/dL Normal 0-149 mg/dL  HDL Cholesterol [011817] 104 mg/dL Normal >39 mg/dL  VLDL Cholesterol Cal [011919] 14 mg/dL Normal 5-40 mg/dL  LDL Chol Calc (NIH) [012059] 129 mg/dL H 0-99 mg/dL  No results found for: "CHOL", "HDL", "LDLCALC", "LDLDIRECT", "TRIG", "CHOLHDL" She is intolerant to statins.  - last eye exam was in 02/2022: +  DR.   - + numbness and tingling in her feet.  Last foot exam 09/2021.  She is on Neurontin 100 mg at bedtime.  She also has hypothyroidism - on levothyroxine 25 mcg daily, taken: - in am - fasting - at least 1h from b'fast - no calcium - no iron - + multivitamins at lunchtime - no PPIs - not on Biotin  TSH levels:  2022-03-17     TSH 2.00   0.34-4.50   No results found for: "TSH"  ROS: + See HPI No increased urination, blurry vision, nausea, chest pain.  Past Medical History:  Diagnosis Date   Depression    Past Surgical History:  Procedure Laterality Date   ABDOMINAL HYSTERECTOMY     APPENDECTOMY     THYROIDECTOMY     Social History   Socioeconomic History   Marital status: Married    Spouse name: Haley Bartlett   Number of children: 2   Years of education: 12   Highest education level: Not on file  Occupational History   Occupation: retired  Tobacco Use   Smoking status: Never   Smokeless tobacco: Never  Vaping Use   Vaping Use: Never used  Substance and Sexual Activity   Alcohol use: No   Drug use: No   Sexual activity: Not on file  Other Topics Concern   Not on file  Social History Narrative   Lives with Haley Bartlett, husband   Caffeine use: Coffee daily   Right handed    Social Determinants of Health   Financial Resource Strain: Not on file  Food Insecurity: Not on file  Transportation Needs: Not on file  Physical Activity: Not on file  Stress: Not on file  Social Connections: Not on file  Intimate Partner Violence: Not on file   Current Outpatient Medications on File Prior to Visit  Medication Sig Dispense Refill   donepezil (ARICEPT) 10 MG tablet Take 1 tablet daily 30 tablet 11   dorzolamide-timolol (COSOPT) 22.3-6.8 MG/ML ophthalmic solution Place 1 drop into both eyes 2 (two) times daily.     gabapentin (NEURONTIN) 100 MG capsule Take 100 mg by mouth at bedtime. Taking 1 qhs     glucose blood (ACCU-CHEK GUIDE) test strip 1 each by Other route 2  (two) times daily. And lancets 2/day 200 each 3   insulin degludec (TRESIBA FLEXTOUCH) 100 UNIT/ML FlexTouch Pen Inject 10 Units into the skin daily. 9 mL 3   insulin lispro (HUMALOG KWIKPEN) 100 UNIT/ML KwikPen Inject 4-6 Units into the skin 3 (three) times daily before meals. And pen needles 1/day 30 mL 3   Insulin Pen Needle 32G X 4 MM MISC Use 4x a day 300 each 3   latanoprost (XALATAN) 0.005 % ophthalmic solution      levothyroxine (SYNTHROID, LEVOTHROID) 25 MCG tablet  Take 25 mcg by mouth daily before breakfast.     memantine (NAMENDA) 10 MG tablet Take 1 tablet every night for 2 weeks, then increase to 1 tablet twice a day 60 tablet 11   sertraline (ZOLOFT) 25 MG tablet Take 25 mg by mouth daily. 1.5 tab daily     No current facility-administered medications on file prior to visit.   Allergies  Allergen Reactions   Atorvastatin Other (See Comments)   Family History  Problem Relation Age of Onset   Dementia Sister    Diabetes Sister    Diabetes Mother    Diabetes Brother    PE: There were no vitals taken for this visit. Wt Readings from Last 3 Encounters:  04/17/22 93 lb (42.2 kg)  04/17/22 95 lb (43.1 kg)  03/26/22 94 lb (42.6 kg)   Constitutional: underweight, in NAD Eyes: EOMI, no exophthalmos ENT: no thyromegaly, no cervical lymphadenopathy Cardiovascular: RRR, No MRG Respiratory: CTA B Musculoskeletal: no deformities, strength intact in all 4 Skin: warm, no rashes Neurological: no tremor with outstretched hands  ASSESSMENT: 1. DM2, insulin-dependent, uncontrolled, with complications - DR  2. HL  3.  Hypothyroidism  PLAN:  1. Patient with longstanding, uncontrolled, diabetes, on oral antidiabetic regimen at last visit, which was insufficient.  Her HbA1c was slightly lower, at 8.6%, however, reviewing her CGM tracings, sugars are high throughout the day, improving overnight, but not to the target range.  They were increasing significantly after every meal.   Therefore, I suggested to stop repaglinide, Actos, and Farxiga at that time.  I advised her to continue metformin and we also started the basal-bolus insulin regimen.  My suspicion was that she was insulin deficient.  We could not check for this at last visit due to the uncontrolled diabetes.  At last visit I demonstrated to the husband and the patient about how to inject long-acting and short-acting insulin and how to titrate the doses based on the blood sugars.  We gave her a pen of Tresiba to start using that at night.  I advised her to take the Humalog before every meal, even if the sugars were normal before the meal.  -However, patient's PCP contacted [place] last month about the diabetes plan, since the patient did not start any of the above recommendations... We communicated the above changes again to the patient and gave them an appointment.  Per message from PCP, patient is adamant about getting the injections herself and does not allow her husband to help her.  She does have memory loss/Alzheimer's, which is a large impediment in her diabetes control.  However, at this visit, patient mentions that she is getting [place] in a.m. and Humalog once a day at the start of dinner (or, per husband, after dinner). -She had another HbA1c 07/30/2022 in PCPs office: This was lower, at 7.7% -She forgot her CGM receiver at home so we could not download her CGM.  Per her recall, sugars are at goal before meals but they are increasing significantly after she eats.  Upon questioning, she is not taking Humalog before every meal, she only takes it at night but not 15 minutes before the meal.  We discussed that taking the Humalog after the meal can cause hypoglycemia overnight.  Indeed, she did have episodes of low blood sugars down to the 60s.  She also had to back off the dose of [place] due to low blood sugars.  She is currently using 6 units, which we will continue.  However, more Humalog, I advised her to take this before  every meal and and to vary the dose based on the size and consistency of the meal. -She stopped metformin due to UTI's.  I advised her that this is not a side effect of metformin, but she is adamant about this so for now I would not recommend restarting it.  We reviewed the rest of her medicines and I explained that she was the most likely possible culprit for this.  For now we will continue off her oral medicines and only on insulin. -At today's visit, we will check her for insulin deficiency - I suggested to:  Patient Instructions  Please continue: - Tresiba 6 units daily  Please use: - Humalog 4-6 units 15 min before EACH MEAL  Continue Levothyroxine 25 mcg daily.  Take the thyroid hormone every day, with water, at least 30 minutes before breakfast, separated by at least 4 hours from: - acid reflux medications - calcium - iron - multivitamins  Please return in 3-4 months.  - advised to check sugars at different times of the day - 4x a day, rotating check times - advised for yearly eye exams >> she is UTD - return to clinic in 3-4 months  2. HL -Reviewed the latest lipid panel from 03/2022: LDL above our target, at 129, the rest of the fractions at goal -She is intolerant to statins -better diabetes control should also help her lipid panel  3.  Hypothyroidism - latest thyroid labs reviewed with pt. >> normal 03/2022 - see HPI No results found for: "TSH" - she continues on LT4 25 mcg daily - pt feels good on this dose. - we discussed about taking the thyroid hormone every day, with water, >30 minutes before breakfast, separated by >4 hours from acid reflux medications, calcium, iron, multivitamins. Pt. is taking it correctly.  Philemon Kingdom, MD PhD [facility] Endocrin[facility]

## 2022-08-20 NOTE — Patient Instructions (Addendum)
Please continue: - Tresiba 6 units daily  Please use: - Humalog 4-6 units 15 min before EACH MEAL  Continue Levothyroxine 25 mcg daily.  Take the thyroid hormone every day, with water, at least 30 minutes before breakfast, separated by at least 4 hours from: - acid reflux medications - calcium - iron - multivitamins  Please return in 3-4 months.

## 2022-08-25 LAB — ANTI-ISLET CELL ANTIBODY: Islet Cell Ab: NEGATIVE

## 2022-08-26 LAB — C-PEPTIDE: C-Peptide: 1.04 ng/mL (ref 0.80–3.85)

## 2022-08-26 LAB — GLUCOSE, FASTING: Glucose, Bld: 270 mg/dL — ABNORMAL HIGH (ref 65–99)

## 2022-08-26 LAB — GLUTAMIC ACID DECARBOXYLASE AUTO ABS: Glutamic Acid Decarb Ab: <5 IU/mL (ref <5)

## 2022-08-26 LAB — ZNT8 ANTIBODIES: ZNT8 Antibodies: <10 U/mL (ref <15)

## 2022-08-27 DIAGNOSIS — E1165 Type 2 diabetes mellitus with hyperglycemia: Secondary | ICD-10-CM | POA: Diagnosis not present

## 2022-08-29 DIAGNOSIS — M9903 Segmental and somatic dysfunction of lumbar region: Secondary | ICD-10-CM | POA: Diagnosis not present

## 2022-08-29 DIAGNOSIS — M9902 Segmental and somatic dysfunction of thoracic region: Secondary | ICD-10-CM | POA: Diagnosis not present

## 2022-08-29 DIAGNOSIS — M5137 Other intervertebral disc degeneration, lumbosacral region: Secondary | ICD-10-CM | POA: Diagnosis not present

## 2022-08-29 DIAGNOSIS — M9905 Segmental and somatic dysfunction of pelvic region: Secondary | ICD-10-CM | POA: Diagnosis not present

## 2022-09-15 ENCOUNTER — Other Ambulatory Visit: Payer: Self-pay

## 2022-09-15 MED ORDER — INSULIN PEN NEEDLE 32G X 4 MM MISC
3 refills | Status: AC
Start: 2022-09-15 — End: ?

## 2022-09-24 ENCOUNTER — Other Ambulatory Visit: Payer: Self-pay

## 2022-09-24 ENCOUNTER — Emergency Department (HOSPITAL_COMMUNITY)
Admission: EM | Admit: 2022-09-24 | Discharge: 2022-09-25 | Disposition: A | Discharge status: Home / Self Care | Payer: Medicare Other | Attending: Emergency Medicine | Admitting: Emergency Medicine

## 2022-09-24 ENCOUNTER — Emergency Department (HOSPITAL_COMMUNITY): Payer: Medicare Other

## 2022-09-24 DIAGNOSIS — Z794 Long term (current) use of insulin: Secondary | ICD-10-CM | POA: Insufficient documentation

## 2022-09-24 DIAGNOSIS — Z7984 Long term (current) use of oral hypoglycemic drugs: Secondary | ICD-10-CM | POA: Diagnosis not present

## 2022-09-24 DIAGNOSIS — R519 Headache, unspecified: Secondary | ICD-10-CM | POA: Diagnosis not present

## 2022-09-24 DIAGNOSIS — E11649 Type 2 diabetes mellitus with hypoglycemia without coma: Secondary | ICD-10-CM | POA: Diagnosis not present

## 2022-09-24 DIAGNOSIS — E161 Other hypoglycemia: Secondary | ICD-10-CM | POA: Diagnosis not present

## 2022-09-24 DIAGNOSIS — Z1152 Encounter for screening for COVID-19: Secondary | ICD-10-CM | POA: Insufficient documentation

## 2022-09-24 DIAGNOSIS — R404 Transient alteration of awareness: Secondary | ICD-10-CM | POA: Diagnosis not present

## 2022-09-24 DIAGNOSIS — Z743 Need for continuous supervision: Secondary | ICD-10-CM | POA: Diagnosis not present

## 2022-09-24 DIAGNOSIS — E162 Hypoglycemia, unspecified: Secondary | ICD-10-CM

## 2022-09-24 DIAGNOSIS — R739 Hyperglycemia, unspecified: Secondary | ICD-10-CM | POA: Diagnosis not present

## 2022-09-24 DIAGNOSIS — R9431 Abnormal electrocardiogram [ECG] [EKG]: Secondary | ICD-10-CM | POA: Diagnosis not present

## 2022-09-24 DIAGNOSIS — T68XXXA Hypothermia, initial encounter: Secondary | ICD-10-CM

## 2022-09-24 LAB — SARS CORONAVIRUS 2 BY RT PCR: SARS Coronavirus 2 by RT PCR: NEGATIVE

## 2022-09-24 LAB — CBG MONITORING, ED
Glucose-Capillary: 54 mg/dL — ABNORMAL LOW (ref 70–99)
Glucose-Capillary: 97 mg/dL (ref 70–99)
Glucose-Capillary: 105 mg/dL — ABNORMAL HIGH (ref 70–99)
Glucose-Capillary: 109 mg/dL — ABNORMAL HIGH (ref 70–99)

## 2022-09-24 LAB — COMPREHENSIVE METABOLIC PANEL
ALT: 29 U/L (ref 0–44)
AST: 28 U/L (ref 15–41)
Albumin: 3.9 g/dL (ref 3.5–5.0)
Alkaline Phosphatase: 98 U/L (ref 38–126)
Anion gap: 9 (ref 5–15)
BUN: 12 mg/dL (ref 8–23)
CO2: 24 mmol/L (ref 22–32)
Calcium: 9.3 mg/dL (ref 8.9–10.3)
Chloride: 103 mmol/L (ref 98–111)
Creatinine, Ser: 0.83 mg/dL (ref 0.44–1.00)
GFR, Estimated: >60 mL/min (ref >60)
Glucose, Bld: 104 mg/dL — ABNORMAL HIGH (ref 70–99)
Potassium: 3.9 mmol/L (ref 3.5–5.1)
Sodium: 136 mmol/L (ref 135–145)
Total Bilirubin: 0.4 mg/dL (ref 0.3–1.2)
Total Protein: 7 g/dL (ref 6.5–8.1)

## 2022-09-24 LAB — CBC
HCT: 42.6 % (ref 36.0–46.0)
Hemoglobin: 14.3 g/dL (ref 12.0–15.0)
MCH: 31.6 pg (ref 26.0–34.0)
MCHC: 33.6 g/dL (ref 30.0–36.0)
MCV: 94 fL (ref 80.0–100.0)
Platelets: 218 10*3/uL (ref 150–400)
RBC: 4.53 MIL/uL (ref 3.87–5.11)
RDW: 12.1 % (ref 11.5–15.5)
WBC: 10.1 10*3/uL (ref 4.0–10.5)
nRBC: 0 % (ref 0.0–0.2)

## 2022-09-24 LAB — TSH: TSH: 1.615 u[IU]/mL (ref 0.350–4.500)

## 2022-09-24 LAB — LACTIC ACID, PLASMA: Lactic Acid, Venous: 3.1 mmol/L (ref 0.5–1.9)

## 2022-09-24 LAB — LIPASE, BLOOD: Lipase: 43 U/L (ref 11–51)

## 2022-09-24 MED ORDER — KETOROLAC TROMETHAMINE 30 MG/ML IJ SOLN
30.0000 mg | Freq: Once | INTRAMUSCULAR | Status: AC
  Administered 2022-09-24: 30 mg via INTRAVENOUS
  Filled 2022-09-24: qty 1

## 2022-09-24 MED ORDER — SODIUM CHLORIDE 0.9 % IV BOLUS
1000.0000 mL | Freq: Once | INTRAVENOUS | Status: AC
  Administered 2022-09-24: 1000 mL via INTRAVENOUS

## 2022-09-24 MED ORDER — SODIUM CHLORIDE 0.9 % IV BOLUS
1000.0000 mL | Freq: Once | INTRAVENOUS | Status: AC
  Administered 2022-09-25: 1000 mL via INTRAVENOUS

## 2022-09-24 MED ORDER — SODIUM CHLORIDE 0.9% FLUSH
3.0000 mL | Freq: Two times a day (BID) | INTRAVENOUS | Status: DC
  Administered 2022-09-24: 3 mL via INTRAVENOUS

## 2022-09-24 MED ORDER — SODIUM CHLORIDE 0.9% FLUSH
3.0000 mL | INTRAVENOUS | Status: DC | PRN
Start: 2022-09-24 — End: 2022-09-25
  Administered 2022-09-24: 3 mL via INTRAVENOUS

## 2022-09-24 MED ORDER — SODIUM CHLORIDE 0.9 % IV SOLN
250.0000 mL | INTRAVENOUS | Status: DC | PRN
Start: 2022-09-24 — End: 2022-09-25

## 2022-09-24 NOTE — ED Provider Notes (Signed)
Received patient in turnover from Dr. Langston Masker.  Please see their note for further details of Hx, PE.  Briefly patient is a 71 y.o. female with a Hypoglycemia .  Current plan is to obtain second lactate, UA.  Reassess.  UA independently turbid by me without infection.  Second lactate is resolved.  Patient continues to feel reasonably well.  Discharge home.    Deno Etienne, DO 09/25/22 [phone]

## 2022-09-24 NOTE — ED Triage Notes (Signed)
Pt BIBA from home. Pt found unresponsive by family. CBG on EMS arrival was 26. Given 25 gm D10. CBG then 334, but back down to 52.  Pt's family performed CPR. Pt has no c/o pain.  Given oral glucose and orange juice.  Aox4 upon arrival to ED  20 RAC

## 2022-09-24 NOTE — ED Provider Notes (Signed)
[facility] Provider Note   CSN: [id number] Arrival date & time: 09/24/22  2046     History  Chief Complaint  Patient presents with   Hypoglycemia    Haley Bartlett is a 71 y.o. female with a history of type 2 diabetes on insulin, early Alzheimer's on Aricept, hypothyroidism, presenting to ED with hypoglycemia.  The patient's daughter reports that she came home and found the patient lying on the floor unresponsive.  The patient's husband reports that she had gone on a walk with the patient in the park earlier today, the patient felt that her sugars were dropping, she tried to eat some chocolate.  However the alarm on her Dexcom monitor was not working appropriately she did not hear the beeping.  He says that she was cooking a meal in the kitchen and he found her lying on the floor afterwards.  He says the air conditioning was on and the floor was cold and he is not certain how long she was lying there.  Patient's daughter reports when she found the patient she called 911 and initiated CPR briefly.    Subsequently EMS arrived to give the patient's glucose, noting that she was hypoglycemic in the field, and the patient mentation quickly improved.  The patient was able to report that she did eat breakfast but she did not eat lunch.  Now she reports she is cramping pain in both of her legs, often gets "charley horses" in her legs".  She also reports a mild headache.  Patient's dexcom showed BS dropping as low as 26 this afternoon, suddenly.  The patient takes [place] 10 to 12 units in the morning, and Humulin 10 to 12 units before dinnertime.  He typically maintains tight control of her blood sugars.  Her PCP office note from September notes that there were some episodes of hypoglycemia overnight, and he had recommended Tresiba 6 units.  However the patient reports that she did titrate up because her sugars are running too high at home.  She is  comfortable at 10 units.  Her Dexcom monitor shows that her sugars are relatively well controlled on this regimen.  HPI     Home Medications Prior to Admission medications   Medication Sig Start Date End Date Taking? Authorizing Provider  donepezil (ARICEPT) 10 MG tablet Take 1 tablet daily Patient taking differently: Take 10 mg by mouth daily. 03/16/22  Yes Van Clines, MD  dorzolamide-timolol (COSOPT) 22.3-6.8 MG/ML ophthalmic solution Place 1 drop into both eyes 2 (two) times daily.   Yes [provider]  gabapentin (NEURONTIN) 100 MG capsule Take 100 mg by mouth at bedtime.   Yes [provider]  HUMALOG MIX 75/25 (75-25) 100 UNIT/ML SUSP injection Inject 10-12 Units into the skin daily before supper.   Yes [provider]  insulin degludec (TRESIBA FLEXTOUCH) 100 UNIT/ML FlexTouch Pen Inject 10 Units into the skin daily. Patient taking differently: Inject 10-12 Units into the skin daily before breakfast. 04/17/22  Yes Carlus Pavlov, MD  latanoprost (XALATAN) 0.005 % ophthalmic solution Place 1 drop into both eyes at bedtime.   Yes [provider]  levothyroxine (SYNTHROID, LEVOTHROID) 25 MCG tablet Take 25 mcg by mouth daily before breakfast.   Yes [provider]  memantine (NAMENDA) 10 MG tablet Take 1 tablet every night for 2 weeks, then increase to 1 tablet twice a day Patient taking differently: Take 10 mg by mouth in the morning and at bedtime. 03/16/22  Yes Cameron Sprang, MD  sertraline (ZOLOFT) 50 MG tablet Take 50 mg by mouth in the morning and at bedtime. 08/27/22  Yes [provider]  traZODone (DESYREL) 100 MG tablet Take 100 mg by mouth at bedtime.   Yes [provider]  glucose blood (ACCU-CHEK GUIDE) test strip 1 each by Other route 2 (two) times daily. And lancets 2/day 02/18/21   Renato Shin, MD  insulin lispro (HUMALOG KWIKPEN) 100 UNIT/ML KwikPen Inject 4-6 Units into the skin 3 (three) times daily  before meals. And pen needles 1/day Patient not taking: Reported on 09/24/2022 04/17/22   Philemon Kingdom, MD  Insulin Pen Needle 32G X 4 MM MISC Use 4x a day 09/15/22   Philemon Kingdom, MD      Allergies    Atorvastatin and Other    Review of Systems   Review of Systems  Physical Exam Updated Vital Signs BP (!) 99/53   Pulse 84   Temp (!) 96.1 F (35.6 C) (Rectal)   Resp (!) 25   Ht 4\' 11"  (1.499 m)   Wt 49.9 kg   SpO2 96%   BMI 22.22 kg/m  Physical Exam Constitutional:      General: She is not in acute distress. HENT:     Head: Normocephalic and atraumatic.  Eyes:     Conjunctiva/sclera: Conjunctivae normal.     Pupils: Pupils are equal, round, and reactive to light.  Cardiovascular:     Rate and Rhythm: Normal rate and regular rhythm.  Pulmonary:     Effort: Pulmonary effort is normal. No respiratory distress.  Abdominal:     General: There is no distension.     Tenderness: There is no abdominal tenderness.  Skin:    General: Skin is warm and dry.  Neurological:     General: No focal deficit present.     Mental Status: She is alert. Mental status is at baseline.  Psychiatric:        Mood and Affect: Mood normal.        Behavior: Behavior normal.     ED Results / Procedures / Treatments   Labs (all labs ordered are listed, but only abnormal results are displayed) Labs Reviewed  COMPREHENSIVE METABOLIC PANEL - Abnormal; Notable for the following components:      Result Value   Glucose, Bld 104 (*)    All other components within normal limits  LACTIC ACID, PLASMA - Abnormal; Notable for the following components:   Lactic Acid, Venous 3.1 (*)    All other components within normal limits  CBG MONITORING, ED - Abnormal; Notable for the following components:   Glucose-Capillary 54 (*)    All other components within normal limits  CBG MONITORING, ED - Abnormal; Notable for the following components:   Glucose-Capillary 105 (*)    All other components  within normal limits  CBG MONITORING, ED - Abnormal; Notable for the following components:   Glucose-Capillary 109 (*)    All other components within normal limits  SARS CORONAVIRUS 2 BY RT PCR  CBC  LIPASE, BLOOD  TSH  URINALYSIS, ROUTINE W REFLEX MICROSCOPIC  LACTIC ACID, PLASMA  T4, FREE  CBG MONITORING, ED    EKG EKG Interpretation  Date/Time:  Thursday September 24 2022 22:05:41 EDT Ventricular Rate:  71 PR Interval:  185 QRS Duration: 90 QT Interval:  425 QTC Calculation: 462 R Axis:   24 Text Interpretation: Sinus rhythm Low voltage, precordial leads Confirmed by Octaviano Glow [phone]) on  09/24/2022 10:55:36 PM  Radiology DG Chest Port 1 View  Result Date: 09/24/2022 CLINICAL DATA:  Infection evaluation EXAM: PORTABLE CHEST 1 VIEW COMPARISON:  09/12/2018 FINDINGS: Heart and mediastinal contours are within normal limits. No focal opacities or effusions. No acute bony abnormality. IMPRESSION: No active disease. Electronically Signed   By: Charlett Nose M.D.   On: 09/24/2022 22:20    Procedures Procedures    Medications Ordered in ED Medications  sodium chloride flush (NS) 0.9 % injection 3 mL (3 mLs Intravenous Given 09/24/22 2246)  sodium chloride flush (NS) 0.9 % injection 3 mL (3 mLs Intravenous Given 09/24/22 2236)  0.9 %  sodium chloride infusion (has no administration in time range)  sodium chloride 0.9 % bolus 1,000 mL (has no administration in time range)  sodium chloride 0.9 % bolus 1,000 mL (1,000 mLs Intravenous New Bag/Given 09/24/22 2214)  ketorolac (TORADOL) 30 MG/ML injection 30 mg (30 mg Intravenous Given 09/24/22 2200)    ED Course/ Medical Decision Making/ A&P Clinical Course as of 09/25/22 0001  Thu Sep 24, 2022  2332 Patient has not eaten dinner as her husband brought her entire [employer] meal.  Blood sugars maintained well.  Body temperature is improved. [MT]    Clinical Course User Index [MT] Jahrell Hamor, Kermit Balo, MD                            Medical Decision Making Amount and/or Complexity of Data Reviewed Labs: ordered. Radiology: ordered. ECG/medicine tests: ordered.  Risk Prescription drug management.   This patient presents to the ED with concern for hypoglycemia. This involves an extensive number of treatment options, and is a complaint that carries with it a high risk of complications and morbidity.  The differential diagnosis includes iatrogenic hypoglycemia from accidental overdose of insulin, versus infection, versus metabolic derangement, versus other  Overall, the history provided by the patient's daughter and husband, this is mostly consistent with a hypoglycemic episode related to overmedication with insulin.  Her pump did not have a monitor turned on to be up appropriately and notify her of her low blood sugar.  She also did not have anything for lunch today, which was unusual.  Her hypothermia on arrival is likely related to environmental exposure as she was lying on the ground in the air conditioned kitchen for an unknown period of time.  I have a lower suspicion for sepsis based on her work-up and presentation.  Her TSH and thyroid levels are within normal limits, does not consistent with myxedema,  Additional history obtained from patient's daughter, husband  External records from outside source obtained and reviewed including office note Sept from PCP, insulin dosing, DexCom records  I ordered and personally interpreted labs.  The pertinent results include: No significant findings.  COVID within normal limits Lactate 3.1 - nonspecific.  There is no clear indication for sepsis or infection.  She was behaving and feeling well prior to this today.  I ordered imaging studies including x-ray of the chest I independently visualized and interpreted imaging which showed no acute abnormality I agree with the radiologist interpretation  The patient was maintained on a cardiac monitor.  I personally viewed and  interpreted the cardiac monitored which showed an underlying rhythm of: Normal sinus rhythm  Per my interpretation the patient's ECG shows sinus rhythm no acute ischemic findings  I ordered medication including IV fluid bolus, IV Toradol for muscle cramping and headache  I have  reviewed the patients home medicines and have made adjustments as needed.  I advised that she not exceed 10 units of Tresiba, and that she also check her sugar regularly and discussed with her PCP.    Test Considered: Lower suspicion for CVA, meningitis, pulmonary embolism.  After the interventions noted above, I reevaluated the patient and found that they have: improved  Social Determinants of Health:Patient does have a documented history of possible early Alzheimer's dementia, is on medications for this, but overall her memory seems quite sharp to me on exam, and her husband and her daughter deny that she is having any significant cognitive issues.  I do not think this is a issue of high risk dementia, but we did discuss the concerns that the patient may accidentally dosed her double dose herself with insulin.  Her family will keep a closer eye on her and try to control her insulin  Dispostion:  Patient signed out to Dr Adela Lank EDP pending repeat lactate after IV fluids and UA check.         Final Clinical Impression(s) / ED Diagnoses Final diagnoses:  Hypoglycemia  Hypothermia, initial encounter    Rx / DC Orders ED Discharge Orders     None         Londen Lorge, Kermit Balo, MD 09/25/22 0001

## 2022-09-25 LAB — URINALYSIS, ROUTINE W REFLEX MICROSCOPIC
Bilirubin Urine: NEGATIVE
Glucose, UA: NEGATIVE mg/dL
Hgb urine dipstick: NEGATIVE
Ketones, ur: NEGATIVE mg/dL
Nitrite: NEGATIVE
Protein, ur: NEGATIVE mg/dL
Specific Gravity, Urine: 1.005 (ref 1.005–1.030)
pH: 5 (ref 5.0–8.0)

## 2022-09-25 LAB — T4, FREE: Free T4: 0.74 ng/dL (ref 0.61–1.12)

## 2022-09-25 LAB — LACTIC ACID, PLASMA: Lactic Acid, Venous: 1.9 mmol/L (ref 0.5–1.9)

## 2022-09-25 NOTE — Discharge Instructions (Signed)
Please make sure that your Dexcom monitor is working normally.  Check your blood sugars frequently in the daytime.  You should not take more than 10 units of Tresiba in the morning until you have discussed this with your doctor.  Please remember to eat at least 3 small meals a day

## 2022-09-26 DIAGNOSIS — E1165 Type 2 diabetes mellitus with hyperglycemia: Secondary | ICD-10-CM | POA: Diagnosis not present

## 2022-10-01 ENCOUNTER — Ambulatory Visit: Payer: Medicare Other | Admitting: Internal Medicine

## 2022-10-07 DIAGNOSIS — Z1231 Encounter for screening mammogram for malignant neoplasm of breast: Secondary | ICD-10-CM | POA: Diagnosis not present

## 2022-10-14 ENCOUNTER — Encounter: Payer: Self-pay | Admitting: Physician Assistant

## 2022-10-14 ENCOUNTER — Ambulatory Visit: Payer: Medicare Other | Admitting: Physician Assistant

## 2022-10-14 VITALS — Resp 20 | Ht 59.0 in | Wt 109.0 lb

## 2022-10-14 DIAGNOSIS — G3184 Mild cognitive impairment, so stated: Secondary | ICD-10-CM

## 2022-10-14 NOTE — Progress Notes (Addendum)
Assessment/Plan:   Mild Cognitive Impairment likely due to Alzheimer's disease, early onset  Haley Bartlett is a very pleasant 71 y.o. RH femalehistory of diabetes, neuropathy, B12 deficiency, anxiety, seen today in follow up for memory loss. Patient is currently on donepezil 10 mg daily.  She has significant depression, but the symptoms are very much improved from her prior evaluation in May of this year.  She chose not to undergo psychiatric evaluation.  Most recent MRI of the brain on 04/18/2022, personally reviewed only remarkable for mild chronic small vessel ischemic disease, mild cerebral atrophy, within normal limits for age without acute intracranial abnormalities.  Today's MMSE is 28/30.    Follow up in 6  months. Continue donepezil 10 mg daily, memantine 10 mg 2 times daily side effects discussed Continue to control mood as per PCP Recommend good control of cardiovascular risk factors.        Subjective:    This patient is accompanied in the office by her husband who supplements the history.  Previous records as well as any outside records available were reviewed prior to todays visit.  He was last seen on 04/17/2022.  Last MoCA on July 2022 was 10/30.   Any changes in memory since last visit?  She reports some "good and bad days ".  She may forget some conversations and names of people, but she attributes these to "depending on what the sugar levels are ".  repeats oneself?  Denies  Disoriented when walking into a room?  Patient denies   Leaving objects in unusual places?  Patient denies, but loses her phone her glucose sensor frequently.   Ambulates  with difficulty?   Patient denies  . " I never reach her because she walks too fast" Recent falls?  Patient denies   Any head injuries?  Patient denies   History of seizures?   Patient denies   Wandering behavior?  Especially if shopping, "she walks so fast that they lose her and she has to text me to see where I am  "-husband says.  Patient drives?  Occasionally when needed, and only short distances, does not get lost according to husband Any mood changes since last visit?  Patient denies.  She reports that her mood is actually improved, especially since taking care of her grandchildren and traveling more frequently. Any worsening depression?:  Less tearful than before, walking helps the mood.  She takes the Desyrel and Zoloft.  Hallucinations?  Patient denies   Paranoia?  "She may get scared faster than before ". Patient reports that sleeps well without vivid dreams, REM behavior , occasionally talks in her sleep.  She denies sleepwalking   History of sleep apnea?  Patient denies   Any hygiene concerns?  Patient denies   Independent of bathing and dressing?  Endorsed  Does the patient needs help with medications?  Husband in charge.  At times, she may mix the insulins and that is of concern.  She was recently seen in the emergency department, due to hypoglycemia because of the above. Who is in charge of the finances?  Husband is in charge   Any changes in appetite?  Patient denies  Patient have trouble swallowing? Patient denies   Does the patient cook?  Endorsed.  Sometimes she leaves the stove on the food may burn.   Any kitchen accidents such as leaving the stove on? Patient denies   Any headaches?  Occasional sinus headache Double vision? Patient denies   Any  focal numbness or tingling?  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  Patient denies   Any tremors?  Patient denies   Any history of anosmia?  Patient denies   Any incontinence of urine?  Patient denies   Any bowel dysfunction?   Patient denies      Patient lives with: husband   [place] is going to [place], and in Jan they go to [place] for fun  PREVIOUS MEDICATIONS:   CURRENT MEDICATIONS:  Outpatient Encounter Medications as of 10/14/2022  Medication Sig    donepezil (ARICEPT) 10 MG tablet Take 1 tablet daily (Patient taking differently: Take 10 mg by mouth daily.)   dorzolamide-timolol (COSOPT) 22.3-6.8 MG/ML ophthalmic solution Place 1 drop into both eyes 2 (two) times daily.   gabapentin (NEURONTIN) 100 MG capsule Take 100 mg by mouth at bedtime.   glucose blood (ACCU-CHEK GUIDE) test strip 1 each by Other route 2 (two) times daily. And lancets 2/day   HUMALOG MIX 75/25 (75-25) 100 UNIT/ML SUSP injection Inject 10-12 Units into the skin daily before supper.   insulin degludec (TRESIBA FLEXTOUCH) 100 UNIT/ML FlexTouch Pen Inject 10 Units into the skin daily. (Patient taking differently: Inject 10-12 Units into the skin daily before breakfast.)   insulin lispro (HUMALOG KWIKPEN) 100 UNIT/ML KwikPen Inject 4-6 Units into the skin 3 (three) times daily before meals. And pen needles 1/day   Insulin Pen Needle 32G X 4 MM MISC Use 4x a day   latanoprost (XALATAN) 0.005 % ophthalmic solution Place 1 drop into both eyes at bedtime.   levothyroxine (SYNTHROID, LEVOTHROID) 25 MCG tablet Take 25 mcg by mouth daily before breakfast.   memantine (NAMENDA) 10 MG tablet Take 1 tablet every night for 2 weeks, then increase to 1 tablet twice a day (Patient taking differently: Take 10 mg by mouth in the morning and at bedtime.)   sertraline (ZOLOFT) 50 MG tablet Take 50 mg by mouth in the morning and at bedtime.   traZODone (DESYREL) 100 MG tablet Take 100 mg by mouth at bedtime.   No facility-administered encounter medications on file as of 10/14/2022.       10/23/2016    9:11 AM  MMSE - Mini Mental State Exam  Orientation to time 4  Orientation to Place 3  Registration 3  Attention/ Calculation 5  Recall 3  Language- name 2 objects 2  Language- repeat 1  Language- follow 3 step command 3  Language- read & follow direction 1  Write a sentence 1  Copy design 1  Total score 27      06/30/2021   11:00 AM  Montreal Cognitive Assessment   Visuospatial/  Executive (0/5) 1  Naming (0/3) 1  Attention: Read list of digits (0/2) 0  Attention: Read list of letters (0/1) 0  Attention: Serial 7 subtraction starting at 100 (0/3) 1  Language: Repeat phrase (0/2) 0  Language : Fluency (0/1) 0  Abstraction (0/2) 1  Delayed Recall (0/5) 0  Orientation (0/6) 5  Total 9  Adjusted Score (based on education) 10    Objective:     PHYSICAL EXAMINATION:    VITALS:   Vitals:   10/14/22 1306  Resp: 20  Weight: 109 lb (49.4 kg)  Height: 4\' 11"  (1.499 m)    GEN:  The patient appears stated age and is in NAD. HEENT:  Normocephalic, atraumatic.   Neurological examination:  General: NAD, well-groomed,  appears stated age. Orientation: The patient is alert. Oriented to person, place and date Cranial nerves: There is good facial symmetry.The speech is fluent and clear. No aphasia or dysarthria. Fund of knowledge is appropriate. Recent memory is impaired, remote memory is normal.Attention and concentration are normal.  Able to name objects and repeat phrases.  Hearing is intact to conversational tone.    Motor: Strength is at least antigravity x4. Tremors: none  DTR's 2/4 in UE/LE     Movement examination: Tone: There is normal tone in the UE/LE Abnormal movements:  no tremor.  No myoclonus.  No asterixis.   Coordination:  There is no decremation with RAM's. Normal finger to nose  Gait and Station: The patient has no difficulty arising out of a deep-seated chair without the use of the hands. The patient's stride length is good.  Gait is cautious and narrow.    Thank you for allowing [place] the opportunity to participate in the care of this nice patient. Please do not hesitate to contact us for any questions or concerns.   Total time spent on today's visit was 30 minutes dedicated to this patient today, preparing to see patient, examining the patient, ordering tests and/or medications and counseling the patient, documenting clinical information in the  EHR or other health record, independently interpreting results and communicating results to the patient/family, discussing treatment and goals, answering patient's questions and coordinating care.  Cc:  Jarrett Soho, PA-C  Marlowe Kays 10/14/2022 2:01 PM

## 2022-10-14 NOTE — Patient Instructions (Addendum)
It was a pleasure to see you today at our office.   Recommendations:  Follow up in  6 months Continue donepezil 10 mg daily. Side effects were discussed  Continue Memantine 10 mg twice daily.Side effects were discussed  Monitor the sugars    Whom to call:  Memory  decline, memory medications: Call our office [phone]   For psychiatric meds, mood meds: Please have your primary care physician manage these medications.   Counseling regarding caregiver distress, including caregiver depression, anxiety and issues regarding community resources, adult day care programs, adult living facilities, or memory care questions:   Feel free to contact Misty Lisabeth Register, Social Worker at [phone]   For assessment of decision of mental capacity and competency:  Call Dr. Erick Blinks, geriatric psychiatrist at [phone]  For guidance in geriatric dementia issues please call [facility] [phone]  For guidance regarding [facility] and if placement were needed at the facility, contact Sidney Ace, Social Worker tel: [phone]  If you have any severe symptoms of a stroke, or other severe issues such as confusion,severe chills or fever, etc call 911 or go to the ER as you may need to be evaluated further   Feel free to visit Facebook page " [email or web address]" for tips of how to care for people with memory problems.       RECOMMENDATIONS FOR ALL PATIENTS WITH MEMORY PROBLEMS: 1. Continue to exercise (Recommend 30 minutes of walking everyday, or 3 hours every week) 2. Increase social interactions - continue going to [place] and enjoy social gatherings with friends and family 3. Eat healthy, avoid fried foods and eat more fruits and vegetables 4. Maintain adequate blood pressure, blood sugar, and blood cholesterol level. Reducing the risk of stroke and cardiovascular disease also helps promoting better memory. 5. Avoid stressful situations. Live a simple life  and avoid aggravations. Organize your time and prepare for the next day in anticipation. 6. Sleep well, avoid any interruptions of sleep and avoid any distractions in the bedroom that may interfere with adequate sleep quality 7. Avoid sugar, avoid sweets as there is a strong link between excessive sugar intake, diabetes, and cognitive impairment We discussed the Mediterranean diet, which has been shown to help patients reduce the risk of progressive memory disorders and reduces cardiovascular risk. This includes eating fish, eat fruits and green leafy vegetables, nuts like almonds and hazelnuts, walnuts, and also use olive oil. Avoid fast foods and fried foods as much as possible. Avoid sweets and sugar as sugar use has been linked to worsening of memory function.  There is always a concern of gradual progression of memory problems. If this is the case, then we may need to adjust level of care according to patient needs. Support, both to the patient and caregiver, should then be put into place.    The Alzheimer's Association is here all day, every day for people facing Alzheimer's disease through our free 24/7 Helpline: [phone]. The Helpline provides reliable information and support to all those who need assistance, such as individuals living with memory loss, Alzheimer's or other dementia, caregivers, health care professionals and the public.  Our highly trained and knowledgeable staff can help you with: Understanding memory loss, dementia and Alzheimer's  Medications and other treatment options  General information about aging and brain health  Skills to provide quality care and to find the best care from professionals  Legal, financial and living-arrangement decisions Our Helpline also features: Confidential care consultation provided by  master's level clinicians who can help with decision-making support, crisis assistance and education on issues families face every day  Help in a caller's  preferred language using our translation service that features more than 200 languages and dialects  Referrals to local community programs, services and ongoing support     FALL PRECAUTIONS: Be cautious when walking. Scan the area for obstacles that may increase the risk of trips and falls. When getting up in the mornings, sit up at the edge of the bed for a few minutes before getting out of bed. Consider elevating the bed at the head end to avoid drop of blood pressure when getting up. Walk always in a well-lit room (use night lights in the walls). Avoid area rugs or power cords from appliances in the middle of the walkways. Use a walker or a cane if necessary and consider physical therapy for balance exercise. Get your eyesight checked regularly.  FINANCIAL OVERSIGHT: Supervision, especially oversight when making financial decisions or transactions is also recommended.  HOME SAFETY: Consider the safety of the kitchen when operating appliances like stoves, microwave oven, and blender. Consider having supervision and share cooking responsibilities until no longer able to participate in those. Accidents with firearms and other hazards in the house should be identified and addressed as well.   ABILITY TO BE LEFT ALONE: If patient is unable to contact 911 operator, consider using LifeLine, or when the need is there, arrange for someone to stay with patients. Smoking is a fire hazard, consider supervision or cessation. Risk of wandering should be assessed by caregiver and if detected at any point, supervision and safe proof recommendations should be instituted.  MEDICATION SUPERVISION: Inability to self-administer medication needs to be constantly addressed. Implement a mechanism to ensure safe administration of the medications.   DRIVING: Regarding driving, in patients with progressive memory problems, driving will be impaired. We advise to have someone else do the driving if trouble finding  directions or if minor accidents are reported. Independent driving assessment is available to determine safety of driving.   If you are interested in the driving assessment, you can contact the following:  [facility] [employer] [facility] in [place] [phone]  [facility] [phone]  [facility] [phone] or [phone]      Mediterranean Diet A Mediterranean diet refers to food and lifestyle choices that are based on the traditions of countries located on the [employer]. This way of eating has been shown to help prevent certain conditions and improve outcomes for people who have chronic diseases, like kidney disease and heart disease. What are tips for following this plan? Lifestyle  Cook and eat meals together with your family, when possible. Drink enough fluid to keep your urine clear or pale yellow. Be physically active every day. This includes: Aerobic exercise like running or swimming. Leisure activities like gardening, walking, or housework. Get 7-8 hours of sleep each night. If recommended by your health care provider, drink red wine in moderation. This means 1 glass a day for nonpregnant women and 2 glasses a day for men. A glass of wine equals 5 oz (150 mL). Reading food labels  Check the serving size of packaged foods. For foods such as rice and pasta, the serving size refers to the amount of cooked product, not dry. Check the total fat in packaged foods. Avoid foods that have saturated fat or trans fats. Check the ingredients list for added sugars, such as corn syrup. Shopping  At the grocery store, buy most of your food from the areas near the walls of the store. This includes: Fresh fruits and vegetables (produce). Grains, beans, nuts, and seeds. Some of these may be available in unpackaged forms or large amounts (in bulk). Fresh seafood. Poultry and eggs. Low-fat dairy products. Buy whole  ingredients instead of prepackaged foods. Buy fresh fruits and vegetables in-season from local farmers markets. Buy frozen fruits and vegetables in resealable bags. If you do not have access to quality fresh seafood, buy precooked frozen shrimp or canned fish, such as tuna, salmon, or sardines. Buy small amounts of raw or cooked vegetables, salads, or olives from the deli or salad bar at your store. Stock your pantry so you always have certain foods on hand, such as olive oil, canned tuna, canned tomatoes, rice, pasta, and beans. Cooking  Cook foods with extra-virgin olive oil instead of using butter or other vegetable oils. Have meat as a side dish, and have vegetables or grains as your main dish. This means having meat in small portions or adding small amounts of meat to foods like pasta or stew. Use beans or vegetables instead of meat in common dishes like chili or lasagna. Experiment with different cooking methods. Try roasting or broiling vegetables instead of steaming or sauteing them. Add frozen vegetables to soups, stews, pasta, or rice. Add nuts or seeds for added healthy fat at each meal. You can add these to yogurt, salads, or vegetable dishes. Marinate fish or vegetables using olive oil, lemon juice, garlic, and fresh herbs. Meal planning  Plan to eat 1 vegetarian meal one day each week. Try to work up to 2 vegetarian meals, if possible. Eat seafood 2 or more times a week. Have healthy snacks readily available, such as: Vegetable sticks with hummus. Greek yogurt. Fruit and nut trail mix. Eat balanced meals throughout the week. This includes: Fruit: 2-3 servings a day Vegetables: 4-5 servings a day Low-fat dairy: 2 servings a day Fish, poultry, or lean meat: 1 serving a day Beans and legumes: 2 or more servings a week Nuts and seeds: 1-2 servings a day Whole grains: 6-8 servings a day Extra-virgin olive oil: 3-4 servings a day Limit red meat and sweets to only a few servings  a month What are my food choices? Mediterranean diet Recommended Grains: Whole-grain pasta. Brown rice. Bulgar wheat. Polenta. Couscous. Whole-wheat bread. Orpah Cobb. Vegetables: Artichokes. Beets. Broccoli. Cabbage. Carrots. Eggplant. Green beans. Chard. Kale. Spinach. Onions. Leeks. Peas. Squash. Tomatoes. Peppers. Radishes. Fruits: Apples. Apricots. Avocado. Berries. Bananas. Cherries. Dates. Figs. Grapes. Lemons. Melon. Oranges. Peaches. Plums. Pomegranate. Meats and other protein foods: Beans. Almonds. Sunflower seeds. Pine nuts. Peanuts. Cod. Salmon. Scallops. Shrimp. Tuna. Tilapia. Clams. Oysters. Eggs. Dairy: Low-fat milk. Cheese. Greek yogurt. Beverages: Water. Red wine. Herbal tea. Fats and oils: Extra virgin olive oil. Avocado oil. Grape seed oil. Sweets and desserts: [place] yogurt with honey. Baked apples. Poached pears. Trail mix. Seasoning and other foods: Basil. Cilantro. Coriander. Cumin. Mint. Parsley. Sage. Rosemary. Tarragon. Garlic. Oregano. Thyme. Pepper. Balsalmic vinegar. Tahini. Hummus. Tomato sauce. Olives. Mushrooms. Limit these Grains: Prepackaged pasta or rice dishes. Prepackaged cereal with added sugar. Vegetables: Deep fried potatoes (french fries). Fruits: Fruit canned in syrup. Meats and other protein foods: Beef. Pork. Lamb. Poultry with skin. Hot dogs. Tomasa Blase. Dairy: Ice cream. Sour cream. Whole milk. Beverages: Juice. Sugar-sweetened soft drinks. Beer. Liquor and spirits. Fats and oils: Butter. Canola oil. Vegetable oil. Beef fat (tallow). Lard. Sweets and desserts: Cookies.  Cakes. Pies. Candy. Seasoning and other foods: Mayonnaise. Premade sauces and marinades. The items listed may not be a complete list. Talk with your dietitian about what dietary choices are right for you. Summary The Mediterranean diet includes both food and lifestyle choices. Eat a variety of fresh fruits and vegetables, beans, nuts, seeds, and whole grains. Limit the amount of  red meat and sweets that you eat. Talk with your health care provider about whether it is safe for you to drink red wine in moderation. This means 1 glass a day for nonpregnant women and 2 glasses a day for men. A glass of wine equals 5 oz (150 mL). This information is not intended to replace advice given to you by your health care provider. Make sure you discuss any questions you have with your health care provider. Document Released: 07/16/2016 Document Revised: 08/18/2016 Document Reviewed: 07/16/2016 Elsevier Interactive Patient Education  2017 [employer].

## 2022-10-20 ENCOUNTER — Ambulatory Visit: Payer: Medicare Other | Admitting: Physician Assistant

## 2022-10-27 DIAGNOSIS — E1165 Type 2 diabetes mellitus with hyperglycemia: Secondary | ICD-10-CM | POA: Diagnosis not present

## 2022-11-18 ENCOUNTER — Telehealth: Payer: Self-pay | Admitting: Internal Medicine

## 2022-11-18 NOTE — Telephone Encounter (Signed)
Forms placed on provider desk to be completed.

## 2022-11-18 NOTE — Telephone Encounter (Signed)
Patient's spouse, Zola Runion, brought in [employer] Patient Surveyor, quantity.  The application is in Dr. Charlean Sanfilippo folder in front office.

## 2022-11-20 ENCOUNTER — Other Ambulatory Visit: Payer: Self-pay

## 2022-11-20 DIAGNOSIS — E11319 Type 2 diabetes mellitus with unspecified diabetic retinopathy without macular edema: Secondary | ICD-10-CM

## 2022-11-20 MED ORDER — TRESIBA FLEXTOUCH 100 UNIT/ML ~~LOC~~ SOPN: 10.0000 [IU] | PEN_INJECTOR | Freq: Every day | SUBCUTANEOUS | 3 refills | Status: DC

## 2022-11-26 DIAGNOSIS — E1165 Type 2 diabetes mellitus with hyperglycemia: Secondary | ICD-10-CM | POA: Diagnosis not present

## 2022-12-09 ENCOUNTER — Ambulatory Visit: Payer: Medicare Other | Admitting: Internal Medicine

## 2022-12-27 DIAGNOSIS — E1165 Type 2 diabetes mellitus with hyperglycemia: Secondary | ICD-10-CM | POA: Diagnosis not present

## 2022-12-28 DIAGNOSIS — R051 Acute cough: Secondary | ICD-10-CM | POA: Diagnosis not present

## 2022-12-28 DIAGNOSIS — R6883 Chills (without fever): Secondary | ICD-10-CM | POA: Diagnosis not present

## 2022-12-28 DIAGNOSIS — R5383 Other fatigue: Secondary | ICD-10-CM | POA: Diagnosis not present

## 2022-12-28 DIAGNOSIS — J029 Acute pharyngitis, unspecified: Secondary | ICD-10-CM | POA: Diagnosis not present

## 2022-12-28 DIAGNOSIS — Z03818 Encounter for observation for suspected exposure to other biological agents ruled out: Secondary | ICD-10-CM | POA: Diagnosis not present

## 2022-12-31 DIAGNOSIS — H40053 Ocular hypertension, bilateral: Secondary | ICD-10-CM | POA: Diagnosis not present

## 2023-01-01 ENCOUNTER — Encounter: Payer: Self-pay | Admitting: Internal Medicine

## 2023-01-01 ENCOUNTER — Other Ambulatory Visit: Payer: Self-pay

## 2023-01-01 DIAGNOSIS — Z794 Long term (current) use of insulin: Secondary | ICD-10-CM

## 2023-01-01 MED ORDER — INSULIN LISPRO (1 UNIT DIAL) 100 UNIT/ML (KWIKPEN): 4.0000 [IU] | PEN_INJECTOR | Freq: Three times a day (TID) | SUBCUTANEOUS | 3 refills | Status: DC

## 2023-01-15 ENCOUNTER — Encounter: Payer: Self-pay | Admitting: Internal Medicine

## 2023-01-15 ENCOUNTER — Ambulatory Visit: Payer: Medicare Other | Admitting: Internal Medicine

## 2023-01-15 DIAGNOSIS — Z794 Long term (current) use of insulin: Secondary | ICD-10-CM

## 2023-01-15 DIAGNOSIS — E1165 Type 2 diabetes mellitus with hyperglycemia: Secondary | ICD-10-CM | POA: Diagnosis not present

## 2023-01-15 DIAGNOSIS — E119 Type 2 diabetes mellitus without complications: Secondary | ICD-10-CM

## 2023-01-15 DIAGNOSIS — E11319 Type 2 diabetes mellitus with unspecified diabetic retinopathy without macular edema: Secondary | ICD-10-CM

## 2023-01-15 LAB — HEMOGLOBIN A1C: Hemoglobin A1C: 7.9

## 2023-01-15 MED ORDER — INSULIN LISPRO (1 UNIT DIAL) 100 UNIT/ML (KWIKPEN): 4.0000 [IU] | PEN_INJECTOR | Freq: Three times a day (TID) | SUBCUTANEOUS | 3 refills | Status: DC

## 2023-01-15 MED ORDER — GLUCAGON 3 MG/DOSE NA POWD: 3.0000 mg | Freq: Once | NASAL | 11 refills | Status: DC | PRN

## 2023-01-15 MED ORDER — TRESIBA FLEXTOUCH 100 UNIT/ML ~~LOC~~ SOPN: 10.0000 [IU] | PEN_INJECTOR | Freq: Every day | SUBCUTANEOUS | 3 refills | Status: DC

## 2023-01-15 MED ORDER — ACCU-CHEK SOFTCLIX LANCETS MISC: 3 refills | Status: AC

## 2023-01-15 NOTE — Progress Notes (Signed)
Patient ID: Haley Bartlett, female   DOB: May 05, 1951, 72 y.o.   MRN: [id number]  HPI: Haley Bartlett is a 72 y.o.-year-old female, returning for follow-up for DM2, dx in 1993, on insulin since 2002, uncontrolled, with complications (DR).  She previously saw Dr. Loanne Drilling, last visit 4 months ago. She is accompanied by her husband who translates for [place].   Interim history: No increased urination, blurry vision, nausea, chest pain. Since last visit, she was not emergency room with hypoglycemia 09/24/2022.  She was found at home on the floor, by her daughter. She took her insulin, but did not eat lunch, which is unusual for her.  Glucose was 26 on Dexcom.  She did not hear the alarm.  It was noted that she was using higher doses of insulin than recommended around the time of the episode.  Reviewed HbA1c: 07/30/2022: HbA1c 7.7% Lab Results  Component Value Date   HGBA1C 8.6 (A) 04/17/2022   HGBA1C 8.5 (A) 01/08/2022   HGBA1C 8.2 (A) 09/30/2021   HGBA1C 7.8 (A) 08/26/2021   HGBA1C 6.9 (A) 04/22/2021   HGBA1C 6.9 (A) 02/18/2021  03/26/2022: HbA1c 9.0%, C-peptide 1.6, glucose 217 08/13/2010: GAD antibody <1, Glu 164, C peptide 1.1  She was previously on: - Metformin 500 mg 2x a day - Prandin 1 mg 3 times a day before meals - Actos 30 mg before breakfast - Farxiga 10 mg before breakfast - Humalog 11 units before the 3 meals  - only if sugars >300 She was taken off insulin but restarted 01/2022. Januvia and Rybelsus were too expensive.  At last visit she was not taking the recommended regimen: - Metformin 500 mg 2x a day >> not taken since last OV 2/2 UTIs - Tresiba 10 >> she decreased to 6 units daily 2/2 lows - Humalog 4-5  units 15 min before each meal and increase the dose as needed >> 6 units after dinner  I recommended to change to: [place] 6 units daily >> however, per review of ED notes, she was taking 10-12 units daily at the time of the hypoglycemia >> 10 units daily  now - Humalog 4-6 units 15 min before EACH MEAL >> however,per review of ED notes,  taking 10 to 12 units before dinner only at the time of the hypoglycemia >> 7-8 units 3-4x a day now  Pt checks her sugars >4x a day and they are -with receiver:  Previously: - am: 110-140 - 2h after b'fast: 300s - lunch: 120-150 - 2h after lunch: ? - dinner: 100s - 2h after dinner: up to 300 - bedtime: ?  Previously:   Lowest sugar was 120 >> 67; she has hypoglycemia awareness at 70.  Highest sugar was HI >> 400.  Glucometer: Accu-Chek guide  - last BUN/creatinine:  Lab Results  Component Value Date   BUN 12 09/24/2022   BUN 9 04/18/2022   CREATININE 0.83 09/24/2022   CREATININE 0.80 04/18/2022  She is not on ACE inhibitor/ARB.  -+ HL; last set of lipids: Lipid Panel[303756]     Collected: 03/26/2022 04:20 PM     Specimen Received: 03/26/2022 05:00 AM       Cholesterol, Total [001065] 247 mg/dL H 100-199 mg/dL  Triglycerides [001172] 85 mg/dL Normal 0-149 mg/dL  HDL Cholesterol [011817] 104 mg/dL Normal >39 mg/dL  VLDL Cholesterol Cal [011919] 14 mg/dL Normal 5-40 mg/dL  LDL Chol Calc (NIH) [012059] 129 mg/dL H 0-99 mg/dL  No results found for: "CHOL", "HDL", "LDLCALC", "LDLDIRECT", "  TRIG", "CHOLHDL" She is intolerant to statins.  - last eye exam was in 01/2023: + DR reportedly.   - + numbness and tingling in her feet.  Last foot exam 09/2021.  She is on Neurontin 100 mg at bedtime.  She also has hypothyroidism - on levothyroxine 25 mcg daily, taken: - in am - fasting - at least 1h from b'fast - no calcium - no iron - + multivitamins at lunchtime - no PPIs - not on Biotin  Reviewed TSH levels: Lab Results  Component Value Date   TSH 1.615 09/24/2022  03/17/2022: TSH 2.0  ROS: + See HPI  Past Medical History:  Diagnosis Date   Depression    Past Surgical History:  Procedure Laterality Date   ABDOMINAL HYSTERECTOMY     APPENDECTOMY     THYROIDECTOMY     Social  History   Socioeconomic History   Marital status: Married    Spouse name: Medical laboratory scientific officer   Number of children: 2   Years of education: 12   Highest education level: Not on file  Occupational History   Occupation: retired  Tobacco Use   Smoking status: Never   Smokeless tobacco: Never  Vaping Use   Vaping Use: Never used  Substance and Sexual Activity   Alcohol use: No   Drug use: No   Sexual activity: Not on file  Other Topics Concern   Not on file  Social History Narrative   Lives with [name], husband   Caffeine use: Coffee daily   Right handed    Social Determinants of Health   Financial Resource Strain: Not on file  Food Insecurity: Not on file  Transportation Needs: Not on file  Physical Activity: Not on file  Stress: Not on file  Social Connections: Not on file  Intimate Partner Violence: Not on file   Current Outpatient Medications on File Prior to Visit  Medication Sig Dispense Refill   donepezil (ARICEPT) 10 MG tablet Take 1 tablet daily (Patient taking differently: Take 10 mg by mouth daily.) 30 tablet 11   dorzolamide-timolol (COSOPT) 22.3-6.8 MG/ML ophthalmic solution Place 1 drop into both eyes 2 (two) times daily.     gabapentin (NEURONTIN) 100 MG capsule Take 100 mg by mouth at bedtime.     glucose blood (ACCU-CHEK GUIDE) test strip 1 each by Other route 2 (two) times daily. And lancets 2/day 200 each 3   HUMALOG MIX 75/25 (75-25) 100 UNIT/ML SUSP injection Inject 10-12 Units into the skin daily before supper.     insulin degludec (TRESIBA FLEXTOUCH) 100 UNIT/ML FlexTouch Pen Inject 10 Units into the skin daily. 9 mL 3   insulin lispro (HUMALOG KWIKPEN) 100 UNIT/ML KwikPen Inject 4-6 Units into the skin 3 (three) times daily before meals. 30 mL 3   Insulin Pen Needle 32G X 4 MM MISC Use 4x a day 300 each 3   latanoprost (XALATAN) 0.005 % ophthalmic solution Place 1 drop into both eyes at bedtime.     levothyroxine (SYNTHROID, LEVOTHROID) 25 MCG tablet Take 25 mcg  by mouth daily before breakfast.     memantine (NAMENDA) 10 MG tablet Take 1 tablet every night for 2 weeks, then increase to 1 tablet twice a day (Patient taking differently: Take 10 mg by mouth in the morning and at bedtime.) 60 tablet 11   sertraline (ZOLOFT) 50 MG tablet Take 50 mg by mouth in the morning and at bedtime.     traZODone (DESYREL) 100 MG tablet Take 100 mg  by mouth at bedtime.     No current facility-administered medications on file prior to visit.   Allergies  Allergen Reactions   Atorvastatin Other (See Comments)    Reaction??   Other Rash and Other (See Comments)    A wrinkle cream (name not recalled) broke out the chest and face   Family History  Problem Relation Age of Onset   Dementia Sister    Diabetes Sister    Diabetes Mother    Diabetes Brother    PE: BP 118/70   Pulse 77   Wt 114 lb 2 oz (51.8 kg)   BMI 23.05 kg/m  Wt Readings from Last 3 Encounters:  01/15/23 114 lb 2 oz (51.8 kg)  10/14/22 109 lb (49.4 kg)  09/24/22 110 lb (49.9 kg)   Constitutional: thin, in NAD Eyes: EOMI, no exophthalmos ENT: no thyromegaly, no cervical lymphadenopathy Cardiovascular: RRR, No MRG Respiratory: CTA B Musculoskeletal: no deformities Skin: warm, no rashes Neurological: no tremor with outstretched hands  ASSESSMENT: 1. DM2, insulin-dependent, uncontrolled, with complications - DR  Component     Latest Ref Rng 08/20/2022  Glucose     65 - 99 mg/dL 270 (H)   Islet Cell Ab     Neg:<1:1  Negative   C-Peptide     0.80 - 3.85 ng/mL 1.04   ZNT8 Antibodies     <15 U/mL <10   Glutamic Acid Decarb Ab     <5 IU/mL <5   Glucose is too high to thoroughly interpret the C-peptide, however, it is encouraging that this is not suppressed.  The antipancreatic antibodies are not elevated.  2. HL  3.  Hypothyroidism  PLAN:  1. Patient with longstanding, uncontrolled, diabetes, on basal/bolus insulin regimen, with noncompliance with the recommended regimen.  She  has early Alzheimer's dementia.  At last visit, she did not have the CGM with her blood sugars were at goal before meals and she was increasing significantly after eating.  Upon questioning, she was still not taking Humalog before every meal, as suggested and only at night.  She was not taking it 15 minutes before dinner.  We discussed that taking Humalog after the meal can cause hypoglycemia overnight.  Indeed, she had episodes of low blood sugars down to the 60s.  I advised her to decrease the dose of Humalog and take it before each meal.  Also, I advised her to take only 6 units of Tresiba, which was lower than recommended in the past, but I did not advise her to increase due to lows at night.   -she had an episode of hypoglycemia since last visit and at that time it was mentioned that she took insulin and did not eat.  She mentions that she took the insulin for breakfast in the morning, did not eat a formal lunch so she did not take insulin for what she ate, and the episode happened before dinner.  It is unclear, but per my understanding, she took Humalog before the meal and then she went to take a shower.  She got an alarm on the CGM when she was in the shower, but she got out, sat on the couch, and passed out afterwards.   -At last visit, we checked her for insulin deficiency and antipancreatic antibodies and investigation was negative. CGM interpretation: -At today's visit, we reviewed her CGM downloads: It appears that 22% of values are in target range (goal >70%), while 78% are higher than 180 (goal <25%), and 0%  are lower than 70 (goal <4%).  The calculated average blood sugar is 231.  The projected HbA1c for the next 3 months (GMI) is 8.8%. -Reviewing the CGM trends, sugars appear to be very high throughout the day, increasing significantly after breakfast and staying elevated until approximately 6 PM, when they usually drop abruptly and increase afterwards.  From what I can understand from the  patient, she takes approximately 8 units before breakfast and 8 units before dinner.  She waits 20 minutes from taking the insulin until she can eat.  To avoid further hypoglycemia before meals, advised her to reduce the time to only 10 minutes.  Her sugars are significantly elevated after breakfast so I advised her to increase the Humalog before this meal by 1 units.  She is not eating a formal lunch but she is almost always eating something and this usually contains carbs so I advised her to bolus approximately 4 units of Humalog before this meal.  As sugars are dropping abruptly after dinner, we discussed about reducing the amount of insulin with dinner to 4-5 units.  For now, I advised her to continue the same dose of Tresiba. - I suggested to:  Patient Instructions   Please use the following regimen: - Tresiba 10 units daily - Humalog 10 min before EACH MEAL 9 units before b'fast 4 units before lunch 4-5 units before dinner  Continue Levothyroxine 25 mcg daily.  Take the thyroid hormone every day, with water, at least 30 minutes before breakfast, separated by at least 4 hours from: - acid reflux medications - calcium - iron - multivitamins  Please return in 3-4 months.  - we checked her HbA1c: 7.9% (lower) - advised to check sugars at different times of the day - 4x a day, rotating check times - advised for yearly eye exams >> she is UTD - return to clinic in 3-4 months  2. HL -Reviewed the latest lipid panel from 03/2022: LDL above target, the rest the fractions at goal No results found for: "CHOL", "HDL", "LDLCALC", "LDLDIRECT", "TRIG", "CHOLHDL" -She is intolerant to statins  3.  Hypothyroidism - latest thyroid labs reviewed with pt. >> normal: Lab Results  Component Value Date   TSH 1.615 09/24/2022  - she continues on LT4 25 mcg daily - pt feels good on this dose. - we discussed about taking the thyroid hormone every day, with water, >30 minutes before breakfast,  separated by >4 hours from acid reflux medications, calcium, iron, multivitamins. Pt. is taking it correctly.  Philemon Kingdom, MD PhD [facility] Endocrinology

## 2023-01-15 NOTE — Patient Instructions (Signed)
  Please use the following regimen: - Tresiba 10 units daily - Humalog 10 min before EACH MEAL 9 units before b'fast 4 units before lunch 4-5 units before dinner  Continue Levothyroxine 25 mcg daily.  Take the thyroid hormone every day, with water, at least 30 minutes before breakfast, separated by at least 4 hours from: - acid reflux medications - calcium - iron - multivitamins  Please return in 3-4 months.

## 2023-01-26 ENCOUNTER — Encounter: Payer: Self-pay | Admitting: Internal Medicine

## 2023-01-27 DIAGNOSIS — E1165 Type 2 diabetes mellitus with hyperglycemia: Secondary | ICD-10-CM | POA: Diagnosis not present

## 2023-02-02 DIAGNOSIS — G4700 Insomnia, unspecified: Secondary | ICD-10-CM | POA: Diagnosis not present

## 2023-02-05 ENCOUNTER — Encounter: Payer: Self-pay | Admitting: Internal Medicine

## 2023-02-08 MED ORDER — GLUCAGON 3 MG/DOSE NA POWD: 3.0000 mg | Freq: Once | NASAL | 11 refills | Status: AC | PRN

## 2023-02-17 ENCOUNTER — Encounter: Payer: Self-pay | Admitting: Internal Medicine

## 2023-02-18 DIAGNOSIS — R293 Abnormal posture: Secondary | ICD-10-CM | POA: Diagnosis not present

## 2023-02-18 DIAGNOSIS — M542 Cervicalgia: Secondary | ICD-10-CM | POA: Diagnosis not present

## 2023-02-19 ENCOUNTER — Encounter: Payer: Self-pay | Admitting: Physician Assistant

## 2023-02-19 NOTE — Telephone Encounter (Signed)
Not sure, but if severe, needs to be seen at Urgent care or ER, or contact primary doctor  because we do not treat pain  here.

## 2023-02-25 DIAGNOSIS — E1165 Type 2 diabetes mellitus with hyperglycemia: Secondary | ICD-10-CM | POA: Diagnosis not present

## 2023-03-06 ENCOUNTER — Other Ambulatory Visit: Payer: Self-pay

## 2023-03-06 ENCOUNTER — Encounter: Payer: Self-pay | Admitting: Internal Medicine

## 2023-03-06 DIAGNOSIS — E11319 Type 2 diabetes mellitus with unspecified diabetic retinopathy without macular edema: Secondary | ICD-10-CM

## 2023-03-06 MED ORDER — TRESIBA FLEXTOUCH 100 UNIT/ML ~~LOC~~ SOPN: 10.0000 [IU] | PEN_INJECTOR | Freq: Every day | SUBCUTANEOUS | 0 refills | Status: DC

## 2023-03-09 ENCOUNTER — Encounter: Payer: Self-pay | Admitting: Internal Medicine

## 2023-03-18 ENCOUNTER — Other Ambulatory Visit: Payer: Self-pay

## 2023-03-18 DIAGNOSIS — E11319 Type 2 diabetes mellitus with unspecified diabetic retinopathy without macular edema: Secondary | ICD-10-CM

## 2023-03-18 MED ORDER — OMNIPOD DASH PDM (GEN 4) KIT: PACK | 0 refills | Status: DC

## 2023-03-18 MED ORDER — OMNIPOD DASH PODS (GEN 4) MISC: 3 refills | Status: DC

## 2023-03-18 MED ORDER — OMNIPOD DASH INTRO (GEN 4) KIT: PACK | 0 refills | Status: DC

## 2023-03-28 DIAGNOSIS — E1165 Type 2 diabetes mellitus with hyperglycemia: Secondary | ICD-10-CM | POA: Diagnosis not present

## 2023-04-01 DIAGNOSIS — M8589 Other specified disorders of bone density and structure, multiple sites: Secondary | ICD-10-CM | POA: Diagnosis not present

## 2023-04-11 ENCOUNTER — Other Ambulatory Visit: Payer: Self-pay | Admitting: Neurology

## 2023-04-12 ENCOUNTER — Other Ambulatory Visit: Payer: Self-pay

## 2023-04-12 DIAGNOSIS — E782 Mixed hyperlipidemia: Secondary | ICD-10-CM | POA: Diagnosis not present

## 2023-04-12 DIAGNOSIS — E039 Hypothyroidism, unspecified: Secondary | ICD-10-CM | POA: Diagnosis not present

## 2023-04-12 DIAGNOSIS — G4700 Insomnia, unspecified: Secondary | ICD-10-CM | POA: Diagnosis not present

## 2023-04-12 DIAGNOSIS — E1165 Type 2 diabetes mellitus with hyperglycemia: Secondary | ICD-10-CM | POA: Diagnosis not present

## 2023-04-12 MED ORDER — MEMANTINE HCL 10 MG PO TABS: ORAL_TABLET | ORAL | 0 refills | Status: DC

## 2023-04-15 ENCOUNTER — Telehealth: Payer: Self-pay | Admitting: Anesthesiology

## 2023-04-15 ENCOUNTER — Other Ambulatory Visit: Payer: Self-pay

## 2023-04-15 MED ORDER — MEMANTINE HCL 10 MG PO TABS: ORAL_TABLET | ORAL | 0 refills | Status: DC

## 2023-04-15 NOTE — Telephone Encounter (Signed)
Pt's husband called stating pt's Memantine Rx was sent to [facility], states the Rx needs to go to [facility] that way pt wont pay for it. States if Rx goes to [place] she has to pay.

## 2023-04-15 NOTE — Telephone Encounter (Signed)
Sent to [facility]

## 2023-04-22 DIAGNOSIS — H2513 Age-related nuclear cataract, bilateral: Secondary | ICD-10-CM | POA: Diagnosis not present

## 2023-04-27 ENCOUNTER — Other Ambulatory Visit: Payer: Self-pay

## 2023-04-27 DIAGNOSIS — M79641 Pain in right hand: Secondary | ICD-10-CM | POA: Diagnosis not present

## 2023-04-27 DIAGNOSIS — E1165 Type 2 diabetes mellitus with hyperglycemia: Secondary | ICD-10-CM | POA: Diagnosis not present

## 2023-04-27 DIAGNOSIS — Z794 Long term (current) use of insulin: Secondary | ICD-10-CM

## 2023-04-27 DIAGNOSIS — E11319 Type 2 diabetes mellitus with unspecified diabetic retinopathy without macular edema: Secondary | ICD-10-CM

## 2023-04-27 MED ORDER — INSULIN LISPRO (1 UNIT DIAL) 100 UNIT/ML (KWIKPEN): 4.0000 [IU] | PEN_INJECTOR | Freq: Three times a day (TID) | SUBCUTANEOUS | 3 refills | Status: DC

## 2023-04-27 MED ORDER — TRESIBA FLEXTOUCH 100 UNIT/ML ~~LOC~~ SOPN: 10.0000 [IU] | PEN_INJECTOR | Freq: Every day | SUBCUTANEOUS | 0 refills | Status: DC

## 2023-04-28 DIAGNOSIS — M79641 Pain in right hand: Secondary | ICD-10-CM | POA: Diagnosis not present

## 2023-04-28 DIAGNOSIS — M2550 Pain in unspecified joint: Secondary | ICD-10-CM | POA: Diagnosis not present

## 2023-04-29 ENCOUNTER — Ambulatory Visit: Payer: Medicare Other | Admitting: Physician Assistant

## 2023-05-28 DIAGNOSIS — E1165 Type 2 diabetes mellitus with hyperglycemia: Secondary | ICD-10-CM | POA: Diagnosis not present

## 2023-06-08 ENCOUNTER — Other Ambulatory Visit: Payer: Self-pay | Admitting: Physician Assistant

## 2023-06-08 NOTE — Telephone Encounter (Signed)
Has an appt on 06/25/2023, no further refills until patient is seen

## 2023-06-21 ENCOUNTER — Ambulatory Visit: Payer: Medicare Other | Admitting: Internal Medicine

## 2023-06-22 ENCOUNTER — Ambulatory Visit: Payer: Medicare Other | Admitting: Physician Assistant

## 2023-06-24 DIAGNOSIS — M791 Myalgia, unspecified site: Secondary | ICD-10-CM | POA: Diagnosis not present

## 2023-06-24 DIAGNOSIS — E782 Mixed hyperlipidemia: Secondary | ICD-10-CM | POA: Diagnosis not present

## 2023-06-25 ENCOUNTER — Ambulatory Visit: Payer: Medicare Other | Admitting: Physician Assistant

## 2023-06-27 DIAGNOSIS — E1165 Type 2 diabetes mellitus with hyperglycemia: Secondary | ICD-10-CM | POA: Diagnosis not present

## 2023-07-02 ENCOUNTER — Ambulatory Visit: Payer: Medicare Other | Admitting: Physician Assistant

## 2023-07-13 ENCOUNTER — Ambulatory Visit: Payer: Medicare Other | Admitting: Physician Assistant

## 2023-07-23 ENCOUNTER — Other Ambulatory Visit: Payer: Self-pay | Admitting: Physician Assistant

## 2023-07-28 ENCOUNTER — Ambulatory Visit: Payer: Medicare Other | Admitting: Physician Assistant

## 2023-07-28 ENCOUNTER — Encounter: Payer: Self-pay | Admitting: Physician Assistant

## 2023-07-28 VITALS — BP 118/73 | HR 86 | Resp 18 | Ht 59.0 in | Wt 112.0 lb

## 2023-07-28 DIAGNOSIS — L089 Local infection of the skin and subcutaneous tissue, unspecified: Secondary | ICD-10-CM | POA: Diagnosis not present

## 2023-07-28 DIAGNOSIS — G300 Alzheimer's disease with early onset: Secondary | ICD-10-CM | POA: Diagnosis not present

## 2023-07-28 DIAGNOSIS — E1165 Type 2 diabetes mellitus with hyperglycemia: Secondary | ICD-10-CM | POA: Diagnosis not present

## 2023-07-28 DIAGNOSIS — G3184 Mild cognitive impairment, so stated: Secondary | ICD-10-CM

## 2023-07-28 MED ORDER — DONEPEZIL HCL 10 MG PO TABS: ORAL_TABLET | ORAL | 3 refills | Status: DC

## 2023-07-28 MED ORDER — MEMANTINE HCL 10 MG PO TABS: ORAL_TABLET | ORAL | 3 refills | Status: DC

## 2023-07-28 NOTE — Progress Notes (Signed)
Assessment/Plan:   Mild cognitive impairment, amnestic, likely due to Alzheimer's disease  Haley Bartlett is a very pleasant 72 y.o. RH female with a history of diabetes, neuropathy, chronic pain,  B12 deficiency, anxiety,  MCI likely due to Alzheimer's disease, early onset presenting today in follow-up for evaluation of memory loss. Patient is on donepezil 10 mg daily and memantine 10 mg twice daily.  Today symptoms he is 28/30.  Memory is stable     Recommendations:   Follow up in 6  months. Continue donepezil 10 mg daily and memantine 10 mg twice daily, side effects discussed Recommend good control of cardiovascular risk factors Continue to control mood as per PCP    Subjective:   This patient is accompanied in the office by her husband  who supplements the history. Previous records as well as any outside records available were reviewed prior to todays visit.   Patient was last seen on 10/14/2022 with an MMSE of 28/30.      Any changes in memory since last visit? "About the same"  Some good and bad days ".  She may forget some conversations and names of people, depending on the sugar levels  repeats oneself?  Endorsed Disoriented when walking into a room?  Patient denies  Leaving objects in unusual places?  Patient denies, although she may misplace her phone glucose sensor frequently  Wandering behavior?   If they go shopping and she walks faster and her husband has to search for her. Any personality changes since last visit?   denies   Any worsening depression?: denies.  Depression is well-controlled with medications, she is less tearful than before. Hallucinations or paranoia?  denies   Seizures?   denies    Any sleep changes? Sleeps well. Takes sleep aid . Denies vivid dreams, REM behavior or sleepwalking   Sleep apnea?   denies    Any hygiene concerns?   denies   Independent of bathing and dressing?  Endorsed  Does the patient needs help with medications?  Husband  is in charge   Who is in charge of the finances?  Husband is in charge     Any changes in appetite?  denies     Patient have trouble swallowing?  denies   Does the patient cook?  Yes  any kitchen accidents such as leaving the stove on?   denies   Any headaches?    denies   Vision changes? denies Chronic pain?  denies   Ambulates with difficulty?    denies   Recent falls or head injuries?    denies      Unilateral weakness, numbness or tingling?   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  denies   Any anosmia?    denies   Any incontinence of urine?  Goes frequently due to DM  Any bowel dysfunction?  denies      Patient lives with her husband  Does the patient drive? Yes, denies any issues.   MRI of the brain on 04/18/2022, personally reviewed only remarkable for mild chronic small vessel ischemic disease, mild cerebral atrophy, within normal limits for age without acute intracranial abnormalities.           History on Initial Assessment 06/30/2021: This is a 72 year old right-handed woman with a history of diabetes, neuropathy, B12 deficiency, anxiety, presenting for evaluation of memory loss. She was previously evaluated by neurologist Dr. Lucia Gaskins in 2017 for similar concerns. She has a  family history of Alzheimer's disease in her sister who presented at age 17 and died at age 14. At that time, her husband reported noticing memory changes starting around 2014, she was getting lost driving, forgetting names of coworkers she had worked with for many years. She was repeating herself, losing things, forgetting things on the stove or ingredients while cooking. At that time, she endorsed depression, crying most days. She had a brain MRI without contrast in 11/2016 which I personally reviewed, no acute changes, brain volume normal for age, mild chronic microvascular disease.   She states her memory is "bad." She lives with her husband and Haley Bartlett. Haley Bartlett reports the memory  changes in 2017 were "a little, not too bad," but have progressively gotten worse over the years. This morning she lost her coffee cup and could not recall if she drank coffee or where she put her cup. She is very forgetful. She has left the stove on. She drives very minimally, her family drives her places. She has always been bad with directions, but even when she crosses the street she does not look around, worse recently. She manages her own medications, Haley Bartlett checks behind and states she does pretty good with this. Her husband manages finances. She misplaces things frequently. Haley Bartlett notes sometimes she seems lost, there is a delay when answering questions like she has a hard time processing information. She had a hearing evaluation and it was not so bad. She used to sew really well, sewing wedding dresses, and now is unable to. She babysits her 4yo and 9yo grandchildren. She is also moody and argues with her husband, believing things a certain way and very adamant about it even if she is wrong. She understands conversations to be different. No hallucinations. She states she has not been feeling well lately because of headaches. Headaches started a year ago, with pain in the back of her head occurring once a week. There is no nausea/vomiting, she does not take prn medication. She denies any diplopia, dysarthria/dysphagia, bowel dysfunction. She has neuropathy in her hands and feet and takes gabapentin 100mg  qhs. She has some urinary leakage when she does her walking. Sense of taste and smell are not good. She has occasional hand tremors when eating and writing. She does not sleep well, usually with 4-5 hours of sleep. She does not take naps. She had a fall in April and May with a lot of leg cramps. She reports her glucose levels go up and down, especially at night. Her paternal grandmother had Alzheimer's disease, as well as her older sister as noted above. No history of significant head injuries or alcohol  use.       Past Medical History:  Diagnosis Date   Depression      Past Surgical History:  Procedure Laterality Date   ABDOMINAL HYSTERECTOMY     APPENDECTOMY     THYROIDECTOMY       PREVIOUS MEDICATIONS:   CURRENT MEDICATIONS:  Outpatient Encounter Medications as of 07/28/2023  Medication Sig   Accu-Chek Softclix Lancets lancets Use as instructed 1x a day   dorzolamide-timolol (COSOPT) 22.3-6.8 MG/ML ophthalmic solution Place 1 drop into both eyes 2 (two) times daily.   gabapentin (NEURONTIN) 100 MG capsule Take 100 mg by mouth at bedtime.   Glucagon 3 MG/DOSE POWD Place 3 mg into the nose once as needed for up to 1 dose.   glucose blood (ACCU-CHEK GUIDE) test strip 1 each by Other route 2 (two) times daily. And  lancets 2/day   HUMALOG MIX 75/25 (75-25) 100 UNIT/ML SUSP injection Inject 10-12 Units into the skin daily before supper.   insulin degludec (TRESIBA FLEXTOUCH) 100 UNIT/ML FlexTouch Pen Inject 10 Units into the skin daily.   Insulin Disposable Pump (OMNIPOD DASH INTRO, GEN 4,) KIT Use as instructed to administer insulin   Insulin Disposable Pump (OMNIPOD DASH PDM, GEN 4,) KIT Use as instructed.   Insulin Disposable Pump (OMNIPOD DASH PODS, GEN 4,) MISC Use as instructed to administer insulin. Change every 3 days.   insulin lispro (HUMALOG KWIKPEN) 100 UNIT/ML KwikPen Inject 4-8 Units into the skin 3 (three) times daily before meals.   Insulin Pen Needle 32G X 4 MM MISC Use 4x a day   latanoprost (XALATAN) 0.005 % ophthalmic solution Place 1 drop into both eyes at bedtime.   levothyroxine (SYNTHROID, LEVOTHROID) 25 MCG tablet Take 25 mcg by mouth daily before breakfast.   sertraline (ZOLOFT) 50 MG tablet Take 50 mg by mouth in the morning and at bedtime.   traZODone (DESYREL) 100 MG tablet Take 100 mg by mouth at bedtime.   [DISCONTINUED] donepezil (ARICEPT) 10 MG tablet TOME 1 TABLETA POR LA BOCA CADA  DIA   [DISCONTINUED] memantine (NAMENDA) 10 MG tablet TOME 1  TABLETA POR LA BOCA CADA  NOCHE POR 2 SEMANAS LUEGO 1  TABLETA POR LA BOCA DOS VECES AL DIA   donepezil (ARICEPT) 10 MG tablet TOME 1 TABLETA POR LA BOCA CADA  DIA   memantine (NAMENDA) 10 MG tablet TOME 1 TABLETA POR LA BOCA CADA  NOCHE POR 2 SEMANAS LUEGO 1  TABLETA POR LA BOCA DOS VECES AL DIA   No facility-administered encounter medications on file as of 07/28/2023.     Objective:     PHYSICAL EXAMINATION:    VITALS:   Vitals:   07/28/23 1135  BP: 118/73  Pulse: 86  Resp: 18  SpO2: 97%  Weight: 112 lb (50.8 kg)  Height: 4\' 11"  (1.499 m)    GEN:  The patient appears stated age and is in NAD. HEENT:  Normocephalic, atraumatic.   Neurological examination:  General: NAD, well-groomed, appears stated age. Orientation: The patient is alert. Oriented to person, place and date Cranial nerves: There is good facial symmetry.The speech is fluent and clear. No aphasia or dysarthria. Fund of knowledge is appropriate. Recent memory impaired and remote memory is normal.  Attention and concentration are normal.  Able to name objects and repeat phrases.  Hearing is intact to conversational tone . Delayed recall 2/3 Sensation: Sensation is intact to light touch throughout Motor: Strength is at least antigravity x4. DTR's 2/4 in UE/LE      06/30/2021   11:00 AM  Montreal Cognitive Assessment   Visuospatial/ Executive (0/5) 1  Naming (0/3) 1  Attention: Read list of digits (0/2) 0  Attention: Read list of letters (0/1) 0  Attention: Serial 7 subtraction starting at 100 (0/3) 1  Language: Repeat phrase (0/2) 0  Language : Fluency (0/1) 0  Abstraction (0/2) 1  Delayed Recall (0/5) 0  Orientation (0/6) 5  Total 9  Adjusted Score (based on education) 10       07/28/2023   12:00 PM 10/14/2022    2:00 PM 10/23/2016    9:11 AM  MMSE - Mini Mental State Exam  Orientation to time 4 5 4   Orientation to Place 5 5 3   Registration 3 3 3   Attention/ Calculation 5 4 5   Recall 2 2 3  Language- name 2 objects 2 2 2   Language- repeat 1 1 1   Language- follow 3 step command 3 3 3   Language- read & follow direction 1 1 1   Write a sentence 1 1 1   Copy design 1 1 1   Total score 28 28 27        Movement examination: Tone: There is normal tone in the UE/LE Abnormal movements:  no tremor.  No myoclonus.  No asterixis.   Coordination:  There is no decremation with RAM's. Normal finger to nose  Gait and Station: The patient has no difficulty arising out of a deep-seated chair without the use of the hands. The patient's stride length is good.  Gait is cautious and narrow.   Thank you for allowing [place] the opportunity to participate in the care of this nice patient. Please do not hesitate to contact us for any questions or concerns.   Total time spent on today's visit was 27 minutes dedicated to this patient today, preparing to see patient, examining the patient, ordering tests and/or medications and counseling the patient, documenting clinical information in the EHR or other health record, independently interpreting results and communicating results to the patient/family, discussing treatment and goals, answering patient's questions and coordinating care.  Cc:  Jarrett Soho, PA-C  Marlowe Kays 07/28/2023 12:30 PM

## 2023-07-28 NOTE — Patient Instructions (Addendum)
It was a pleasure to see you today at our office.   Recommendations:  Follow up  [place] Feb 26 a las 11:30  Continue donepezil 10 mg daily  Continue Memantine 10 mg twice daily  Monitor the sugars    Whom to call:  Memory  decline, memory medications: Call our office [phone]   For psychiatric meds, mood meds: Please have your primary care physician manage these medications.   Counseling regarding caregiver distress, including caregiver depression, anxiety and issues regarding community resources, adult day care programs, adult living facilities, or memory care questions:   Feel free to contact Misty Lisabeth Register, Social Worker at [phone]   For assessment of decision of mental capacity and competency:  Call Dr. Erick Blinks, geriatric psychiatrist at [phone]  For guidance in geriatric dementia issues please call [facility] [phone]  For guidance regarding [facility] and if placement were needed at the facility, contact Sidney Ace, Social Worker tel: [phone]  If you have any severe symptoms of a stroke, or other severe issues such as confusion,severe chills or fever, etc call 911 or go to the ER as you may need to be evaluated further   Feel free to visit Facebook page " [email or web address]" for tips of how to care for people with memory problems.       RECOMMENDATIONS FOR ALL PATIENTS WITH MEMORY PROBLEMS: 1. Continue to exercise (Recommend 30 minutes of walking everyday, or 3 hours every week) 2. Increase social interactions - continue going to [place] and enjoy social gatherings with friends and family 3. Eat healthy, avoid fried foods and eat more fruits and vegetables 4. Maintain adequate blood pressure, blood sugar, and blood cholesterol level. Reducing the risk of stroke and cardiovascular disease also helps promoting better memory. 5. Avoid stressful situations. Live a simple life and avoid aggravations. Organize your  time and prepare for the next day in anticipation. 6. Sleep well, avoid any interruptions of sleep and avoid any distractions in the bedroom that may interfere with adequate sleep quality 7. Avoid sugar, avoid sweets as there is a strong link between excessive sugar intake, diabetes, and cognitive impairment We discussed the Mediterranean diet, which has been shown to help patients reduce the risk of progressive memory disorders and reduces cardiovascular risk. This includes eating fish, eat fruits and green leafy vegetables, nuts like almonds and hazelnuts, walnuts, and also use olive oil. Avoid fast foods and fried foods as much as possible. Avoid sweets and sugar as sugar use has been linked to worsening of memory function.  There is always a concern of gradual progression of memory problems. If this is the case, then we may need to adjust level of care according to patient needs. Support, both to the patient and caregiver, should then be put into place.    The [employer] Association is here all day, every day for people facing Alzheimer's disease through our free 24/7 Helpline: [phone]. The Helpline provides reliable information and support to all those who need assistance, such as individuals living with memory loss, Alzheimer's or other dementia, caregivers, health care professionals and the public.  Our highly trained and knowledgeable staff can help you with: Understanding memory loss, dementia and Alzheimer's  Medications and other treatment options  General information about aging and brain health  Skills to provide quality care and to find the best care from professionals  Legal, financial and living-arrangement decisions Our Helpline also features: Confidential care consultation provided by [facility] level clinicians  who can help with decision-making support, crisis assistance and education on issues families face every day  Help in a caller's preferred language using our  translation service that features more than 200 languages and dialects  Referrals to local community programs, services and ongoing support     FALL PRECAUTIONS: Be cautious when walking. Scan the area for obstacles that may increase the risk of trips and falls. When getting up in the mornings, sit up at the edge of the bed for a few minutes before getting out of bed. Consider elevating the bed at the head end to avoid drop of blood pressure when getting up. Walk always in a well-lit room (use night lights in the walls). Avoid area rugs or power cords from appliances in the middle of the walkways. Use a walker or a cane if necessary and consider physical therapy for balance exercise. Get your eyesight checked regularly.  FINANCIAL OVERSIGHT: Supervision, especially oversight when making financial decisions or transactions is also recommended.  HOME SAFETY: Consider the safety of the kitchen when operating appliances like stoves, microwave oven, and blender. Consider having supervision and share cooking responsibilities until no longer able to participate in those. Accidents with firearms and other hazards in the house should be identified and addressed as well.   ABILITY TO BE LEFT ALONE: If patient is unable to contact 911 operator, consider using LifeLine, or when the need is there, arrange for someone to stay with patients. Smoking is a fire hazard, consider supervision or cessation. Risk of wandering should be assessed by caregiver and if detected at any point, supervision and safe proof recommendations should be instituted.  MEDICATION SUPERVISION: Inability to self-administer medication needs to be constantly addressed. Implement a mechanism to ensure safe administration of the medications.   DRIVING: Regarding driving, in patients with progressive memory problems, driving will be impaired. We advise to have someone else do the driving if trouble finding directions or if minor accidents are  reported. Independent driving assessment is available to determine safety of driving.   If you are interested in the driving assessment, you can contact the following:  [facility] [employer] [facility] in [place] [phone]  [facility] [phone]  [facility] [phone] or [phone]      Mediterranean Diet A Mediterranean diet refers to food and lifestyle choices that are based on the traditions of countries located on the [employer]. This way of eating has been shown to help prevent certain conditions and improve outcomes for people who have chronic diseases, like kidney disease and heart disease. What are tips for following this plan? Lifestyle  Cook and eat meals together with your family, when possible. Drink enough fluid to keep your urine clear or pale yellow. Be physically active every day. This includes: Aerobic exercise like running or swimming. Leisure activities like gardening, walking, or housework. Get 7-8 hours of sleep each night. If recommended by your health care provider, drink red wine in moderation. This means 1 glass a day for nonpregnant women and 2 glasses a day for men. A glass of wine equals 5 oz (150 mL). Reading food labels  Check the serving size of packaged foods. For foods such as rice and pasta, the serving size refers to the amount of cooked product, not dry. Check the total fat in packaged foods. Avoid foods that have saturated fat or trans fats. Check the ingredients list for added sugars, such as corn syrup. Shopping  At the grocery  store, buy most of your food from the areas near the walls of the store. This includes: Fresh fruits and vegetables (produce). Grains, beans, nuts, and seeds. Some of these may be available in unpackaged forms or large amounts (in bulk). Fresh seafood. Poultry and eggs. Low-fat dairy products. Buy whole ingredients instead of prepackaged  foods. Buy fresh fruits and vegetables in-season from local farmers markets. Buy frozen fruits and vegetables in resealable bags. If you do not have access to quality fresh seafood, buy precooked frozen shrimp or canned fish, such as tuna, salmon, or sardines. Buy small amounts of raw or cooked vegetables, salads, or olives from the deli or salad bar at your store. Stock your pantry so you always have certain foods on hand, such as olive oil, canned tuna, canned tomatoes, rice, pasta, and beans. Cooking  Cook foods with extra-virgin olive oil instead of using butter or other vegetable oils. Have meat as a side dish, and have vegetables or grains as your main dish. This means having meat in small portions or adding small amounts of meat to foods like pasta or stew. Use beans or vegetables instead of meat in common dishes like chili or lasagna. Experiment with different cooking methods. Try roasting or broiling vegetables instead of steaming or sauteing them. Add frozen vegetables to soups, stews, pasta, or rice. Add nuts or seeds for added healthy fat at each meal. You can add these to yogurt, salads, or vegetable dishes. Marinate fish or vegetables using olive oil, lemon juice, garlic, and fresh herbs. Meal planning  Plan to eat 1 vegetarian meal one day each week. Try to work up to 2 vegetarian meals, if possible. Eat seafood 2 or more times a week. Have healthy snacks readily available, such as: Vegetable sticks with hummus. Greek yogurt. Fruit and nut trail mix. Eat balanced meals throughout the week. This includes: Fruit: 2-3 servings a day Vegetables: 4-5 servings a day Low-fat dairy: 2 servings a day Fish, poultry, or lean meat: 1 serving a day Beans and legumes: 2 or more servings a week Nuts and seeds: 1-2 servings a day Whole grains: 6-8 servings a day Extra-virgin olive oil: 3-4 servings a day Limit red meat and sweets to only a few servings a month What are my food  choices? Mediterranean diet Recommended Grains: Whole-grain pasta. Brown rice. Bulgar wheat. Polenta. Couscous. Whole-wheat bread. Orpah Cobb. Vegetables: Artichokes. Beets. Broccoli. Cabbage. Carrots. Eggplant. Green beans. Chard. Kale. Spinach. Onions. Leeks. Peas. Squash. Tomatoes. Peppers. Radishes. Fruits: Apples. Apricots. Avocado. Berries. Bananas. Cherries. Dates. Figs. Grapes. Lemons. Melon. Oranges. Peaches. Plums. Pomegranate. Meats and other protein foods: Beans. Almonds. Sunflower seeds. Pine nuts. Peanuts. Cod. Salmon. Scallops. Shrimp. Tuna. Tilapia. Clams. Oysters. Eggs. Dairy: Low-fat milk. Cheese. Greek yogurt. Beverages: Water. Red wine. Herbal tea. Fats and oils: Extra virgin olive oil. Avocado oil. Grape seed oil. Sweets and desserts: [place] yogurt with honey. Baked apples. Poached pears. Trail mix. Seasoning and other foods: Basil. Cilantro. Coriander. Cumin. Mint. Parsley. Sage. Rosemary. Tarragon. Garlic. Oregano. Thyme. Pepper. Balsalmic vinegar. Tahini. Hummus. Tomato sauce. Olives. Mushrooms. Limit these Grains: Prepackaged pasta or rice dishes. Prepackaged cereal with added sugar. Vegetables: Deep fried potatoes (french fries). Fruits: Fruit canned in syrup. Meats and other protein foods: Beef. Pork. Lamb. Poultry with skin. Hot dogs. Tomasa Blase. Dairy: Ice cream. Sour cream. Whole milk. Beverages: Juice. Sugar-sweetened soft drinks. Beer. Liquor and spirits. Fats and oils: Butter. Canola oil. Vegetable oil. Beef fat (tallow). Lard. Sweets and desserts: Cookies. Cakes. Pies. Candy.  Seasoning and other foods: Mayonnaise. Premade sauces and marinades. The items listed may not be a complete list. Talk with your dietitian about what dietary choices are right for you. Summary The Mediterranean diet includes both food and lifestyle choices. Eat a variety of fresh fruits and vegetables, beans, nuts, seeds, and whole grains. Limit the amount of red meat and sweets that  you eat. Talk with your health care provider about whether it is safe for you to drink red wine in moderation. This means 1 glass a day for nonpregnant women and 2 glasses a day for men. A glass of wine equals 5 oz (150 mL). This information is not intended to replace advice given to you by your health care provider. Make sure you discuss any questions you have with your health care provider. Document Released: 07/16/2016 Document Revised: 08/18/2016 Document Reviewed: 07/16/2016 Elsevier Interactive Patient Education  2017 [employer].

## 2023-08-05 DIAGNOSIS — L089 Local infection of the skin and subcutaneous tissue, unspecified: Secondary | ICD-10-CM | POA: Diagnosis not present

## 2023-08-05 DIAGNOSIS — R350 Frequency of micturition: Secondary | ICD-10-CM | POA: Diagnosis not present

## 2023-08-06 ENCOUNTER — Ambulatory Visit: Payer: Medicare Other | Admitting: Internal Medicine

## 2023-08-06 ENCOUNTER — Encounter: Payer: Self-pay | Admitting: Internal Medicine

## 2023-08-06 VITALS — BP 120/70 | HR 84 | Ht 59.0 in | Wt 113.6 lb

## 2023-08-06 DIAGNOSIS — E119 Type 2 diabetes mellitus without complications: Secondary | ICD-10-CM

## 2023-08-06 DIAGNOSIS — E11319 Type 2 diabetes mellitus with unspecified diabetic retinopathy without macular edema: Secondary | ICD-10-CM | POA: Diagnosis not present

## 2023-08-06 DIAGNOSIS — E039 Hypothyroidism, unspecified: Secondary | ICD-10-CM | POA: Diagnosis not present

## 2023-08-06 DIAGNOSIS — E785 Hyperlipidemia, unspecified: Secondary | ICD-10-CM | POA: Diagnosis not present

## 2023-08-06 DIAGNOSIS — Z794 Long term (current) use of insulin: Secondary | ICD-10-CM

## 2023-08-06 LAB — POCT GLYCOSYLATED HEMOGLOBIN (HGB A1C): Hemoglobin A1C: 8.3 % — AB (ref 4.0–5.6)

## 2023-08-06 MED ORDER — TRESIBA FLEXTOUCH 100 UNIT/ML ~~LOC~~ SOPN: 10.0000 [IU] | PEN_INJECTOR | Freq: Every day | SUBCUTANEOUS | 3 refills | Status: DC

## 2023-08-06 NOTE — Progress Notes (Signed)
Patient ID: Haley Bartlett, female   DOB: [birth year], 72 y.o.   MRN: [id number]  HPI: Haley Bartlett is a 72 y.o.-year-old female, returning for follow-up for DM2, dx in 1993, on insulin since 2002, uncontrolled, with complications (DR).  She previously saw Dr. Everardo All, last visit with me 6 months ago. She is accompanied by her husband who translates for [place].   Interim history: No increased urination, blurry vision, nausea, chest pain. She did have a recent abscess developed at the site of an abdominal insulin injection -was on antibiotics.  Reviewed HbA1c: 01/15/2023: HbA1c 7.9% 07/30/2022: HbA1c 7.7% Lab Results  Component Value Date   HGBA1C 8.6 (A) 04/17/2022   HGBA1C 8.5 (A) 01/08/2022   HGBA1C 8.2 (A) 09/30/2021   HGBA1C 7.8 (A) 08/26/2021   HGBA1C 6.9 (A) 04/22/2021   HGBA1C 6.9 (A) 02/18/2021  03/26/2022: HbA1c 9.0%, C-peptide 1.6, glucose 217 08/13/2010: GAD antibody <1, Glu 164, C peptide 1.1  She was previously on: - Metformin 500 mg 2x a day - Prandin 1 mg 3 times a day before meals - Actos 30 mg before breakfast - Farxiga 10 mg before breakfast - Humalog 11 units before the 3 meals  - only if sugars >300 She was taken off insulin but restarted 01/2022. Januvia and Rybelsus were too expensive.  At last visit she was not taking the recommended regimen: - Metformin 500 mg 2x a day >> not taken since last OV 2/2 UTIs - Tresiba 10 >> she decreased to 6 units daily 2/2 lows - Humalog 4-5  units 15 min before each meal and increase the dose as needed >> 6 units after dinner  At last visit I recommended the following regimen: - Tresiba 6 units daily >> 10-12 >> 10 units daily >> but using only 5 units of Tresiba - Humalog 7-8 units 3-4x a day >> 10 min before EACH MEAL 9 units before b'fast >> but still using 8 units 4 units before lunch >> still not injecting 4-5 units before dinner >> but still using 10 units  Pt checks her sugars >4x a day and they are -with  receiver:  Previously:   Lowest sugar was 120 >> 67 >> 53; she has hypoglycemia awareness at 72.  Highest sugar was HI >> 400 >> 400s. She was not emergency room with hypoglycemia (09/2022).  She was found at home on the floor, by her daughter. She took her insulin, but did not eat lunch, which is unusual for her.  Glucose was 26 on Dexcom.  She did not hear the alarm.  It was noted that she was using higher doses of insulin than recommended around the time of the episode.  Glucometer: Accu-Chek guide  - last BUN/creatinine:  Lab Results  Component Value Date   BUN 12 09/24/2022   BUN 9 04/18/2022   CREATININE 0.83 09/24/2022   CREATININE 0.80 04/18/2022   No results found for: "MICRALBCREAT" She is not on ACE inhibitor/ARB.  -+ HL; last set of lipids: Lipid Panel[303756]     Collected: 03/26/2022 04:20 PM     Specimen Received: 03/26/2022 05:00 AM       Cholesterol, Total [001065] 247 mg/dL H 253-664 mg/dL  Triglycerides [403474] 85 mg/dL Normal 2-595 mg/dL  HDL Cholesterol [638756] 104 mg/dL Normal >43 mg/dL  VLDL Cholesterol Cal [329518] 14 mg/dL Normal 8-41 mg/dL  LDL Chol Calc (NIH) [660630] 129 mg/dL H 1-60 mg/dL  No results found for: "CHOL", "HDL", "LDLCALC", "LDLDIRECT", "TRIG", "CHOLHDL" She is  intolerant to statins.  - last eye exam was in 01/2023: + DR reportedly.   - + numbness and tingling in her feet.  Last foot exam 01/15/2023.  She is on Neurontin 100 mg at bedtime.  She also has hypothyroidism - on levothyroxine 25 mcg daily, taken: - in am - fasting - at least 1h from b'fast - no calcium - no iron - prev. + multivitamins at lunchtime, now stopped - no PPIs - not on Biotin  Reviewed TSH levels: Lab Results  Component Value Date   TSH 1.615 09/24/2022  03/17/2022: TSH 2.0  ROS: + See HPI  Past Medical History:  Diagnosis Date   Depression    Past Surgical History:  Procedure Laterality Date   ABDOMINAL HYSTERECTOMY     APPENDECTOMY      THYROIDECTOMY     Social History   Socioeconomic History   Marital status: Married    Spouse name: Geographical information systems officer   Number of children: 2   Years of education: 12   Highest education level: Not on file  Occupational History   Occupation: retired  Tobacco Use   Smoking status: Never   Smokeless tobacco: Never  Vaping Use   Vaping status: Never Used  Substance and Sexual Activity   Alcohol use: No   Drug use: No   Sexual activity: Not on file  Other Topics Concern   Not on file  Social History Narrative   Lives with Oswaldo Done, husband   Caffeine use: Coffee daily   Right handed    Social Determinants of Health   Financial Resource Strain: Not on file  Food Insecurity: Not on file  Transportation Needs: Not on file  Physical Activity: Not on file  Stress: Not on file  Social Connections: Not on file  Intimate Partner Violence: Not on file   Current Outpatient Medications on File Prior to Visit  Medication Sig Dispense Refill   Accu-Chek Softclix Lancets lancets Use as instructed 1x a day 100 each 3   donepezil (ARICEPT) 10 MG tablet TOME 1 TABLETA POR LA BOCA CADA  DIA 90 tablet 3   dorzolamide-timolol (COSOPT) 22.3-6.8 MG/ML ophthalmic solution Place 1 drop into both eyes 2 (two) times daily.     gabapentin (NEURONTIN) 100 MG capsule Take 100 mg by mouth at bedtime.     Glucagon 3 MG/DOSE POWD Place 3 mg into the nose once as needed for up to 1 dose. 2 each 11   glucose blood (ACCU-CHEK GUIDE) test strip 1 each by Other route 2 (two) times daily. And lancets 2/day 200 each 3   HUMALOG MIX 75/25 (75-25) 100 UNIT/ML SUSP injection Inject 10-12 Units into the skin daily before supper.     insulin degludec (TRESIBA FLEXTOUCH) 100 UNIT/ML FlexTouch Pen Inject 10 Units into the skin daily. 3 mL 0   Insulin Disposable Pump (OMNIPOD DASH INTRO, GEN 4,) KIT Use as instructed to administer insulin 1 kit 0   Insulin Disposable Pump (OMNIPOD DASH PDM, GEN 4,) KIT Use as instructed. 1 kit 0    Insulin Disposable Pump (OMNIPOD DASH PODS, GEN 4,) MISC Use as instructed to administer insulin. Change every 3 days. 30 each 3   insulin lispro (HUMALOG KWIKPEN) 100 UNIT/ML KwikPen Inject 4-8 Units into the skin 3 (three) times daily before meals. 30 mL 3   Insulin Pen Needle 32G X 4 MM MISC Use 4x a day 300 each 3   latanoprost (XALATAN) 0.005 % ophthalmic solution Place 1 drop into  both eyes at bedtime.     levothyroxine (SYNTHROID, LEVOTHROID) 25 MCG tablet Take 25 mcg by mouth daily before breakfast.     memantine (NAMENDA) 10 MG tablet TOME 1 TABLETA POR LA BOCA CADA  NOCHE POR 2 SEMANAS LUEGO 1  TABLETA POR LA BOCA DOS VECES AL DIA 180 tablet 3   sertraline (ZOLOFT) 50 MG tablet Take 50 mg by mouth in the morning and at bedtime.     traZODone (DESYREL) 100 MG tablet Take 100 mg by mouth at bedtime.     No current facility-administered medications on file prior to visit.   Allergies  Allergen Reactions   Atorvastatin Other (See Comments)    Reaction??   Other Rash and Other (See Comments)    A wrinkle cream (name not recalled) broke out the chest and face   Family History  Problem Relation Age of Onset   Dementia Sister    Diabetes Sister    Diabetes Mother    Diabetes Brother    PE: There were no vitals taken for this visit. Wt Readings from Last 3 Encounters:  07/28/23 112 lb (50.8 kg)  01/15/23 114 lb 2 oz (51.8 kg)  10/14/22 109 lb (49.4 kg)   Constitutional: thin, in NAD Eyes: EOMI, no exophthalmos ENT: no thyromegaly, no cervical lymphadenopathy Cardiovascular: RRR, No MRG Respiratory: CTA B Musculoskeletal: no deformities Skin: warm, no rashes Neurological: no tremor with outstretched hands  ASSESSMENT: 1. DM2, insulin-dependent, uncontrolled, with complications - DR  Component     Latest Ref Rng 08/20/2022  Glucose     65 - 99 mg/dL 865 (H)   Islet Cell Ab     Neg:<1:1  Negative   C-Peptide     0.80 - 3.85 ng/mL 1.04   ZNT8 Antibodies     <15 U/mL  <10   Glutamic Acid Decarb Ab     <5 IU/mL <5   Glucose is too high to thoroughly interpret the C-peptide, however, it is encouraging that this is not suppressed.  The antipancreatic antibodies are not elevated.  2. HL  3.  Hypothyroidism  PLAN:  1. Patient with longstanding, uncontrolled, diabetes, on basal/bolus insulin regimen, with history of noncompliance with recommended regimen.  She has early Alzheimer's dementia.  We previously checked her for insulin deficiency and antipancreatic autoimmunity and the investigation was negative.  At last visit, reviewing the CGM trends, sugars appears to be very high throughout the day, increasing significantly after breakfast and staying elevated until approximately 6 PM, when they were dropping abruptly and then increased afterwards.  From what I could understand from the patient, she was taking approximately 8 units of Humalog before breakfast and 8 before dinner.  She was waiting 20 minutes between her insulin injection and meals and I advised her to reduce this to 10 minutes.  I did advise her to increase her Humalog dose before breakfast and to reduce the dose before dinner to avoid abrupt drops after this meal.  She was not having a formal lunch but she was eating a snack with carbs so I advised her to take a smaller meal before this meal.  We did not change the rest of the regimen.  HbA1c at that time was 7.9%, higher. CGM interpretation: -At today's visit, we reviewed her CGM downloads: It appears that 22% of values are in target range (goal >70%), while 78% are higher than 180 (goal <25%), and 0% are lower than 70 (goal <4%).  The calculated average blood  sugar is 231.  The projected HbA1c for the next 3 months (GMI) is 8.8%. -Reviewing the CGM trends, sugars are extremely high throughout the day and night, peaking around 12 PM and improving slightly afterwards, but with another significant hyperglycemic peak around 9 PM.  Upon questioning, not only  did the patient not adopt the recommended changes that I made about the Humalog dosing at last visit, but she also decrease the [place] dose to half.  It is unclear why she reduced the dose of Tresiba, but she mentions that this was due to the fact that she was taking [place] close to Humalog.  We discussed that the timing of the injections does not affect the insulin doses.  She continues to have high blood sugars after breakfast, and also rapid drop in blood sugars after dinner, as she is still taking a high dose of Humalog before this meal.  She then tries to correct it with juice and sugars peak at bedtime.  At today's visit I again advised her to increase the dose of insulin before breakfast, to take some insulin before lunch as she is not wearing this meal, and reduce the dose of Humalog before dinner.  I advised her to increase Tresiba back to 10 units daily. -Patient's husband inquires about an OmniPod pump.  I explained that she is not a candidate for the pump due to the intricacies of the pump management.  Discussed about possible risks. - I suggested to:  Patient Instructions  Please use the following regimen: - Tresiba 10 units daily - Humalog 15 min before EACH MEAL 9 units before b'fast 4 units before lunch 4-5 units before dinner  Continue Levothyroxine 25 mcg daily.  Take the thyroid hormone every day, with water, at least 30 minutes before breakfast, separated by at least 4 hours from: - acid reflux medications - calcium - iron - multivitamins  Please return in 3 months.  - we checked her HbA1c: 8.3% (higher) - advised to check sugars at different times of the day - 4x a day, rotating check times - advised for yearly eye exams >> she is UTD - return to clinic in 3-4 months  2. HL - Reviewed latest lipid panel from 03/2022: LDL above target, the rest of the fractions at goal No results found for: "CHOL", "HDL", "LDLCALC", "LDLDIRECT", "TRIG", "CHOLHDL" - she is intolerant  to statins -She apparently had labs including a lipid panel with PCP yesterday.  Will try to get these records.  3.  Hypothyroidism - latest thyroid labs reviewed with pt. >> normal: Lab Results  Component Value Date   TSH 1.615 09/24/2022  - she continues on LT4 25 mcg daily - pt feels good on this dose. - we discussed about taking the thyroid hormone every day, with water, >30 minutes before breakfast, separated by >4 hours from acid reflux medications, calcium, iron, multivitamins. Pt. is taking it correctly. -Will try to obtain the latest TSH from Haroldine Laws, MD PhD [facility]

## 2023-08-06 NOTE — Addendum Note (Signed)
Addended by: Carlus Pavlov on: 08/06/2023 12:13 PM   Modules accepted: Level of Service

## 2023-08-06 NOTE — Addendum Note (Signed)
Addended by: Pollie Meyer on: 08/06/2023 02:07 PM   Modules accepted: Orders

## 2023-08-06 NOTE — Patient Instructions (Addendum)
Please use the following regimen: - Tresiba 10 units daily - Humalog 15 min before EACH MEAL 9 units before b'fast 4 units before lunch 4-5 units before dinner  Continue Levothyroxine 25 mcg daily.  Take the thyroid hormone every day, with water, at least 30 minutes before breakfast, separated by at least 4 hours from: - acid reflux medications - calcium - iron - multivitamins  Please return in 3 months.

## 2023-08-10 DIAGNOSIS — L02211 Cutaneous abscess of abdominal wall: Secondary | ICD-10-CM | POA: Diagnosis not present

## 2023-08-10 DIAGNOSIS — E1165 Type 2 diabetes mellitus with hyperglycemia: Secondary | ICD-10-CM | POA: Diagnosis not present

## 2023-08-10 DIAGNOSIS — G3184 Mild cognitive impairment, so stated: Secondary | ICD-10-CM | POA: Diagnosis not present

## 2023-08-10 DIAGNOSIS — Z794 Long term (current) use of insulin: Secondary | ICD-10-CM | POA: Diagnosis not present

## 2023-08-10 DIAGNOSIS — E782 Mixed hyperlipidemia: Secondary | ICD-10-CM | POA: Diagnosis not present

## 2023-08-10 DIAGNOSIS — E1140 Type 2 diabetes mellitus with diabetic neuropathy, unspecified: Secondary | ICD-10-CM | POA: Diagnosis not present

## 2023-08-10 DIAGNOSIS — Z6823 Body mass index (BMI) 23.0-23.9, adult: Secondary | ICD-10-CM | POA: Diagnosis not present

## 2023-08-10 DIAGNOSIS — E538 Deficiency of other specified B group vitamins: Secondary | ICD-10-CM | POA: Diagnosis not present

## 2023-08-10 DIAGNOSIS — H401131 Primary open-angle glaucoma, bilateral, mild stage: Secondary | ICD-10-CM | POA: Diagnosis not present

## 2023-08-19 DIAGNOSIS — H40053 Ocular hypertension, bilateral: Secondary | ICD-10-CM | POA: Diagnosis not present

## 2023-09-06 DIAGNOSIS — E039 Hypothyroidism, unspecified: Secondary | ICD-10-CM | POA: Diagnosis not present

## 2023-09-06 DIAGNOSIS — E782 Mixed hyperlipidemia: Secondary | ICD-10-CM | POA: Diagnosis not present

## 2023-09-07 ENCOUNTER — Other Ambulatory Visit: Payer: Self-pay | Admitting: Student

## 2023-09-07 DIAGNOSIS — Z9889 Other specified postprocedural states: Secondary | ICD-10-CM | POA: Diagnosis not present

## 2023-09-07 DIAGNOSIS — L02211 Cutaneous abscess of abdominal wall: Secondary | ICD-10-CM

## 2023-09-14 ENCOUNTER — Ambulatory Visit
Admission: RE | Admit: 2023-09-14 | Discharge: 2023-09-14 | Disposition: A | Discharge status: Home / Self Care | Payer: Medicare Other | Source: Ambulatory Visit | Attending: Student

## 2023-09-14 DIAGNOSIS — L02211 Cutaneous abscess of abdominal wall: Secondary | ICD-10-CM

## 2023-09-14 DIAGNOSIS — I700 Atherosclerosis of aorta: Secondary | ICD-10-CM | POA: Diagnosis not present

## 2023-09-14 DIAGNOSIS — E785 Hyperlipidemia, unspecified: Secondary | ICD-10-CM | POA: Diagnosis not present

## 2023-09-14 MED ORDER — IOPAMIDOL (ISOVUE-300) INJECTION 61%
100.0000 mL | Freq: Once | INTRAVENOUS | Status: AC | PRN
  Administered 2023-09-14: 80 mL via INTRAVENOUS

## 2023-09-27 ENCOUNTER — Telehealth: Payer: Self-pay | Admitting: Internal Medicine

## 2023-09-27 ENCOUNTER — Other Ambulatory Visit: Payer: Self-pay

## 2023-09-27 DIAGNOSIS — E1165 Type 2 diabetes mellitus with hyperglycemia: Secondary | ICD-10-CM

## 2023-09-27 MED ORDER — DEXCOM G7 SENSOR MISC: 1.0000 | 0 refills | Status: AC

## 2023-09-27 MED ORDER — DEXCOM G7 SENSOR MISC: 1.0000 | 0 refills | Status: DC

## 2023-09-27 NOTE — Telephone Encounter (Signed)
Dexcom G7 sensors were sent to [facility] I tried to call the patient at the number listed and it is a fax machine

## 2023-09-27 NOTE — Telephone Encounter (Signed)
Dexcom 7 refill for sensor. Pleased advise when done.. to [phone]

## 2023-09-30 DIAGNOSIS — E1165 Type 2 diabetes mellitus with hyperglycemia: Secondary | ICD-10-CM | POA: Diagnosis not present

## 2023-10-12 DIAGNOSIS — Z1231 Encounter for screening mammogram for malignant neoplasm of breast: Secondary | ICD-10-CM | POA: Diagnosis not present

## 2023-10-25 DIAGNOSIS — E1165 Type 2 diabetes mellitus with hyperglycemia: Secondary | ICD-10-CM | POA: Diagnosis not present

## 2023-10-25 DIAGNOSIS — H4311 Vitreous hemorrhage, right eye: Secondary | ICD-10-CM | POA: Diagnosis not present

## 2023-11-07 ENCOUNTER — Emergency Department (HOSPITAL_COMMUNITY): Payer: Medicare Other

## 2023-11-07 ENCOUNTER — Other Ambulatory Visit: Payer: Self-pay

## 2023-11-07 ENCOUNTER — Emergency Department (HOSPITAL_COMMUNITY)
Admission: EM | Admit: 2023-11-07 | Discharge: 2023-11-07 | Disposition: A | Discharge status: Home / Self Care | Payer: Medicare Other | Attending: Emergency Medicine | Admitting: Emergency Medicine

## 2023-11-07 ENCOUNTER — Encounter (HOSPITAL_COMMUNITY): Payer: Self-pay

## 2023-11-07 DIAGNOSIS — S59901A Unspecified injury of right elbow, initial encounter: Secondary | ICD-10-CM | POA: Diagnosis not present

## 2023-11-07 DIAGNOSIS — S0033XA Contusion of nose, initial encounter: Secondary | ICD-10-CM | POA: Diagnosis not present

## 2023-11-07 DIAGNOSIS — E039 Hypothyroidism, unspecified: Secondary | ICD-10-CM | POA: Insufficient documentation

## 2023-11-07 DIAGNOSIS — E119 Type 2 diabetes mellitus without complications: Secondary | ICD-10-CM | POA: Insufficient documentation

## 2023-11-07 DIAGNOSIS — W19XXXA Unspecified fall, initial encounter: Secondary | ICD-10-CM

## 2023-11-07 DIAGNOSIS — S0993XA Unspecified injury of face, initial encounter: Secondary | ICD-10-CM | POA: Diagnosis not present

## 2023-11-07 DIAGNOSIS — Z794 Long term (current) use of insulin: Secondary | ICD-10-CM | POA: Diagnosis not present

## 2023-11-07 DIAGNOSIS — G9389 Other specified disorders of brain: Secondary | ICD-10-CM | POA: Diagnosis not present

## 2023-11-07 DIAGNOSIS — S50311A Abrasion of right elbow, initial encounter: Secondary | ICD-10-CM | POA: Insufficient documentation

## 2023-11-07 DIAGNOSIS — Z043 Encounter for examination and observation following other accident: Secondary | ICD-10-CM | POA: Diagnosis not present

## 2023-11-07 DIAGNOSIS — M4802 Spinal stenosis, cervical region: Secondary | ICD-10-CM | POA: Diagnosis not present

## 2023-11-07 DIAGNOSIS — H538 Other visual disturbances: Secondary | ICD-10-CM | POA: Insufficient documentation

## 2023-11-07 DIAGNOSIS — W01198A Fall on same level from slipping, tripping and stumbling with subsequent striking against other object, initial encounter: Secondary | ICD-10-CM | POA: Diagnosis not present

## 2023-11-07 DIAGNOSIS — S0992XA Unspecified injury of nose, initial encounter: Secondary | ICD-10-CM | POA: Diagnosis not present

## 2023-11-07 DIAGNOSIS — M4312 Spondylolisthesis, cervical region: Secondary | ICD-10-CM | POA: Diagnosis not present

## 2023-11-07 DIAGNOSIS — R519 Headache, unspecified: Secondary | ICD-10-CM | POA: Insufficient documentation

## 2023-11-07 NOTE — ED Provider Notes (Signed)
Haley Bartlett EMERGENCY DEPARTMENT AT [facility] Provider Note   CSN: [id number] Arrival date & time: 11/07/23  1853     History  Chief Complaint  Patient presents with   [facility] is a 72 y.o. female who presents to the ER complaining of facial injury after falling. She was walking and tripped, striking her face on the ground with gravel. Denies LOC. Complaining of headache, burning pain in her eyes, cloudy vision (which is improving), pain in her nose. Also has some small abrasions on her elbows.   The history is provided by the patient and a relative. The history is limited by a language barrier. A language interpreter was used.  Fall       Home Medications Prior to Admission medications   Medication Sig Start Date End Date Taking? Authorizing Provider  Accu-Chek Softclix Lancets lancets Use as instructed 1x a day 01/15/23   Carlus Pavlov, MD  Continuous Glucose Sensor (DEXCOM G7 SENSOR) MISC 1 Device by Does not apply route continuous. 09/27/23 12/26/23  Carlus Pavlov, MD  donepezil (ARICEPT) 10 MG tablet TOME 1 TABLETA POR LA BOCA CADA  DIA 07/28/23   Gwynneth Munson, Sung Amabile, PA-C  dorzolamide-timolol (COSOPT) 22.3-6.8 MG/ML ophthalmic solution Place 1 drop into both eyes 2 (two) times daily.    [provider]  gabapentin (NEURONTIN) 100 MG capsule Take 100 mg by mouth at bedtime.    [provider]  Glucagon 3 MG/DOSE POWD Place 3 mg into the nose once as needed for up to 1 dose. 02/08/23   Carlus Pavlov, MD  glucose blood (ACCU-CHEK GUIDE) test strip 1 each by Other route 2 (two) times daily. And lancets 2/day 02/18/21   Romero Belling, MD  HUMALOG MIX 75/25 (75-25) 100 UNIT/ML SUSP injection Inject 10-12 Units into the skin daily before supper. Patient not taking: Reported on 08/06/2023    [provider]  insulin degludec (TRESIBA FLEXTOUCH) 100 UNIT/ML FlexTouch Pen Inject 10 Units into the skin daily. 08/06/23    Carlus Pavlov, MD  Insulin Disposable Pump (OMNIPOD DASH INTRO, GEN 4,) KIT Use as instructed to administer insulin Patient not taking: Reported on 08/06/2023 03/18/23   Carlus Pavlov, MD  Insulin Disposable Pump (OMNIPOD DASH PDM, GEN 4,) KIT Use as instructed. Patient not taking: Reported on 08/06/2023 03/18/23   Carlus Pavlov, MD  Insulin Disposable Pump (OMNIPOD DASH PODS, GEN 4,) MISC Use as instructed to administer insulin. Change every 3 days. Patient not taking: Reported on 08/06/2023 03/18/23   Carlus Pavlov, MD  insulin lispro (HUMALOG KWIKPEN) 100 UNIT/ML KwikPen Inject 4-8 Units into the skin 3 (three) times daily before meals. 04/27/23   Carlus Pavlov, MD  Insulin Pen Needle 32G X 4 MM MISC Use 4x a day 09/15/22   Carlus Pavlov, MD  latanoprost (XALATAN) 0.005 % ophthalmic solution Place 1 drop into both eyes at bedtime.    [provider]  levothyroxine (SYNTHROID, LEVOTHROID) 25 MCG tablet Take 25 mcg by mouth daily before breakfast.    [provider]  memantine (NAMENDA) 10 MG tablet TOME 1 TABLETA POR LA BOCA CADA  NOCHE POR 2 SEMANAS LUEGO 1  TABLETA POR LA BOCA DOS VECES AL DIA 07/28/23   Wertman, Sung Amabile, PA-C  sertraline (ZOLOFT) 50 MG tablet Take 50 mg by mouth in the morning and at bedtime. 08/27/22   [provider]  traZODone (DESYREL) 100 MG tablet Take 100 mg by mouth at bedtime.  [provider]      Allergies    Atorvastatin and Other    Review of Systems   Review of Systems  HENT:  Positive for facial swelling.   All other systems reviewed and are negative.   Physical Exam Updated Vital Signs BP 137/77   Pulse 84   Temp 97.9 F (36.6 C) (Oral)   Resp 16   Ht 4\' 11"  (1.499 m)   Wt 49.9 kg   SpO2 100%   BMI 22.22 kg/m  Physical Exam Vitals and nursing note reviewed.  Constitutional:      Appearance: Normal appearance.  HENT:     Head: Normocephalic and atraumatic.     Comments: Swelling and  small ecchymoses to the bridge of the nose Eyes:     General: Lids are normal.     Extraocular Movements: Extraocular movements intact.     Conjunctiva/sclera: Conjunctivae normal.     Pupils: Pupils are equal, round, and reactive to light.  Pulmonary:     Effort: Pulmonary effort is normal. No respiratory distress.  Skin:    General: Skin is warm and dry.     Comments: Abrasion over right elbow  Neurological:     Mental Status: She is alert.  Psychiatric:        Mood and Affect: Mood normal.        Behavior: Behavior normal.     ED Results / Procedures / Treatments   Labs (all labs ordered are listed, but only abnormal results are displayed) Labs Reviewed - No data to display  EKG None  Radiology DG Nasal Bones  Result Date: 11/07/2023 CLINICAL DATA:  Nasal injury.  Nasal swelling after a fall. EXAM: NASAL BONES - 3+ VIEW COMPARISON:  CT head 11/07/2023 FINDINGS: Soft tissue swelling over the bridge of the nose. Nasal bones appear intact. No acute depressed fractures identified. Paranasal sinuses are clear. Orbital rims and facial bones appear intact. Nasal septum is midline. IMPRESSION: Soft tissue swelling.  No acute displaced fractures are identified. Electronically Signed   By: Burman Nieves M.D.   On: 11/07/2023 21:19   DG Elbow Complete Right  Result Date: 11/07/2023 CLINICAL DATA:  Injury, fall. EXAM: RIGHT ELBOW - COMPLETE 3+ VIEW COMPARISON:  None Available. FINDINGS: There is no evidence of fracture, dislocation, or joint effusion. There is no evidence of arthropathy or other focal bone abnormality. Soft tissues are unremarkable. IMPRESSION: Negative. Electronically Signed   By: Darliss Cheney M.D.   On: 11/07/2023 20:40   CT Cervical Spine Wo Contrast  Result Date: 11/07/2023 CLINICAL DATA:  Status post fall. EXAM: CT CERVICAL SPINE WITHOUT CONTRAST TECHNIQUE: Multidetector CT imaging of the cervical spine was performed without intravenous contrast. Multiplanar CT  image reconstructions were also generated. RADIATION DOSE REDUCTION: This exam was performed according to the departmental dose-optimization program which includes automated exposure control, adjustment of the mA and/or kV according to patient size and/or use of iterative reconstruction technique. COMPARISON:  None Available. FINDINGS: Alignment: There is mild reversal of the normal cervical spine lordosis. Approximately 1 mm to 2 mm anterolisthesis of the C4 vertebral body is noted on C5. Skull base and vertebrae: No acute fracture. No primary bone lesion or focal pathologic process. Soft tissues and spinal canal: No prevertebral fluid or swelling. No visible canal hematoma. Disc levels: Mild to moderate severity endplate sclerosis and posterior bony spurring are seen at the level of C6-C7. Mild to moderate severity anterior osteophyte formation is also present at  C5-C6 and C6-C7. Mild to moderate severity intervertebral disc space narrowing is seen at C6-C7. Bilateral moderate to marked severity multilevel facet joint hypertrophy is noted. Upper chest: Negative. Other: None. IMPRESSION: 1. No acute fracture or traumatic subluxation. 2. Mild to moderate severity degenerative changes at the levels of C5-C6 and C6-C7. 3. Approximately 1 mm to 2 mm anterolisthesis of the C4 vertebral body on C5. Electronically Signed   By: Aram Candela M.D.   On: 11/07/2023 20:35   CT Head Wo Contrast  Result Date: 11/07/2023 CLINICAL DATA:  Status post fall. EXAM: CT HEAD WITHOUT CONTRAST TECHNIQUE: Contiguous axial images were obtained from the base of the skull through the vertex without intravenous contrast. RADIATION DOSE REDUCTION: This exam was performed according to the departmental dose-optimization program which includes automated exposure control, adjustment of the mA and/or kV according to patient size and/or use of iterative reconstruction technique. COMPARISON:  Apr 17, 2022 FINDINGS: Brain: There is mild  cerebral atrophy with widening of the extra-axial spaces and ventricular dilatation. There are areas of decreased attenuation within the white matter tracts of the supratentorial brain, consistent with microvascular disease changes. Vascular: No hyperdense vessel or unexpected calcification. Skull: Normal. Negative for fracture or focal lesion. Sinuses/Orbits: No acute finding. Other: There is mild bilateral paranasal soft tissue swelling, right slightly greater than left. A small amount of right-sided paranasal soft tissue air is also noted. IMPRESSION: 1. No acute intracranial abnormality. 2. Mild bilateral paranasal soft tissue swelling, right slightly greater than left. Electronically Signed   By: Aram Candela M.D.   On: 11/07/2023 20:32    Procedures Procedures    Medications Ordered in ED Medications - No data to display  ED Course/ Medical Decision Making/ A&P                                 Medical Decision Making Amount and/or Complexity of Data Reviewed Radiology: ordered.  This patient is a 72 y.o. female  who presents to the ED for concern of fall with facial injury, some eye discomfort.  Differential diagnoses prior to evaluation: The emergent differential diagnosis includes, but is not limited to,  fractures, dislocations, intracranial bleeding, conjunctival abrasion. This is not an exhaustive differential.   Past Medical History / Co-morbidities / Social History: Cognitive changes, depression, T2DM, hypothyroidism, HLD  Physical Exam: Physical exam performed. The pertinent findings include: Mildly hypertensive, otherwise normal vital signs. Swelling over the bridge of the nose, abrasion over right elbow. Eyes normal.  Lab Tests/Imaging studies: I personally interpreted labs/imaging and the pertinent results include:  CT head and cervical spine unremarkable. XR's of nasal bones and R elbow normal. I agree with the radiologist interpretation.  Disposition: After  consideration of the diagnostic results and the patients response to treatment, I feel that emergency department workup does not suggest an emergent condition requiring admission or immediate intervention beyond what has been performed at this time. The plan is: discharge to home with symptomatic management of facial injuries after mechanical fall. The patient is safe for discharge and has been instructed to return immediately for worsening symptoms, change in symptoms or any other concerns.  Final Clinical Impression(s) / ED Diagnoses Final diagnoses:  Fall, initial encounter  Facial injury, initial encounter    Rx / DC Orders ED Discharge Orders     None      Portions of this report may have been transcribed using voice  recognition software. Every effort was made to ensure accuracy; however, inadvertent computerized transcription errors may be present.    Jeanella Flattery 11/07/23 2216    Glyn Ade, MD 11/07/23 2302

## 2023-11-07 NOTE — ED Notes (Signed)
Pt provided discharge instructions and prescription information. Pt was given the opportunity to ask questions and questions were answered.   

## 2023-11-07 NOTE — Discharge Instructions (Signed)
You were seen emergency department after a fall.  As we discussed your CT's and x-rays did not show any broken bones or internal bleeding.  I recommend using ice over the nose to help with swelling.  You can take ibuprofen, Tylenol, or naproxen as needed.

## 2023-11-07 NOTE — ED Triage Notes (Signed)
Patient reports tripping on a rock and fell face first into gravel. Patient hit her head. Denies LOC. Does not take blood thinners. Patient c/o headache and nose pain. Also has small abrasions on elbows.

## 2023-11-08 ENCOUNTER — Ambulatory Visit: Payer: Medicare Other | Admitting: Internal Medicine

## 2023-11-08 DIAGNOSIS — S0993XA Unspecified injury of face, initial encounter: Secondary | ICD-10-CM | POA: Diagnosis not present

## 2023-11-10 DIAGNOSIS — H4311 Vitreous hemorrhage, right eye: Secondary | ICD-10-CM | POA: Diagnosis not present

## 2023-11-11 DIAGNOSIS — E1165 Type 2 diabetes mellitus with hyperglycemia: Secondary | ICD-10-CM | POA: Diagnosis not present

## 2023-11-16 ENCOUNTER — Encounter: Payer: Self-pay | Admitting: Internal Medicine

## 2023-11-16 ENCOUNTER — Encounter (INDEPENDENT_AMBULATORY_CARE_PROVIDER_SITE_OTHER): Payer: Medicare Other | Admitting: Internal Medicine

## 2023-11-16 ENCOUNTER — Telehealth: Payer: Self-pay | Admitting: Internal Medicine

## 2023-11-16 VITALS — Temp 98.0°F | Ht 59.0 in | Wt 101.0 lb

## 2023-11-16 DIAGNOSIS — Z794 Long term (current) use of insulin: Secondary | ICD-10-CM

## 2023-11-16 DIAGNOSIS — E113599 Type 2 diabetes mellitus with proliferative diabetic retinopathy without macular edema, unspecified eye: Secondary | ICD-10-CM

## 2023-11-16 NOTE — Telephone Encounter (Signed)
Per Dr. Lindley Magnus for pt to establish care back with Dr. Ardyth Harps

## 2023-11-17 NOTE — Progress Notes (Signed)
This encounter was created in error - please disregard.

## 2023-11-24 DIAGNOSIS — H4311 Vitreous hemorrhage, right eye: Secondary | ICD-10-CM | POA: Diagnosis not present

## 2023-11-30 DIAGNOSIS — G629 Polyneuropathy, unspecified: Secondary | ICD-10-CM | POA: Diagnosis not present

## 2023-11-30 DIAGNOSIS — E039 Hypothyroidism, unspecified: Secondary | ICD-10-CM | POA: Diagnosis not present

## 2023-11-30 DIAGNOSIS — E1165 Type 2 diabetes mellitus with hyperglycemia: Secondary | ICD-10-CM | POA: Diagnosis not present

## 2023-11-30 DIAGNOSIS — G3184 Mild cognitive impairment, so stated: Secondary | ICD-10-CM | POA: Diagnosis not present

## 2023-11-30 LAB — TSH: TSH: 3.68 (ref 0.41–5.90)

## 2023-11-30 LAB — BASIC METABOLIC PANEL: EGFR: 73

## 2023-11-30 LAB — LAB REPORT - SCANNED: A1c: 7.4

## 2023-12-09 ENCOUNTER — Encounter: Payer: Self-pay | Admitting: Internal Medicine

## 2023-12-09 ENCOUNTER — Ambulatory Visit: Payer: Medicare Other | Admitting: Internal Medicine

## 2023-12-09 VITALS — BP 120/60 | HR 70 | Ht 59.0 in | Wt 97.4 lb

## 2023-12-09 DIAGNOSIS — E039 Hypothyroidism, unspecified: Secondary | ICD-10-CM | POA: Diagnosis not present

## 2023-12-09 DIAGNOSIS — Z794 Long term (current) use of insulin: Secondary | ICD-10-CM

## 2023-12-09 DIAGNOSIS — E119 Type 2 diabetes mellitus without complications: Secondary | ICD-10-CM

## 2023-12-09 DIAGNOSIS — E11319 Type 2 diabetes mellitus with unspecified diabetic retinopathy without macular edema: Secondary | ICD-10-CM

## 2023-12-09 DIAGNOSIS — E785 Hyperlipidemia, unspecified: Secondary | ICD-10-CM

## 2023-12-09 MED ORDER — TRESIBA FLEXTOUCH 100 UNIT/ML ~~LOC~~ SOPN: 8.0000 [IU] | PEN_INJECTOR | Freq: Every day | SUBCUTANEOUS | Status: DC

## 2023-12-09 NOTE — Patient Instructions (Addendum)
 Please add back: - Tresiba  8 units daily  Continue Levothyroxine 25 mcg daily.  Take the thyroid  hormone every day, with water, at least 30 minutes before breakfast, separated by at least 4 hours from: - acid reflux medications - calcium - iron - multivitamins  Please return in 3 months.

## 2023-12-09 NOTE — Progress Notes (Signed)
 Patient ID: Haley Bartlett, female   DOB: [birth year], 73 y.o.   MRN: [id number]  HPI: Haley Bartlett is a 73 y.o.-year-old female, returning for follow-up for DM2, dx in 1993, on insulin  since 2002, uncontrolled, with complications (DR).  She previously saw Dr. Kassie, last visit with me 4 months ago. She is accompanied by her husband who translates for us .   Interim history: No increased urination, blurry vision, nausea, chest pain. She recently had a fall 2012 71/20/2024 with facial injuries. She developed an abscess at the insulin  inj. Site - 4 mo ago. She improved her diet since last OV.  She Lost several pounds since then.  Reviewed HbA1c: 11/30/2023: HbA1c 7.4% Lab Results  Component Value Date   HGBA1C 8.3 (A) 08/06/2023   HGBA1C 7.9% 01/15/2023   HGBA1C 8.6 (A) 04/17/2022   HGBA1C 8.5 (A) 01/08/2022   HGBA1C 8.2 (A) 09/30/2021   HGBA1C 7.8 (A) 08/26/2021   HGBA1C 6.9 (A) 04/22/2021   HGBA1C 6.9 (A) 02/18/2021  01/15/2023: HbA1c 7.9% 07/30/2022: HbA1c 7.7% 03/26/2022: HbA1c 9.0%, C-peptide 1.6, glucose 217 08/13/2010: GAD antibody <1, Glu 164, C peptide 1.1  She was previously on: - Metformin  500 mg 2x a day - Prandin  1 mg 3 times a day before meals - Actos  30 mg before breakfast - Farxiga  10 mg before breakfast - Humalog  11 units before the 3 meals  - only if sugars >300 She was taken off insulin  but restarted 01/2022. Januvia  and Rybelsus  were too expensive.  At last visit she was not taking the recommended regimen: - Metformin  500 mg 2x a day >> not taken since last OV 2/2 UTIs - Tresiba  10 >> she decreased to 6 units daily 2/2 lows - Humalog  4-5  units 15 min before each meal and increase the dose as needed >> 6 units after dinner  At last visit, she was still confused about her regimen and was using different doses than recommended: - Tresiba  6 units daily >> 10-12 >> 10 units daily >> but using only 5 units of Tresiba  - Humalog  7-8 units 3-4x a day >> 10  min before EACH MEAL 9 units before b'fast >> but still using 8 units 4 units before lunch >> still not injecting 4-5 units before dinner >> but still using 10 units  At that time, I recommended the following regimen: - Tresiba  10 units daily - Humalog  15 min before EACH MEAL 9 units before b'fast 4 units before lunch 4-5 units before dinner  However, at today's visit, husband is still living the patient is off all insulin  due to improvement in blood sugars after she started to improve her diet.  Pt checks her sugars >4x a day and they are -with receiver:  Previously:   Lowest sugar was 120 >> 67 >> 53; she has hypoglycemia awareness at 70.  Highest sugar was HI >> 400 >> 400s. She was not emergency room with hypoglycemia (09/2022).  She was found at home on the floor, by her daughter. She took her insulin , but did not eat lunch, which is unusual for her.  Glucose was 26 on Dexcom.  She did not hear the alarm.  It was noted that she was using higher doses of insulin  than recommended around the time of the episode.  Glucometer: Accu-Chek guide  Meals: - b'fast: veggies + turkey sausage x2 - lunch: orange - dinner: chicken soup, meat, veggies  - last BUN/creatinine:  11/30/2023:  Lab Results  Component Value Date  BUN 12 09/24/2022   BUN 9 04/18/2022   CREATININE 0.83 09/24/2022   CREATININE 0.80 04/18/2022   No results found for: MICRALBCREAT She is not on ACE inhibitor/ARB.  -+ HL; last set of lipids: 09/06/2023: 230/80/84/133 Lipid Panel[[id number]]     Collected: 03/26/2022 04:20 PM     Specimen Received: 03/26/2022 05:00 AM       Cholesterol, Total [001065] 247 mg/dL H 899-800 mg/dL  Triglycerides [998827] 85 mg/dL Normal 9-850 mg/dL  HDL Cholesterol [988182] 104 mg/dL Normal >60 mg/dL  VLDL Cholesterol Cal [988080] 14 mg/dL Normal 4-59 mg/dL  LDL Chol Calc (NIH) [987940] 129 mg/dL H 9-00 mg/dL  No results found for: CHOL, HDL, LDLCALC, LDLDIRECT, TRIG,  CHOLHDL She is intolerant to statins. On Zetia 10 mg daily.  - last eye exam was in 04/22/2023: + DR reportedly.   - + numbness and tingling in her feet.  Last foot exam 01/15/2023.  She is on Neurontin  100 mg at bedtime.  She also has hypothyroidism - on levothyroxine 25 mcg daily, taken: - in am - fasting - at least 1h from b'fast - no calcium - no iron - prev. + multivitamins at lunchtime, now stopped - no PPIs - not on Biotin  Reviewed TSH levels: 11/30/2023: TSH 3.68 Lab Results  Component Value Date   TSH 1.615 09/24/2022  03/17/2022: TSH 2.0  ROS: + See HPI  Past Medical History:  Diagnosis Date   Depression    Past Surgical History:  Procedure Laterality Date   ABDOMINAL HYSTERECTOMY     APPENDECTOMY     THYROIDECTOMY     Social History   Socioeconomic History   Marital status: Married    Spouse name: Geographical Information Systems Officer   Number of children: 2   Years of education: 12   Highest education level: Not on file  Occupational History   Occupation: retired  Tobacco Use   Smoking status: Never   Smokeless tobacco: Never  Vaping Use   Vaping status: Never Used  Substance and Sexual Activity   Alcohol use: No   Drug use: No   Sexual activity: Not on file  Other Topics Concern   Not on file  Social History Narrative   Lives with Haley Bartlett, husband   Caffeine use: Coffee daily   Right handed    Social Drivers of Corporate Investment Banker Strain: Not on file  Food Insecurity: Not on file  Transportation Needs: Not on file  Physical Activity: Not on file  Stress: Not on file  Social Connections: Not on file  Intimate Partner Violence: Not on file   Current Outpatient Medications on File Prior to Visit  Medication Sig Dispense Refill   Accu-Chek Softclix Lancets lancets Use as instructed 1x a day 100 each 3   Continuous Glucose Sensor (DEXCOM G7 SENSOR) MISC 1 Device by Does not apply route continuous. 9 each 0   donepezil  (ARICEPT ) 10 MG tablet TOME 1 TABLETA  POR LA BOCA CADA  DIA 90 tablet 3   dorzolamide-timolol (COSOPT) 22.3-6.8 MG/ML ophthalmic solution Place 1 drop into both eyes 2 (two) times daily.     gabapentin  (NEURONTIN ) 100 MG capsule Take 100 mg by mouth at bedtime.     Glucagon  3 MG/DOSE POWD Place 3 mg into the nose once as needed for up to 1 dose. 2 each 11   glucose blood (ACCU-CHEK GUIDE) test strip 1 each by Other route 2 (two) times daily. And lancets 2/day 200 each 3   HUMALOG   MIX 75/25 (75-25) 100 UNIT/ML SUSP injection Inject 10-12 Units into the skin daily before supper.     insulin  degludec (TRESIBA  FLEXTOUCH) 100 UNIT/ML FlexTouch Pen Inject 10 Units into the skin daily. 9 mL 3   Insulin  Disposable Pump (OMNIPOD DASH INTRO, GEN 4,) KIT Use as instructed to administer insulin  1 kit 0   Insulin  Disposable Pump (OMNIPOD DASH PDM, GEN 4,) KIT Use as instructed. 1 kit 0   Insulin  Disposable Pump (OMNIPOD DASH PODS, GEN 4,) MISC Use as instructed to administer insulin . Change every 3 days. 30 each 3   insulin  lispro (HUMALOG  KWIKPEN) 100 UNIT/ML KwikPen Inject 4-8 Units into the skin 3 (three) times daily before meals. 30 mL 3   Insulin  Pen Needle 32G X 4 MM MISC Use 4x a day 300 each 3   latanoprost (XALATAN) 0.005 % ophthalmic solution Place 1 drop into both eyes at bedtime.     levothyroxine (SYNTHROID, LEVOTHROID) 25 MCG tablet Take 25 mcg by mouth daily before breakfast.     memantine  (NAMENDA ) 10 MG tablet TOME 1 TABLETA POR LA BOCA CADA  NOCHE POR 2 SEMANAS LUEGO 1  TABLETA POR LA BOCA DOS VECES AL DIA 180 tablet 3   sertraline (ZOLOFT) 50 MG tablet Take 50 mg by mouth in the morning and at bedtime.     traZODone (DESYREL) 100 MG tablet Take 100 mg by mouth at bedtime.     No current facility-administered medications on file prior to visit.   Allergies  Allergen Reactions   Atorvastatin Other (See Comments)    Reaction??   Other Rash and Other (See Comments)    A wrinkle cream (name not recalled) broke out the chest and  face   Family History  Problem Relation Age of Onset   Dementia Sister    Diabetes Sister    Diabetes Mother    Diabetes Brother    PE: BP 120/60   Pulse 70   Ht 4' 11 (1.499 m)   Wt 97 lb 6.4 oz (44.2 kg)   SpO2 97%   BMI 19.67 kg/m  Wt Readings from Last 3 Encounters:  12/09/23 97 lb 6.4 oz (44.2 kg)  11/16/23 101 lb (45.8 kg)  11/07/23 110 lb (49.9 kg)   Constitutional: thin, in NAD Eyes: EOMI, no exophthalmos ENT: no thyromegaly, no cervical lymphadenopathy Cardiovascular: RRR, No MRG Respiratory: CTA B Musculoskeletal: no deformities Skin: warm, no rashes Neurological: no tremor with outstretched hands  ASSESSMENT: 1. DM2, insulin -dependent, uncontrolled, with complications - DR  Component     Latest Ref Rng 08/20/2022  Glucose     65 - 99 mg/dL 729 (H)   Islet Cell Ab     Neg:<1:1  Negative   C-Peptide     0.80 - 3.85 ng/mL 1.04   ZNT8 Antibodies     <15 U/mL <10   Glutamic Acid Decarb Ab     <5 IU/mL <5   Glucose is too high to thoroughly interpret the C-peptide, however, it is encouraging that this is not suppressed.  The antipancreatic antibodies are not elevated.  2. HL  3.  Hypothyroidism  PLAN:  1. Patient with longstanding, uncontrolled, type 2 diabetes, basal large bolus insulin  regimen, difficult to control due to history of noncompliance with re/commended regimen.  She has early Alzheimer's dementia.  She gets confused with her regimen and usually takes different doses than recommended.  At last visit, patient's husband inquired about an OmniPod pump.  I explained that she  was not a candidate for the pump due to the intricacies of the pump management.  Discussed about possible risks. -At last visit, sugars were extremely high throughout the day and night, peaking around 12 PM and improved slightly afterwards but with another significant hyperglycemic peak around 9 PM.  She was taking completely different doses than recommended.  We adjusted  these: Advised her to use a higher dose of insulin  before breakfast, to take some insulin  before lunch and to reduce the Humalog  before dinner.  I also advised her to increase Tresiba  dose back to 10 units daily.  HbA1c was 8.3% but she had another HbA1c obtained approximately a week ago and this was better, at 7.4%. - We checked her for insulin  deficiency and antipancreatic autoimmunity and the investigation was negative. CGM interpretation: -At today's visit, we reviewed her CGM downloads: It appears that 40% of values are in target range (goal >70%), while 60% are higher than 180 (goal <25%), and 0% are lower than 70 (goal <4%).  The calculated average blood sugar is 199.  The projected HbA1c for the next 3 months (GMI) is 8.1%. -Reviewing the CGM trends, sugars appear to be high, fluctuating mostly above the target range upper limit of 180 mg/dL.  There is an increase in blood sugars as the day goes on, but overall, postmeal variability has improved since last visit.  Patient is apparently off all insulin  as diet improved after last visit.  However, based on this pattern, I recommended to add back only Tresiba  at a lower dose.  If this is not enough, we may consider adding an oral medication at next visit. - I suggested to:  Patient Instructions  Please add back: - Tresiba  8 units daily  Continue Levothyroxine 25 mcg daily.  Take the thyroid  hormone every day, with water, at least 30 minutes before breakfast, separated by at least 4 hours from: - acid reflux medications - calcium - iron - multivitamins  Please return in 3 months.   - advised to check sugars at different times of the day - 4x a day, rotating check times - advised for yearly eye exams >> she is UTD - will check an ACR today - return to clinic in 3 months  2. HL -Latest lipid panel from 08/2023 was reviewed: LDL above target, otherwise fractions at goal: No results found for: CHOL, HDL, LDLCALC, LDLDIRECT, TRIG,  CHOLHDL -She is intolerant to statins, but she is on ezetimibe 10 mg daily with good tolerance  3.  Hypothyroidism - latest thyroid  labs reviewed with pt. >> normal: A week ago: 3.68 - she continues on LT4 25 mcg daily - pt feels good on this dose. - we discussed about taking the thyroid  hormone every day, with water, >30 minutes before breakfast, separated by >4 hours from acid reflux medications, calcium, iron, multivitamins. Pt. is taking it correctly.  Lela Fendt, MD PhD [facility] End[facility]

## 2023-12-10 LAB — MICROALBUMIN / CREATININE URINE RATIO
Creatinine, Urine: 99 mg/dL (ref 20–275)
Microalb Creat Ratio: 3 mg/g{creat} (ref <30)
Microalb, Ur: 0.3 mg/dL

## 2024-01-12 DIAGNOSIS — H40053 Ocular hypertension, bilateral: Secondary | ICD-10-CM | POA: Diagnosis not present

## 2024-01-17 ENCOUNTER — Telehealth: Payer: Self-pay | Admitting: Internal Medicine

## 2024-01-17 NOTE — Telephone Encounter (Signed)
 Patient called in and wanted to know if she can go back on a diabetic medication she was on before but not the metformin . She couldn't remember the name of it and asked if someone clinical could call her back at [phone]. Patient was seen last 12/09/23.

## 2024-01-18 NOTE — Telephone Encounter (Signed)
Q, Please go into my last OV note and see her medication history (the beginning of the note).  I am not sure exactly which medication is she talking about... We may also need to schedule an appointment to discuss about it and see which of the medicines are the best options for her if her blood sugars are uncontrolled.

## 2024-01-21 ENCOUNTER — Ambulatory Visit: Payer: Medicare Other | Admitting: Internal Medicine

## 2024-01-21 ENCOUNTER — Encounter: Payer: Self-pay | Admitting: Internal Medicine

## 2024-01-21 VITALS — BP 120/60 | HR 70 | Ht 59.0 in | Wt 99.0 lb

## 2024-01-21 DIAGNOSIS — Z794 Long term (current) use of insulin: Secondary | ICD-10-CM | POA: Diagnosis not present

## 2024-01-21 DIAGNOSIS — E785 Hyperlipidemia, unspecified: Secondary | ICD-10-CM | POA: Diagnosis not present

## 2024-01-21 DIAGNOSIS — E119 Type 2 diabetes mellitus without complications: Secondary | ICD-10-CM

## 2024-01-21 DIAGNOSIS — E039 Hypothyroidism, unspecified: Secondary | ICD-10-CM

## 2024-01-21 LAB — POCT GLYCOSYLATED HEMOGLOBIN (HGB A1C): Hemoglobin A1C: 6.6 % — AB (ref 4.0–5.6)

## 2024-01-21 MED ORDER — REPAGLINIDE 1 MG PO TABS: 1.0000 mg | ORAL_TABLET | Freq: Two times a day (BID) | ORAL | 3 refills | Status: DC

## 2024-01-21 NOTE — Progress Notes (Signed)
Patient ID: Haley Bartlett, female   DOB: Dec 05, 1951, 73 y.o.   MRN: [id number]  HPI: Haley Bartlett is a 73 y.o.-year-old female, returning for follow-up for DM2, dx in 1993, on insulin since 2002, uncontrolled, with complications (DR).  She previously saw Dr. Everardo All, last visit with me 1.5 months ago. She is accompanied by her husband who translates for [place] and offers most of the history.   Interim history: No increased urination, blurry vision, nausea, chest pain. Husband scheduled this appointment earlier as she is concerned that the patient is not eating enough.  Upon questioning, she is trying to avoid eating because her sugars increased afterwards.  Last night after eating rice and then cake, sugars increased to the 300s.  Reviewed HbA1c: 11/30/2023: HbA1c 7.4% Lab Results  Component Value Date   HGBA1C 8.3 (A) 08/06/2023   HGBA1C 7.9% 01/15/2023   HGBA1C 8.6 (A) 04/17/2022   HGBA1C 8.5 (A) 01/08/2022   HGBA1C 8.2 (A) 09/30/2021   HGBA1C 7.8 (A) 08/26/2021   HGBA1C 6.9 (A) 04/22/2021   HGBA1C 6.9 (A) 02/18/2021  01/15/2023: HbA1c 7.9% 07/30/2022: HbA1c 7.7% 03/26/2022: HbA1c 9.0%, C-peptide 1.6, glucose 217 08/13/2010: GAD antibody <1, Glu 164, C peptide 1.1  She was previously on: - Metformin 500 mg 2x a day - Prandin 1 mg 3 times a day before meals - Actos 30 mg before breakfast - Farxiga 10 mg before breakfast - Humalog 11 units before the 3 meals  - only if sugars >300 She was taken off insulin but restarted 01/2022. Januvia and Rybelsus were too expensive.  At last visit she was not taking the recommended regimen: - Metformin 500 mg 2x a day >> not taken since last OV 2/2 UTIs - Tresiba 10 >> she decreased to 6 units daily 2/2 lows - Humalog 4-5  units 15 min before each meal and increase the dose as needed >> 6 units after dinner  She was previously confused about her regimen and was using different doses than recommended: - Tresiba 6 units daily >> 10-12 >>  10 units daily >> but using only 5 units of Tresiba - Humalog 7-8 units 3-4x a day >> 10 min before EACH MEAL 9 units before b'fast >> but still using 8 units 4 units before lunch >> still not injecting 4-5 units before dinner >> but still using 10 units  I then  recommended the following regimen: - Tresiba 10 units daily - Humalog 15 min before EACH MEAL 9 units before b'fast 4 units before lunch 4-5 units before dinner  At last visit, husband  mentioned that patient wass off all insulin due to improvement in blood sugars after she started to improve her diet.  At that time, we added back [place] 8 units daily.  Pt checks her sugars >4x a day and they are -with receiver:  Previously:  Previously:   Lowest sugar was 120 >> 67 >> 53 >> 60s; she has hypoglycemia awareness at 70.  Highest sugar was HI >> 400 >> 400s >> 390. She was not emergency room with hypoglycemia (09/2022).  She was found at home on the floor, by her daughter. She took her insulin, but did not eat lunch, which is unusual for her.  Glucose was 26 on Dexcom.  She did not hear the alarm.  It was noted that she was using higher doses of insulin than recommended around the time of the episode.  Glucometer: Accu-Chek guide  Meals: - b'fast: veggies + [place] sausage  x2 - lunch: orange - dinner: chicken soup, meat, veggies  - last BUN/creatinine:  11/30/2023:  Lab Results  Component Value Date   BUN 12 09/24/2022   BUN 9 04/18/2022   CREATININE 0.83 09/24/2022   CREATININE 0.80 04/18/2022   Lab Results  Component Value Date   MICRALBCREAT 3 12/09/2023   She is not on ACE inhibitor/ARB.  -+ HL; last set of lipids: 09/06/2023: 230/80/84/133 Lipid Panel[[id number]]     Collected: 03/26/2022 04:20 PM     Specimen Received: 03/26/2022 05:00 AM       Cholesterol, Total [001065] 247 mg/dL H 161-096 mg/dL  Triglycerides [045409] 85 mg/dL Normal 8-119 mg/dL  HDL Cholesterol [147829] 104 mg/dL Normal >56 mg/dL   VLDL Cholesterol Cal [011919] 14 mg/dL Normal 2-13 mg/dL  LDL Chol Calc (NIH) [086578] 129 mg/dL H 4-69 mg/dL  No results found for: "CHOL", "HDL", "LDLCALC", "LDLDIRECT", "TRIG", "CHOLHDL" She is intolerant to statins. On Zetia 10 mg daily.  - last eye exam was in 04/22/2023: + DR reportedly.   - + numbness and tingling in her feet.  Last foot exam 01/15/2023.  She is on Neurontin 100 mg at bedtime.  She also has hypothyroidism - on levothyroxine 25 mcg daily, taken: - in am - fasting - at least 1h from b'fast - no calcium - no iron - prev. + multivitamins at lunchtime, now stopped - no PPIs - not on Biotin  Reviewed TSH levels: Lab Results  Component Value Date   TSH 3.68 11/30/2023   TSH 1.615 09/24/2022  03/17/2022: TSH 2.0  ROS: + See HPI  Past Medical History:  Diagnosis Date   Depression    Past Surgical History:  Procedure Laterality Date   ABDOMINAL HYSTERECTOMY     APPENDECTOMY     THYROIDECTOMY     Social History   Socioeconomic History   Marital status: Married    Spouse name: Geographical information systems officer   Number of children: 2   Years of education: 12   Highest education level: Not on file  Occupational History   Occupation: retired  Tobacco Use   Smoking status: Never   Smokeless tobacco: Never  Vaping Use   Vaping status: Never Used  Substance and Sexual Activity   Alcohol use: No   Drug use: No   Sexual activity: Not on file  Other Topics Concern   Not on file  Social History Narrative   Lives with Oswaldo Done, husband   Caffeine use: Coffee daily   Right handed    Social Drivers of Corporate investment banker Strain: Not on file  Food Insecurity: Not on file  Transportation Needs: Not on file  Physical Activity: Not on file  Stress: Not on file  Social Connections: Not on file  Intimate Partner Violence: Not on file   Current Outpatient Medications on File Prior to Visit  Medication Sig Dispense Refill   Accu-Chek Softclix Lancets lancets Use as  instructed 1x a day (Patient not taking: Reported on 12/09/2023) 100 each 3   donepezil (ARICEPT) 10 MG tablet TOME 1 TABLETA POR LA BOCA CADA  DIA 90 tablet 3   dorzolamide-timolol (COSOPT) 22.3-6.8 MG/ML ophthalmic solution Place 1 drop into both eyes 2 (two) times daily. (Patient not taking: Reported on 12/09/2023)     gabapentin (NEURONTIN) 100 MG capsule Take 100 mg by mouth at bedtime.     Glucagon 3 MG/DOSE POWD Place 3 mg into the nose once as needed for up to 1 dose. (Patient not  taking: Reported on 12/09/2023) 2 each 11   glucose blood (ACCU-CHEK GUIDE) test strip 1 each by Other route 2 (two) times daily. And lancets 2/day (Patient not taking: Reported on 12/09/2023) 200 each 3   HUMALOG MIX 75/25 (75-25) 100 UNIT/ML SUSP injection Inject 10-12 Units into the skin daily before supper. (Patient not taking: Reported on 12/09/2023)     insulin degludec (TRESIBA FLEXTOUCH) 100 UNIT/ML FlexTouch Pen Inject 8-10 Units into the skin daily.     Insulin Pen Needle 32G X 4 MM MISC Use 4x a day (Patient not taking: Reported on 12/09/2023) 300 each 3   latanoprost (XALATAN) 0.005 % ophthalmic solution Place 1 drop into both eyes at bedtime. (Patient not taking: Reported on 12/09/2023)     levothyroxine (SYNTHROID, LEVOTHROID) 25 MCG tablet Take 25 mcg by mouth daily before breakfast.     memantine (NAMENDA) 10 MG tablet TOME 1 TABLETA POR LA BOCA CADA  NOCHE POR 2 SEMANAS LUEGO 1  TABLETA POR LA BOCA DOS VECES AL DIA 180 tablet 3   sertraline (ZOLOFT) 50 MG tablet Take 50 mg by mouth in the morning and at bedtime.     traZODone (DESYREL) 100 MG tablet Take 100 mg by mouth at bedtime.     ZETIA 10 MG tablet 1 tablet Orally Once a day for 90 days     No current facility-administered medications on file prior to visit.   Allergies  Allergen Reactions   Atorvastatin Other (See Comments)    Reaction??   Other Rash and Other (See Comments)    A wrinkle cream (name not recalled) broke out the chest and face    Family History  Problem Relation Age of Onset   Dementia Sister    Diabetes Sister    Diabetes Mother    Diabetes Brother    PE: BP 120/60   Pulse 70   Ht 4\' 11"  (1.499 m)   Wt 99 lb (44.9 kg)   SpO2 99%   BMI 20.00 kg/m  Wt Readings from Last 3 Encounters:  01/21/24 99 lb (44.9 kg)  12/09/23 97 lb 6.4 oz (44.2 kg)  11/16/23 101 lb (45.8 kg)   Constitutional: thin, in NAD Eyes: EOMI, no exophthalmos ENT: no thyromegaly, no cervical lymphadenopathy Cardiovascular: RRR, No MRG Respiratory: CTA B Musculoskeletal: no deformities Skin: warm, no rashes Neurological: no tremor with outstretched hands Diabetic Foot Exam - Simple   Simple Foot Form Diabetic Foot exam was performed with the following findings: Yes 01/21/2024  4:21 PM  Visual Inspection No deformities, no ulcerations, no other skin breakdown bilaterally: Yes Sensation Testing Intact to touch and monofilament testing bilaterally: Yes Pulse Check Posterior Tibialis and Dorsalis pulse intact bilaterally: Yes Comments    ASSESSMENT: 1. DM2, insulin-dependent, uncontrolled, with complications - DR  Component     Latest Ref Rng 08/20/2022  Glucose     65 - 99 mg/dL 161 (H)   Islet Cell Ab     Neg:<1:1  Negative   C-Peptide     0.80 - 3.85 ng/mL 1.04   ZNT8 Antibodies     <15 U/mL <10   Glutamic Acid Decarb Ab     <5 IU/mL <5   Glucose is too high to thoroughly interpret the C-peptide, however, it is encouraging that this is not suppressed.  The antipancreatic antibodies are not elevated.  2. HL  3.  Hypothyroidism  PLAN:  1. Patient with longstanding, uncontrolled, type 2 diabetes, on basal insulin but previously on  basal-bolus insulin regimen.  She has uncontrolled diabetes due to history of noncompliance with the recommended regimen and also early Alzheimer's dementia.  She gets confused with her regimen and usually takes different doses than recommended.  Patient's husband inquired about an insulin  pump at the previous visit but I explained that she was not a candidate for the pump due to the intricacies of managing it.  At last visit, sugars appears to be high, fluctuating mostly above the 180 mg/dL mark.  There was an increase in blood sugars as the day went by but overall, postmeal variability was improved.  She was apparently off all insulin after her diet improved.  However, based on the pattern, I recommended to add back [place] at a lower dose.  HbA1c at that time was 7.4%, lower.  Several days ago, they called about possibly escalating her regimen so we scheduled a new appointment. - We checked her for insulin deficiency and antipancreatic autoimmunity and the investigation was negative. CGM interpretation: -At today's visit, we reviewed her CGM downloads: It appears that 72% of values are in target range (goal >70%), while 27% are higher than 180 (goal <25%), and 1% are lower than 70 (goal <4%).  The calculated average blood sugar is 156.  The projected HbA1c for the next 3 months (GMI) is 7.0%. -Reviewing the CGM trends, sugars appear to be improving overnight but occasionally even under 70s and they increase afterwards, with hyperglycemic spikes after breakfast and then again after dinner.  The most significant peaks were last night after she ate a high carb meal and this was followed by a hypoglycemic event around 3 to 4 AM as she took another dose of Tresiba last night to bring her blood sugars down.  I strongly recommended against this practice.  I advised her not to change the dose of [place] and definitely not to use [place] to correct high blood sugars. -We discussed that it appears that she needs mealtime insulin again, however, the patient and her husband would like to see if they can try another medication first.  We discussed about possibly trying a meglitinide again for but I am not sure if this would be enough to counteract her postprandial hyperglycemia.  Patient and her husband  would want to try and they will let me know about the outcome before next visit.  We discussed about the fact that for some meals, she may not need to take the repaglinide, for regular meals to take only 1 tablet LT4 large meals, like last night, to take 2 tablets before the meal.  We can keep the same dose of [place] for now. - I suggested to:  Patient Instructions  Please continue: - Tresiba 8 units daily  Try to restart: - Prandin before meals: No Prandin before small meals 1 mg before regular meals 2 mg before large meals  Continue Levothyroxine 25 mcg daily.  Take the thyroid hormone every day, with water, at least 30 minutes before breakfast, separated by at least 4 hours from: - acid reflux medications - calcium - iron - multivitamins  Please return in 1.5 months.  - advised to check sugars at different times of the day - 4x a day, rotating check times - advised for yearly eye exams >> she is UTD - return to clinic in 3 months  2. HL -Reviewed latest lipid panel from 08/2023: LDL above target, otherwise fractions at goal  -She is intolerant to statins but she is on ezetimibe 10  mg daily with good tolerance  3.  Hypothyroidism - latest thyroid labs reviewed with pt. >> normal: Lab Results  Component Value Date   TSH 3.68 11/30/2023  - she continues on LT4 25 mcg daily - pt feels good on this dose. - we discussed about taking the thyroid hormone every day, with water, >30 minutes before breakfast, separated by >4 hours from acid reflux medications, calcium, iron, multivitamins. Pt. is taking it correctly.  Carlus Pavlov, MD PhD [facility]

## 2024-01-21 NOTE — Patient Instructions (Addendum)
Please continue: - Tresiba 8 units daily  Try to restart: - Prandin before meals: No Prandin before small meals 1 mg before regular meals 2 mg before large meals  Continue Levothyroxine 25 mcg daily.  Take the thyroid hormone every day, with water, at least 30 minutes before breakfast, separated by at least 4 hours from: - acid reflux medications - calcium - iron - multivitamins  Please return in 1.5 months.

## 2024-02-02 ENCOUNTER — Ambulatory Visit: Payer: Medicare Other | Admitting: Physician Assistant

## 2024-02-09 DIAGNOSIS — E1165 Type 2 diabetes mellitus with hyperglycemia: Secondary | ICD-10-CM | POA: Diagnosis not present

## 2024-02-17 DIAGNOSIS — R634 Abnormal weight loss: Secondary | ICD-10-CM | POA: Diagnosis not present

## 2024-02-17 DIAGNOSIS — E039 Hypothyroidism, unspecified: Secondary | ICD-10-CM | POA: Diagnosis not present

## 2024-02-17 DIAGNOSIS — E1165 Type 2 diabetes mellitus with hyperglycemia: Secondary | ICD-10-CM | POA: Diagnosis not present

## 2024-02-17 DIAGNOSIS — G3184 Mild cognitive impairment, so stated: Secondary | ICD-10-CM | POA: Diagnosis not present

## 2024-02-17 DIAGNOSIS — R0989 Other specified symptoms and signs involving the circulatory and respiratory systems: Secondary | ICD-10-CM | POA: Diagnosis not present

## 2024-02-17 DIAGNOSIS — G629 Polyneuropathy, unspecified: Secondary | ICD-10-CM | POA: Diagnosis not present

## 2024-02-17 LAB — LAB REPORT - SCANNED
Calcium: 9.2
EGFR: 49

## 2024-02-25 ENCOUNTER — Other Ambulatory Visit: Payer: Self-pay | Admitting: Internal Medicine

## 2024-02-25 DIAGNOSIS — Z794 Long term (current) use of insulin: Secondary | ICD-10-CM

## 2024-03-02 ENCOUNTER — Ambulatory Visit: Admitting: Physician Assistant

## 2024-03-07 DIAGNOSIS — R300 Dysuria: Secondary | ICD-10-CM | POA: Diagnosis not present

## 2024-03-07 DIAGNOSIS — Z681 Body mass index (BMI) 19 or less, adult: Secondary | ICD-10-CM | POA: Diagnosis not present

## 2024-03-07 DIAGNOSIS — L089 Local infection of the skin and subcutaneous tissue, unspecified: Secondary | ICD-10-CM | POA: Diagnosis not present

## 2024-03-07 DIAGNOSIS — L729 Follicular cyst of the skin and subcutaneous tissue, unspecified: Secondary | ICD-10-CM | POA: Diagnosis not present

## 2024-03-09 ENCOUNTER — Ambulatory Visit: Payer: Medicare Other | Admitting: Internal Medicine

## 2024-03-10 DIAGNOSIS — E1165 Type 2 diabetes mellitus with hyperglycemia: Secondary | ICD-10-CM | POA: Diagnosis not present

## 2024-03-27 ENCOUNTER — Other Ambulatory Visit: Payer: Self-pay | Admitting: Internal Medicine

## 2024-03-27 DIAGNOSIS — E11319 Type 2 diabetes mellitus with unspecified diabetic retinopathy without macular edema: Secondary | ICD-10-CM

## 2024-03-28 DIAGNOSIS — G629 Polyneuropathy, unspecified: Secondary | ICD-10-CM | POA: Diagnosis not present

## 2024-03-28 DIAGNOSIS — E538 Deficiency of other specified B group vitamins: Secondary | ICD-10-CM | POA: Diagnosis not present

## 2024-03-28 DIAGNOSIS — E1165 Type 2 diabetes mellitus with hyperglycemia: Secondary | ICD-10-CM | POA: Diagnosis not present

## 2024-03-28 DIAGNOSIS — Z681 Body mass index (BMI) 19 or less, adult: Secondary | ICD-10-CM | POA: Diagnosis not present

## 2024-04-03 ENCOUNTER — Encounter: Payer: Self-pay | Admitting: Internal Medicine

## 2024-04-03 ENCOUNTER — Ambulatory Visit: Admitting: Internal Medicine

## 2024-04-03 VITALS — BP 118/60 | HR 70 | Ht 59.0 in | Wt 94.8 lb

## 2024-04-03 DIAGNOSIS — E039 Hypothyroidism, unspecified: Secondary | ICD-10-CM | POA: Diagnosis not present

## 2024-04-03 DIAGNOSIS — E785 Hyperlipidemia, unspecified: Secondary | ICD-10-CM | POA: Diagnosis not present

## 2024-04-03 DIAGNOSIS — Z794 Long term (current) use of insulin: Secondary | ICD-10-CM | POA: Diagnosis not present

## 2024-04-03 DIAGNOSIS — E119 Type 2 diabetes mellitus without complications: Secondary | ICD-10-CM

## 2024-04-03 LAB — POCT GLYCOSYLATED HEMOGLOBIN (HGB A1C): Hemoglobin A1C: 6.9 % — AB (ref 4.0–5.6)

## 2024-04-03 MED ORDER — CEQUR SIMPLICITY 2U DEVI: 1.0000 | Freq: Once | 1 refills | Status: AC

## 2024-04-03 MED ORDER — CEQUR SIMPLICITY INSERTER MISC: 0 refills | Status: AC

## 2024-04-03 MED ORDER — INSULIN LISPRO 100 UNIT/ML IJ SOLN: INTRAMUSCULAR | 3 refills | Status: AC

## 2024-04-03 NOTE — Addendum Note (Signed)
 Addended by: Vernon Goodpasture on: 04/03/2024 01:23 PM   Modules accepted: Orders

## 2024-04-03 NOTE — Progress Notes (Signed)
 Patient ID: Haley Bartlett, female   DOB: 04/04/1951, 73 y.o.   MRN: [id number]  HPI: Haley Bartlett is a 73 y.o.-year-old female, returning for follow-up for DM2, dx in 1993, on insulin  since 2002, uncontrolled, with complications (DR).  She previously saw Dr. Washington Hacker, last visit with me 2.5 months ago. She is accompanied by her husband who translates for us  and offers most of the history.   Interim history: No increased urination, blurry vision, nausea, chest pain.  Reviewed HbA1c: Lab Results  Component Value Date   HGBA1C 6.6 (A) 01/21/2024   HGBA1C 8.3 (A) 08/06/2023   HGBA1C 7.9% 01/15/2023   HGBA1C 8.6 (A) 04/17/2022   HGBA1C 8.5 (A) 01/08/2022   HGBA1C 8.2 (A) 09/30/2021   HGBA1C 7.8 (A) 08/26/2021   HGBA1C 6.9 (A) 04/22/2021   HGBA1C 6.9 (A) 02/18/2021  11/30/2023: HbA1c 7.4% 01/15/2023: HbA1c 7.9% 07/30/2022: HbA1c 7.7% 03/26/2022: HbA1c 9.0%, C-peptide 1.6, glucose 217 08/13/2010: GAD antibody <1, Glu 164, C peptide 1.1  She was previously on: - Metformin  500 mg 2x a day - Prandin  1 mg 3 times a day before meals - Actos  30 mg before breakfast - Farxiga  10 mg before breakfast - Humalog  11 units before the 3 meals  - only if sugars >300 She was taken off insulin  but restarted 01/2022. Januvia  and Rybelsus  were too expensive.  At last visit she was not taking the recommended regimen: - Metformin  500 mg 2x a day >> not taken since last OV 2/2 UTIs - Tresiba  10 >> she decreased to 6 units daily 2/2 lows - Humalog  4-5  units 15 min before each meal and increase the dose as needed >> 6 units after dinner  She was previously confused about her regimen and was using different doses than recommended: - Tresiba  6 units daily >> 10-12 >> 10 units daily >> but using only 5 units of Tresiba  - Humalog  7-8 units 3-4x a day >> 10 min before EACH MEAL 9 units before b'fast >> but still using 8 units 4 units before lunch >> still not injecting 4-5 units before dinner >> but  still using 10 units  I then  recommended the following regimen: - Tresiba  10 units daily - Humalog  15 min before EACH MEAL 9 units before b'fast 4 units before lunch 4-5 units before dinner  She then came off Humalog  and at last visit she was on: - Tresiba  8 units daily I also recommended: - Prandin  before meals - but not using it 2/2 UTIs >> Humalog  8 units before D - started 3 weeks ago  Pt checks her sugars >4x a day and they are -with receiver:   Prev.:  Previously:   Lowest sugar was 120 >> 67 >> 53 >> 60s; she has hypoglycemia awareness at 70.  Highest sugar was HI >> 400 >> 400s >> 390 >> HI. She was not emergency room with hypoglycemia (09/2022).  She was found at home on the floor, by her daughter. She took her insulin , but did not eat lunch, which is unusual for her.  Glucose was 26 on Dexcom.  She did not hear the alarm.  It was noted that she was using higher doses of insulin  than recommended around the time of the episode.  Glucometer: Accu-Chek guide  Meals: - b'fast: veggies + [place] sausage x2 - lunch: orange - dinner: chicken soup, meat, veggies  - last BUN/creatinine:  11/30/2023:  Lab Results  Component Value Date   BUN 12 09/24/2022  BUN 9 04/18/2022   CREATININE 0.83 09/24/2022   CREATININE 0.80 04/18/2022   Lab Results  Component Value Date   MICRALBCREAT 3 12/09/2023   She is not on ACE inhibitor/ARB.  -+ HL; last set of lipids: 09/06/2023: 230/80/84/133 Lipid Panel[[id number]]     Collected: 03/26/2022 04:20 PM     Specimen Received: 03/26/2022 05:00 AM       Cholesterol, Total [001065] 247 mg/dL H 161-096 mg/dL  Triglycerides [045409] 85 mg/dL Normal 8-119 mg/dL  HDL Cholesterol [147829] 104 mg/dL Normal >56 mg/dL  VLDL Cholesterol Cal [213086] 14 mg/dL Normal 5-78 mg/dL  LDL Chol Calc (NIH) [469629] 129 mg/dL H 5-28 mg/dL  No results found for: "CHOL", "HDL", "LDLCALC", "LDLDIRECT", "TRIG", "CHOLHDL" She is intolerant to statins. On  Zetia 10 mg daily.  - last eye exam was in 04/22/2023: + DR reportedly.   - + numbness and tingling in her feet.  Last foot exam 01/21/2024.  She is on Neurontin 100 mg at bedtime.  She also has hypothyroidism - on levothyroxine 25 mcg daily, taken: - in am - fasting - at least 1h from b'fast - no calcium - no iron - prev. + multivitamins at lunchtime, now stopped - no PPIs - not on Biotin  Reviewed TSH levels: Lab Results  Component Value Date   TSH 3.68 11/30/2023   TSH 1.615 09/24/2022  03/17/2022: TSH 2.0  ROS: + See HPI  Past Medical History:  Diagnosis Date   Depression    Past Surgical History:  Procedure Laterality Date   ABDOMINAL HYSTERECTOMY     APPENDECTOMY     THYROIDECTOMY     Social History   Socioeconomic History   Marital status: Married    Spouse name: Geographical information systems officer   Number of children: 2   Years of education: 12   Highest education level: Not on file  Occupational History   Occupation: retired  Tobacco Use   Smoking status: Never   Smokeless tobacco: Never  Vaping Use   Vaping status: Never Used  Substance and Sexual Activity   Alcohol use: No   Drug use: No   Sexual activity: Not on file  Other Topics Concern   Not on file  Social History Narrative   Lives with Joelle Musca, husband   Caffeine use: Coffee daily   Right handed    Social Drivers of Corporate investment banker Strain: Not on file  Food Insecurity: Not on file  Transportation Needs: Not on file  Physical Activity: Not on file  Stress: Not on file  Social Connections: Not on file  Intimate Partner Violence: Not on file   Current Outpatient Medications on File Prior to Visit  Medication Sig Dispense Refill   Accu-Chek Softclix Lancets lancets Use as instructed 1x a day 100 each 3   donepezil  (ARICEPT ) 10 MG tablet TOME 1 TABLETA POR LA BOCA CADA  DIA 90 tablet 3   dorzolamide-timolol (COSOPT) 22.3-6.8 MG/ML ophthalmic solution Place 1 drop into both eyes 2 (two) times daily.      gabapentin (NEURONTIN) 100 MG capsule Take 100 mg by mouth at bedtime.     Glucagon  3 MG/DOSE POWD Place 3 mg into the nose once as needed for up to 1 dose. 2 each 11   glucose blood (ACCU-CHEK GUIDE) test strip 1 each by Other route 2 (two) times daily. And lancets 2/day 200 each 3   Insulin  Pen Needle 32G X 4 MM MISC Use 4x a day 300 each 3  latanoprost (XALATAN) 0.005 % ophthalmic solution Place 1 drop into both eyes at bedtime.     levothyroxine (SYNTHROID, LEVOTHROID) 25 MCG tablet Take 25 mcg by mouth daily before breakfast.     memantine  (NAMENDA ) 10 MG tablet TOME 1 TABLETA POR LA BOCA CADA  NOCHE POR 2 SEMANAS LUEGO 1  TABLETA POR LA BOCA DOS VECES AL DIA 180 tablet 3   repaglinide  (PRANDIN ) 1 MG tablet Take 1 tablet (1 mg total) by mouth 2 (two) times daily before a meal. 180 tablet 3   sertraline (ZOLOFT) 50 MG tablet Take 50 mg by mouth in the morning and at bedtime.     traZODone (DESYREL) 100 MG tablet Take 100 mg by mouth at bedtime.     TRESIBA  FLEXTOUCH 100 UNIT/ML FlexTouch Pen INJECT 10 UNITS SUBCUTANEOUSLY ONCE DAILY 9 mL 1   ZETIA 10 MG tablet 1 tablet Orally Once a day for 90 days     No current facility-administered medications on file prior to visit.   Allergies  Allergen Reactions   Atorvastatin Other (See Comments)    Reaction??   Other Rash and Other (See Comments)    A wrinkle cream (name not recalled) broke out the chest and face   Family History  Problem Relation Age of Onset   Dementia Sister    Diabetes Sister    Diabetes Mother    Diabetes Brother    PE: BP 118/60   Pulse 70   Ht 4\' 11"  (1.499 m)   Wt 94 lb 12.8 oz (43 kg)   SpO2 98%   BMI 19.15 kg/m  Wt Readings from Last 3 Encounters:  01/21/24 99 lb (44.9 kg)  12/09/23 97 lb 6.4 oz (44.2 kg)  11/16/23 101 lb (45.8 kg)   Constitutional: thin, in NAD Eyes: EOMI, no exophthalmos ENT: no thyromegaly, no cervical lymphadenopathy Cardiovascular: RRR, No MRG Respiratory: CTA  B Musculoskeletal: no deformities Skin: warm, no rashes Neurological: no tremor with outstretched hands  ASSESSMENT: 1. DM2, insulin -dependent, uncontrolled, with complications - DR  Component     Latest Ref Rng 08/20/2022  Glucose     65 - 99 mg/dL 324 (H)   Islet Cell Ab     Neg:<1:1  Negative   C-Peptide     0.80 - 3.85 ng/mL 1.04   ZNT8 Antibodies     <15 U/mL <10   Glutamic Acid Decarb Ab     <5 IU/mL <5   Glucose is too high to thoroughly interpret the C-peptide, however, it is encouraging that this is not suppressed.  The antipancreatic antibodies are not elevated.  2. HL  3.  Hypothyroidism  PLAN:  1. Patient with longstanding, uncontrolled type 2 diabetes, on basal insulin  and meglitinide, previously on basal/bolus insulin  regimen.  She had uncontrolled diabetes due to history of noncompliance with the recommended regimen and also early Alzheimer's dementia.  She gets confused with her regimen and usually takes different doses than recommended.  Patient's husband inquired about an insulin  pump at the previous visits but I explained that she was not a candidate for the pump due to the intricacies of managing it.   -At last visit, sugars appeared to be improving overnight but they were occasionally under 70s and increasing afterwards, with hyperglycemic spikes after breakfast and then again after dinner.  Some of the blood sugars were in the 300s after high carb meals.  She was avoiding to eat due to this.  It did appear that she needed mealtime  insulin  again but the patient and her husband wanted to try another medication first.  I recommended a meglitinide.  We discussed about how to take this and to change the dose depending on the size of her meals.  We continued the same dose of Tresiba .  HbA1c at that time was lower in a long time, at 6.6%. - We checked her for insulin  deficiency and antipancreatic autoimmunity and the investigation was negative. CGM interpretation: -At  today's visit, we reviewed her CGM downloads: It appears that 40% of values are in target range (goal >70%), while 60% are higher than 180 (goal <25%), and 0% are lower than 70 (goal <4%).  The calculated average blood sugar is 212.  The projected HbA1c for the next 3 months (GMI) is 8.4%. -Reviewing the CGM trends, sugars are quite high in the last 2 weeks, after they returned from 2 weeks abroad.  However, patient mentioned that sugars have been elevated and she actually switched from Prandin  to Humalog  as she felt the Prandin  was giving her more urinary tract infections.  We discussed that this is not a known side effect of Prandin , but she since she did not feel well on Prandin  and also since the sugars are very high, I suggested to go back to Humalog  taken before meals.  She now takes a fixed dose, 8 units before dinner and sugars after dinner are better but they increase drastically after her brunch.  We added Humalog  before this meal, also and we discussed about adjusting the dose.  I did showed him the CeQur simplicity device and they would be interested in this.  We would be wasting insulin , since the minimum feeling volume for 3 days is 100 units and she may not use more than 30 units for 3 days, but for now, they would still want to try this.  I sent a prescription for the pharmacy and advised him to let me know if this is approved, in which case I will refer them to the diabetes educator.   - I suggested to:  Patient Instructions  Please continue: - Tresiba  8 units daily  Please use Humalog : - 6-10 units before ALL meals  Continue Levothyroxine 25 mcg daily.  Take the thyroid  hormone every day, with water, at least 30 minutes before breakfast, separated by at least 4 hours from: - acid reflux medications - calcium - iron - multivitamins  Please return in 3-4 months.  - we checked her HbA1c: 6.9% (slightly higher) - advised to check sugars at different times of the day - 4x a day,  rotating check times - advised for yearly eye exams >> she is UTD - return to clinic in 3-4 months  2. HL - Reviewed latest lipid panel from 08/2023: LDL above target, otherwise fractions at goal No results found for: "CHOL", "HDL", "LDLCALC", "LDLDIRECT", "TRIG", "CHOLHDL" -She is intolerant to statins but she takes ezetimibe 10 mg daily with good tolerance.  3.  Hypothyroidism - latest thyroid  labs reviewed with pt. >> normal: Lab Results  Component Value Date   TSH 3.68 11/30/2023  - she continues on LT4 25 mcg daily - pt feels good on this dose. - we discussed about taking the thyroid  hormone every day, with water, >30 minutes before breakfast, separated by >4 hours from acid reflux medications, calcium, iron, multivitamins. Pt. is taking it correctly.  Emilie Harden, MD PhD [facility] Endocrinology

## 2024-04-03 NOTE — Patient Instructions (Addendum)
 Please continue: - Tresiba  8 units daily  Please use Humalog : - 6-10 units before ALL meals  Continue Levothyroxine 25 mcg daily.  Take the thyroid  hormone every day, with water, at least 30 minutes before breakfast, separated by at least 4 hours from: - acid reflux medications - calcium - iron - multivitamins  Please return in 3-4 months.

## 2024-04-04 ENCOUNTER — Telehealth: Payer: Self-pay

## 2024-04-04 NOTE — Telephone Encounter (Signed)
 Pt needs a PA for Cequr Pump

## 2024-04-06 ENCOUNTER — Ambulatory Visit: Admitting: Podiatry

## 2024-04-06 ENCOUNTER — Encounter: Payer: Self-pay | Admitting: Podiatry

## 2024-04-06 DIAGNOSIS — Z13228 Encounter for screening for other metabolic disorders: Secondary | ICD-10-CM | POA: Insufficient documentation

## 2024-04-06 DIAGNOSIS — E1142 Type 2 diabetes mellitus with diabetic polyneuropathy: Secondary | ICD-10-CM | POA: Diagnosis not present

## 2024-04-06 DIAGNOSIS — M62838 Other muscle spasm: Secondary | ICD-10-CM | POA: Diagnosis not present

## 2024-04-06 MED ORDER — GABAPENTIN 300 MG PO CAPS: 300.0000 mg | ORAL_CAPSULE | Freq: Every day | ORAL | 3 refills | Status: DC

## 2024-04-06 NOTE — Progress Notes (Signed)
 Subjective:  Patient ID: Haley Bartlett, female    DOB: March 12, 1951,  MRN: [id number] HPI Chief Complaint  Patient presents with   Diabetes    Diabetic foot exam - having cramping in legs and ankle at night time, getting more frequent, has to get up at night to walk it off, last A1c was 6.9, PCP rx'd gabapentin  100mg  at bedtime, but not helping, coldness in feet   New Patient (Initial Visit)    73 y.o. female presents with the above complaint.   ROS: Denies fever chills nausea vomit muscle aches pains calf pain back pain chest pain shortness of breath.  States that she gets cramps in her legs at night 3-4 times a night that actually caused her foot to rotate externally.  Past Medical History:  Diagnosis Date   Depression    Past Surgical History:  Procedure Laterality Date   ABDOMINAL HYSTERECTOMY     APPENDECTOMY     THYROIDECTOMY      Current Outpatient Medications:    gabapentin  (NEURONTIN ) 300 MG capsule, Take 1 capsule (300 mg total) by mouth at bedtime., Disp: 30 capsule, Rfl: 3   ipratropium (ATROVENT) 0.03 % nasal spray, Place 2 sprays into both nostrils 2 (two) times daily., Disp: , Rfl:    repaglinide  (PRANDIN ) 2 MG tablet, Take 2 mg by mouth., Disp: , Rfl:    Accu-Chek Softclix Lancets lancets, Use as instructed 1x a day, Disp: 100 each, Rfl: 3   donepezil  (ARICEPT ) 10 MG tablet, TOME 1 TABLETA POR LA BOCA CADA  DIA, Disp: 90 tablet, Rfl: 3   dorzolamide-timolol (COSOPT) 22.3-6.8 MG/ML ophthalmic solution, Place 1 drop into both eyes 2 (two) times daily., Disp: , Rfl:    Glucagon  3 MG/DOSE POWD, Place 3 mg into the nose once as needed for up to 1 dose., Disp: 2 each, Rfl: 11   glucose blood (ACCU-CHEK GUIDE) test strip, 1 each by Other route 2 (two) times daily. And lancets 2/day, Disp: 200 each, Rfl: 3   Injection Device for Insulin  (CEQUR SIMPLICITY INSERTER) MISC, Use as advised, Disp: 1 each, Rfl: 0   insulin  lispro (HUMALOG ) 100 UNIT/ML injection, Use up to  33 units a day in the insulin  pump as advised, Disp: 30 mL, Rfl: 3   Insulin  Pen Needle 32G X 4 MM MISC, Use 4x a day, Disp: 300 each, Rfl: 3   latanoprost (XALATAN) 0.005 % ophthalmic solution, Place 1 drop into both eyes at bedtime., Disp: , Rfl:    levothyroxine (SYNTHROID, LEVOTHROID) 25 MCG tablet, Take 25 mcg by mouth daily before breakfast., Disp: , Rfl:    memantine  (NAMENDA ) 10 MG tablet, TOME 1 TABLETA POR LA BOCA CADA  NOCHE POR 2 SEMANAS LUEGO 1  TABLETA POR LA BOCA DOS VECES AL DIA, Disp: 180 tablet, Rfl: 3   sertraline (ZOLOFT) 50 MG tablet, Take 50 mg by mouth in the morning and at bedtime., Disp: , Rfl:    traZODone (DESYREL) 100 MG tablet, Take 100 mg by mouth at bedtime., Disp: , Rfl:    TRESIBA  FLEXTOUCH 100 UNIT/ML FlexTouch Pen, INJECT 10 UNITS SUBCUTANEOUSLY ONCE DAILY, Disp: 9 mL, Rfl: 1   ZETIA 10 MG tablet, 1 tablet Orally Once a day for 90 days, Disp: , Rfl:   Allergies  Allergen Reactions   Atorvastatin Other (See Comments)    Reaction??   Other Rash and Other (See Comments)    A wrinkle cream (name not recalled) broke out the chest and face  Review of Systems Objective:  There were no vitals filed for this visit.  General: Well developed, nourished, in no acute distress, alert and oriented x3   Dermatological: Skin is warm, dry and supple bilateral. Nails x 10 are well maintained; remaining integument appears unremarkable at this time. There are no open sores, no preulcerative lesions, no rash or signs of infection present.  Vascular: Dorsalis Pedis artery and Posterior Tibial artery pedal pulses are 2/4 bilateral with immedate capillary fill time. Pedal hair growth present. No varicosities and no lower extremity edema present bilateral.   Neruologic: Grossly intact via light touch bilateral. Vibratory intact via tuning fork bilateral. Protective threshold with Semmes Wienstein monofilament intact to all pedal sites bilateral. Patellar and Achilles deep tendon  reflexes 2+ bilateral. No Babinski or clonus noted bilateral.   Musculoskeletal: No gross boney pedal deformities bilateral. No pain, crepitus, or limitation noted with foot and ankle range of motion bilateral. Muscular strength 5/5 in all groups tested bilateral.  Gait: Unassisted, Nonantalgic.    Radiographs:  None taken  Assessment & Plan:   Assessment: Diabetes mellitus with diabetic peripheral neuropathy and muscle spasms nocturnal.  Plan: Discussed etiology pathology conservative surgical therapies recommended that she start a daily 0-calorie electrolyte replacement regimen such as Gatorade or Powerade.  Were also going to increase her slowly from 100 mg to 200 mg of gabapentin  at nighttime and then we will send a prescription in for 300 mg of gabapentin  1 at nighttime.  I would like to follow-up with her in 1 month     Aariona Momon T. [place], [place]

## 2024-04-10 ENCOUNTER — Telehealth: Payer: Self-pay

## 2024-04-10 ENCOUNTER — Other Ambulatory Visit (HOSPITAL_COMMUNITY): Payer: Self-pay

## 2024-04-10 NOTE — Telephone Encounter (Signed)
 Pharmacy Patient Advocate Encounter   Received notification from Pt Calls Messages that prior authorization for CeQur Simplicity 2U is required/requested.   Insurance verification completed.   The patient is insured through [facility] .   Per test claim: PA required; PA submitted to above mentioned insurance via CoverMyMeds Key/confirmation #/EOC [id number] Status is pending

## 2024-04-11 NOTE — Telephone Encounter (Signed)
 Insurance is requesting additional info:

## 2024-04-11 NOTE — Telephone Encounter (Signed)
 Let's appeal: she is using low doses of basal insulin  than allowed  by the [employer]: - Tresiba  8 units daily - Humalog  6-10 units before meals The [employer] reservoirs require the patient to use at least 20 units of basal insulin  a day.

## 2024-04-11 NOTE — Telephone Encounter (Signed)
 ERROR

## 2024-04-12 ENCOUNTER — Other Ambulatory Visit: Payer: Self-pay

## 2024-04-12 MED ORDER — INSULIN LISPRO (1 UNIT DIAL) 100 UNIT/ML (KWIKPEN): 6.0000 [IU] | PEN_INJECTOR | Freq: Three times a day (TID) | SUBCUTANEOUS | 0 refills | Status: DC

## 2024-04-17 ENCOUNTER — Telehealth: Payer: Self-pay | Admitting: Pharmacist

## 2024-04-17 NOTE — Telephone Encounter (Signed)
 Appeal has been submitted for [employer]. Will advise when response is received, please be advised that most companies may take 30 days to make a decision. Appeal letter and supporting documentation have been faxed to [phone] on 04/17/2024 @2 :58 pm.  Thank you, Dene Fines, PharmD Clinical Pharmacist  [place]  Direct Dial : [phone]

## 2024-04-26 ENCOUNTER — Other Ambulatory Visit (HOSPITAL_COMMUNITY): Payer: Self-pay

## 2024-04-26 NOTE — Telephone Encounter (Signed)
 The appeal for CeQur Simplicity has been approved by the insurance through 12/06/2024.  Thank you, Dene Fines, PharmD Clinical Pharmacist  [place]  Direct Dial : [phone]

## 2024-04-27 NOTE — Telephone Encounter (Signed)
 LMTRC

## 2024-05-09 ENCOUNTER — Ambulatory Visit: Admitting: Podiatry

## 2024-05-09 DIAGNOSIS — E1165 Type 2 diabetes mellitus with hyperglycemia: Secondary | ICD-10-CM | POA: Diagnosis not present

## 2024-05-30 ENCOUNTER — Other Ambulatory Visit: Payer: Self-pay

## 2024-05-30 ENCOUNTER — Emergency Department (HOSPITAL_COMMUNITY)

## 2024-05-30 ENCOUNTER — Emergency Department (HOSPITAL_COMMUNITY)
Admission: EM | Admit: 2024-05-30 | Discharge: 2024-05-30 | Disposition: A | Discharge status: Home / Self Care | Attending: Emergency Medicine | Admitting: Emergency Medicine

## 2024-05-30 DIAGNOSIS — Z794 Long term (current) use of insulin: Secondary | ICD-10-CM | POA: Insufficient documentation

## 2024-05-30 DIAGNOSIS — M19011 Primary osteoarthritis, right shoulder: Secondary | ICD-10-CM | POA: Diagnosis not present

## 2024-05-30 DIAGNOSIS — M7918 Myalgia, other site: Secondary | ICD-10-CM | POA: Insufficient documentation

## 2024-05-30 DIAGNOSIS — R0789 Other chest pain: Secondary | ICD-10-CM | POA: Diagnosis not present

## 2024-05-30 DIAGNOSIS — M791 Myalgia, unspecified site: Secondary | ICD-10-CM | POA: Diagnosis not present

## 2024-05-30 DIAGNOSIS — I771 Stricture of artery: Secondary | ICD-10-CM | POA: Diagnosis not present

## 2024-05-30 DIAGNOSIS — M5412 Radiculopathy, cervical region: Secondary | ICD-10-CM | POA: Diagnosis not present

## 2024-05-30 DIAGNOSIS — R0781 Pleurodynia: Secondary | ICD-10-CM | POA: Diagnosis not present

## 2024-05-30 DIAGNOSIS — M25511 Pain in right shoulder: Secondary | ICD-10-CM | POA: Diagnosis not present

## 2024-05-30 LAB — BASIC METABOLIC PANEL WITH GFR
Anion gap: 9 (ref 5–15)
BUN: 26 mg/dL — ABNORMAL HIGH (ref 8–23)
CO2: 23 mmol/L (ref 22–32)
Calcium: 8.9 mg/dL (ref 8.9–10.3)
Chloride: 105 mmol/L (ref 98–111)
Creatinine, Ser: 1.06 mg/dL — ABNORMAL HIGH (ref 0.44–1.00)
GFR, Estimated: 56 mL/min — ABNORMAL LOW (ref >60)
Glucose, Bld: 251 mg/dL — ABNORMAL HIGH (ref 70–99)
Potassium: 4.3 mmol/L (ref 3.5–5.1)
Sodium: 137 mmol/L (ref 135–145)

## 2024-05-30 LAB — CBC
HCT: 42.2 % (ref 36.0–46.0)
Hemoglobin: 13.9 g/dL (ref 12.0–15.0)
MCH: 32.4 pg (ref 26.0–34.0)
MCHC: 32.9 g/dL (ref 30.0–36.0)
MCV: 98.4 fL (ref 80.0–100.0)
Platelets: 184 10*3/uL (ref 150–400)
RBC: 4.29 MIL/uL (ref 3.87–5.11)
RDW: 12.3 % (ref 11.5–15.5)
WBC: 6.7 10*3/uL (ref 4.0–10.5)
nRBC: 0 % (ref 0.0–0.2)

## 2024-05-30 LAB — TROPONIN I (HIGH SENSITIVITY): Troponin I (High Sensitivity): 2 ng/L (ref <18)

## 2024-05-30 LAB — D-DIMER, QUANTITATIVE: D-Dimer, Quant: <0.27 ug{FEU}/mL (ref 0.00–0.50)

## 2024-05-30 MED ORDER — PREDNISONE 10 MG PO TABS: 10.0000 mg | ORAL_TABLET | Freq: Every day | ORAL | 0 refills | Status: AC

## 2024-05-30 NOTE — Discharge Instructions (Signed)
 The pain in the arm may be coming from the neck or the shoulder area.  You have been given 3 days of a relatively low-dose of steroids.  Keep an eye on your sugars.  Follow-up with your doctor as needed.

## 2024-05-30 NOTE — ED Triage Notes (Addendum)
 Pt has c/o chest pain, right arm pain/heaviness, SHOB since Friday. Pt was traveling by plane from [place] and symptoms began then.

## 2024-05-30 NOTE — ED Provider Notes (Signed)
 [place] EMERGENCY DEPARTMENT AT [facility] Provider Note   CSN: [id number] Arrival date & time: 05/30/24  1344     Patient presents with: Chest Pain and Shortness of Breath   Haley Bartlett is a 73 y.o. female.    Chest Pain Associated symptoms: shortness of breath   Shortness of Breath Associated symptoms: chest pain   Patient with chest pain and shortness of breath.  Began on Friday while flying to [facility] [place] .  Had shot of steroids there.  Pain is in the right arm worse with movements.  Then also had some chest pain.  No fever or chills.  No coughing.  States there was pain in her left leg and sometimes at the right leg.  Family translates for patient.  Education administrator is refused with    Past Medical History:  Diagnosis Date   Depression     Prior to Admission medications   Medication Sig Start Date End Date Taking? Authorizing Provider  predniSONE  (DELTASONE ) 10 MG tablet Take 1 tablet (10 mg total) by mouth daily. 05/30/24  Yes Patsey Lot, MD  Accu-Chek Softclix Lancets lancets Use as instructed 1x a day 01/15/23   Trixie File, MD  donepezil  (ARICEPT ) 10 MG tablet TOME 1 TABLETA POR LA BOCA CADA  DIA 07/28/23   Wertman, Sara E, PA-C  dorzolamide-timolol (COSOPT) 22.3-6.8 MG/ML ophthalmic solution Place 1 drop into both eyes 2 (two) times daily.    [provider]  gabapentin  (NEURONTIN ) 300 MG capsule Take 1 capsule (300 mg total) by mouth at bedtime. 04/06/24   Hyatt, Max T, DPM  Glucagon  3 MG/DOSE POWD Place 3 mg into the nose once as needed for up to 1 dose. 02/08/23   Trixie File, MD  glucose blood (ACCU-CHEK GUIDE) test strip 1 each by Other route 2 (two) times daily. And lancets 2/day 02/18/21   Kassie Mallick, MD  Injection Device for Insulin  [facility] SIMPLICITY INSERTER) MISC Use as advised 04/03/24   Trixie File, MD  insulin  lispro (HUMALOG  KWIKPEN) 100 UNIT/ML KwikPen Inject 6-10 Units into the skin 3 (three) times  daily. 04/12/24   Trixie File, MD  insulin  lispro (HUMALOG ) 100 UNIT/ML injection Use up to 33 units a day in the insulin  pump as advised 04/03/24   Trixie File, MD  Insulin  Pen Needle 32G X 4 MM MISC Use 4x a day 09/15/22   Gherghe, Cristina, MD  ipratropium (ATROVENT) 0.03 % nasal spray Place 2 sprays into both nostrils 2 (two) times daily. 02/17/24   [provider]  latanoprost (XALATAN) 0.005 % ophthalmic solution Place 1 drop into both eyes at bedtime.    [provider]  levothyroxine (SYNTHROID, LEVOTHROID) 25 MCG tablet Take 25 mcg by mouth daily before breakfast.    [provider]  memantine  (NAMENDA ) 10 MG tablet TOME 1 TABLETA POR LA BOCA CADA  NOCHE POR 2 SEMANAS LUEGO 1  TABLETA POR LA BOCA DOS VECES AL DIA 07/28/23   Dina, Camie BRAVO, PA-C  repaglinide  (PRANDIN ) 2 MG tablet Take 2 mg by mouth. 09/15/21   [provider]  sertraline (ZOLOFT) 50 MG tablet Take 50 mg by mouth in the morning and at bedtime. 08/27/22   [provider]  traZODone (DESYREL) 100 MG tablet Take 100 mg by mouth at bedtime.    [provider]  TRESIBA  FLEXTOUCH 100 UNIT/ML FlexTouch Pen INJECT 10 UNITS SUBCUTANEOUSLY ONCE DAILY 03/28/24   Trixie File, MD  ZETIA 10 MG tablet 1 tablet  Orally Once a day for 90 days    [provider]    Allergies: Atorvastatin, Atorvastatin calcium, Pravastatin, and Other    Review of Systems  Respiratory:  Positive for shortness of breath.   Cardiovascular:  Positive for chest pain.    Updated Vital Signs BP 125/68   Pulse 73   Temp 97.9 F (36.6 C) (Oral)   Resp 16   SpO2 99%   Physical Exam Vitals and nursing note reviewed.  HENT:     Head: Atraumatic.   Cardiovascular:     Rate and Rhythm: Regular rhythm.  Pulmonary:     Breath sounds: No decreased breath sounds or wheezing.  Chest:     Chest wall: No tenderness.  Abdominal:     Tenderness: There is no abdominal tenderness.    Musculoskeletal:     Right lower leg: No edema.     Left lower leg: No edema.     Comments: Slight tenderness to right calf.  Tenderness over right trapezius.  Some pain with movement but right shoulder.  Strong radial pulse.  Sensation intact in upper extremities.  Sensation tact in lower extremities.   Skin:    General: Skin is warm.     Capillary Refill: Capillary refill takes less than 2 seconds.   Neurological:     Mental Status: She is alert.     (all labs ordered are listed, but only abnormal results are displayed) Labs Reviewed  BASIC METABOLIC PANEL WITH GFR - Abnormal; Notable for the following components:      Result Value   Glucose, Bld 251 (*)    BUN 26 (*)    Creatinine, Ser 1.06 (*)    GFR, Estimated 56 (*)    All other components within normal limits  CBC  D-DIMER, QUANTITATIVE  TROPONIN I (HIGH SENSITIVITY)  TROPONIN I (HIGH SENSITIVITY)    EKG: EKG Interpretation Date/Time:  Tuesday May 30 2024 13:53:16 EDT Ventricular Rate:  71 PR Interval:  174 QRS Duration:  85 QT Interval:  361 QTC Calculation: 393 R Axis:   49  Text Interpretation: Sinus rhythm Low voltage, precordial leads Confirmed by Patsey Lot [phone]) on 05/30/2024 4:00:55 PM  Radiology: ARCOLA Shoulder Right Result Date: 05/30/2024 CLINICAL DATA:  Right shoulder pain. EXAM: RIGHT SHOULDER - 2+ VIEW COMPARISON:  None Available. FINDINGS: There is no evidence of fracture or dislocation. Mild degenerative changes are seen involving the right acromioclavicular and glenohumeral joints. Soft tissues are unremarkable. IMPRESSION: Mild degenerative joint disease as noted above. No acute abnormality seen. Electronically Signed   By: Lynwood Landy Raddle M.D.   On: 05/30/2024 15:25   DG Chest 2 View Result Date: 05/30/2024 CLINICAL DATA:  Pleuritic chest pain EXAM: CHEST - 2 VIEW COMPARISON:  Chest x-ray 09/24/2022 FINDINGS: No consolidation, pneumothorax or effusion. No edema. Normal  cardiopericardial silhouette. Tortuous ectatic aorta. Overlapping artifacts from patient's clothing. Surgical clips in the upper abdomen. Air-fluid level along the stomach. IMPRESSION: No acute cardiopulmonary disease. Electronically Signed   By: Ranell Bring M.D.   On: 05/30/2024 15:19     Procedures   Medications Ordered in the ED - No data to display                                  Medical Decision Making  Patient with right upper extremity pain.  Also some pain to the chest.  Has had now for  around 5 days.  Had been seen in [place]  for the same and had a steroid shot.  Differential diagnosis does include cause such as cardiac cause although EKG and troponin negative.  Chest x-ray reassuring.  Negative D-dimer.  Doubt pulmonary embolism.  Workup reassuring.  Sugar mildly elevated.  With radiation down the arm and tenderness over the right trapezius potentially musculoskeletal.  Appears stable to discharge home however.     Final diagnoses:  Musculoskeletal pain    ED Discharge Orders          Ordered    predniSONE  (DELTASONE ) 10 MG tablet  Daily        05/30/24 1629               Patsey Lot, MD 05/30/24 1630

## 2024-05-30 NOTE — ED Provider Triage Note (Signed)
 Emergency Medicine Provider Triage Evaluation Note  Haley Bartlett , a 73 y.o. female  was evaluated in triage.  Pt complains of chest pain, right arm pain.  Patient's family member is acting as a Nurse, learning disability for her.  Reports that symptoms began on Friday when they were on a plane headed to [place] , she was seen by a doctor in [place]  who gave her a cortisone injection for the pain in her right arm, however her symptoms persist.  She describes anterior chest pain that is worse with taking deep breaths, describes this as sharp.  No nausea, vomiting, abdominal pain, known injury/inciting event.  Review of Systems  Positive: As above Negative: As above  Physical Exam  BP 122/80 (BP Location: Right Arm)   Pulse 73   Temp 97.9 F (36.6 C) (Oral)   Resp 16   SpO2 99%  Gen:   Awake, no distress   Resp:  Normal effort  MSK:   Moves extremities without difficulty  Other:  Right shoulder pain elicited with overhead reach  Medical Decision Making  Medically screening exam initiated at 2:05 PM.  Appropriate orders placed.  Kaity Pitstick Bugh was informed that the remainder of the evaluation will be completed by another provider, this initial triage assessment does not replace that evaluation, and the importance of remaining in the ED until their evaluation is complete.     Glendia Rocky SAILOR, [place] 05/30/24 1406

## 2024-05-31 ENCOUNTER — Other Ambulatory Visit: Payer: Self-pay | Admitting: Physician Assistant

## 2024-06-05 DIAGNOSIS — E1165 Type 2 diabetes mellitus with hyperglycemia: Secondary | ICD-10-CM | POA: Diagnosis not present

## 2024-06-05 DIAGNOSIS — H401131 Primary open-angle glaucoma, bilateral, mild stage: Secondary | ICD-10-CM | POA: Diagnosis not present

## 2024-06-05 DIAGNOSIS — E782 Mixed hyperlipidemia: Secondary | ICD-10-CM | POA: Diagnosis not present

## 2024-06-05 DIAGNOSIS — G300 Alzheimer's disease with early onset: Secondary | ICD-10-CM | POA: Diagnosis not present

## 2024-06-15 ENCOUNTER — Other Ambulatory Visit: Payer: Self-pay | Admitting: Podiatry

## 2024-06-19 ENCOUNTER — Ambulatory Visit: Admitting: Internal Medicine

## 2024-06-29 ENCOUNTER — Other Ambulatory Visit: Payer: Self-pay | Admitting: Internal Medicine

## 2024-07-06 ENCOUNTER — Ambulatory Visit: Admitting: Internal Medicine

## 2024-07-06 ENCOUNTER — Encounter: Payer: Self-pay | Admitting: Internal Medicine

## 2024-07-06 VITALS — BP 110/60 | HR 79 | Ht 59.0 in | Wt 99.0 lb

## 2024-07-06 DIAGNOSIS — E782 Mixed hyperlipidemia: Secondary | ICD-10-CM | POA: Diagnosis not present

## 2024-07-06 DIAGNOSIS — E785 Hyperlipidemia, unspecified: Secondary | ICD-10-CM

## 2024-07-06 DIAGNOSIS — E119 Type 2 diabetes mellitus without complications: Secondary | ICD-10-CM | POA: Diagnosis not present

## 2024-07-06 DIAGNOSIS — Z794 Long term (current) use of insulin: Secondary | ICD-10-CM

## 2024-07-06 DIAGNOSIS — E1165 Type 2 diabetes mellitus with hyperglycemia: Secondary | ICD-10-CM | POA: Diagnosis not present

## 2024-07-06 DIAGNOSIS — G300 Alzheimer's disease with early onset: Secondary | ICD-10-CM | POA: Diagnosis not present

## 2024-07-06 DIAGNOSIS — E039 Hypothyroidism, unspecified: Secondary | ICD-10-CM

## 2024-07-06 DIAGNOSIS — H401131 Primary open-angle glaucoma, bilateral, mild stage: Secondary | ICD-10-CM | POA: Diagnosis not present

## 2024-07-06 NOTE — Progress Notes (Signed)
 Patient ID: Haley Bartlett, female   DOB: August 04, 1951, 73 y.o.   MRN: [id number]  HPI: Haley Bartlett is a 73 y.o.-year-old female, returning for follow-up for DM2, dx in 1993, on insulin  since 2002, uncontrolled, with complications (DR).  She previously saw Dr. Kassie, last visit with me 2.5 months ago. She is accompanied by her husband who translates for us  and offers most of the history.   Interim history: No increased urination, nausea, chest pain.  She has some blurry vision-will see ophthalmology next week. She c/o muscle cramps in her feet.  She is trying to stay well-hydrated. Since last visit, CeQur insulin  pump was approved, however, this was very expensive: $1500 for 3 months.  Reviewed HbA1c: 05/31/2024: HbA1c 6.9% Lab Results  Component Value Date   HGBA1C 6.9 (A) 04/03/2024   HGBA1C 6.6 (A) 01/21/2024   HGBA1C 8.3 (A) 08/06/2023   HGBA1C 7.9% 01/15/2023   HGBA1C 8.6 (A) 04/17/2022   HGBA1C 8.5 (A) 01/08/2022   HGBA1C 8.2 (A) 09/30/2021   HGBA1C 7.8 (A) 08/26/2021   HGBA1C 6.9 (A) 04/22/2021   HGBA1C 6.9 (A) 02/18/2021  11/30/2023: HbA1c 7.4% 01/15/2023: HbA1c 7.9% 07/30/2022: HbA1c 7.7% 03/26/2022: HbA1c 9.0%, C-peptide 1.6, glucose 217 08/13/2010: GAD antibody <1, Glu 164, C peptide 1.1  She was previously on: - Metformin  500 mg 2x a day - Prandin  1 mg 3 times a day before meals - Actos  30 mg before breakfast - Farxiga  10 mg before breakfast - Humalog  11 units before the 3 meals  - only if sugars >300 She was taken off insulin  but restarted 01/2022. Januvia  and Rybelsus  were too expensive.  At last visit she was not taking the recommended regimen: - Metformin  500 mg 2x a day >> not taken since last OV 2/2 UTIs - Tresiba  10 >> she decreased to 6 units daily 2/2 lows - Humalog  4-5  units 15 min before each meal and increase the dose as needed >> 6 units after dinner  She was previously confused about her regimen and was using different doses than  recommended: - Tresiba  6 units daily >> 10-12 >> 10 units daily >> but using only 5 units of Tresiba  - Humalog  7-8 units 3-4x a day >> 10 min before EACH MEAL 9 units before b'fast >> but still using 8 units 4 units before lunch >> still not injecting 4-5 units before dinner >> but still using 10 units  I then  recommended the following regimen: - Tresiba  10 units daily - Humalog  15 min before EACH MEAL 9 units before b'fast 4 units before lunch 4-5 units before dinner  She then came off Humalog  and she was on: - Tresiba  8 >> 10 units daily I also recommended: - Humalog  8 units before D - started 3 weeks prior to our appointment from 03/2024 >>  >> still using 4-5 units before dinner only She previously came off Prandin  reportedly due to UTIs.  Pt checks her sugars >4x a day and they are -with receiver:  Previously:  Prev.:  Lowest sugar was 53 >> 60s; she has hypoglycemia awareness at 70.  Highest sugar was 400s >> 390 >> HI. She was not emergency room with hypoglycemia (09/2022).  She was found at home on the floor, by her daughter. She took her insulin , but did not eat lunch, which is unusual for her.  Glucose was 26 on Dexcom.  She did not hear the alarm.  It was noted that she was using higher doses of  insulin  than recommended around the time of the episode.  Glucometer: Accu-Chek guide  Meals: - b'fast: veggies + [place] sausage x2 - lunch: orange - dinner: chicken soup, meat, veggies  - last BUN/creatinine:  Lab Results  Component Value Date   BUN 26 (H) 05/30/2024   BUN 12 09/24/2022   CREATININE 1.06 (H) 05/30/2024   CREATININE 0.83 09/24/2022   Lab Results  Component Value Date   MICRALBCREAT 3 12/09/2023   She is not on ACE inhibitor/ARB.  -+ HL; last set of lipids: 09/06/2023: 230/80/84/133 Lipid Panel[[id number]]     Collected: 03/26/2022 04:20 PM     Specimen Received: 03/26/2022 05:00 AM       Cholesterol, Total [001065] 247 mg/dL H 899-800 mg/dL   Triglycerides [998827] 85 mg/dL Normal 9-850 mg/dL  HDL Cholesterol [988182] 104 mg/dL Normal >60 mg/dL  VLDL Cholesterol Cal [988080] 14 mg/dL Normal 4-59 mg/dL  LDL Chol Calc (NIH) [987940] 129 mg/dL H 9-00 mg/dL  No results found for: CHOL, HDL, LDLCALC, LDLDIRECT, TRIG, CHOLHDL She is intolerant to statins. On Zetia 10 mg daily.  - last eye exam was in 04/22/2023: + DR reportedly.   - + numbness and tingling in her feet.  Last foot exam 01/21/2024.  She was rec'd Neurontin  100 mg at bedtime (prescribed by podiatry)- stopped 2/2 memory pbs.  She also has hypothyroidism - on levothyroxine 25 mcg daily, taken: - in am - fasting - at least 1h from b'fast - no calcium - no iron - prev. + multivitamins at lunchtime, now stopped - no PPIs - not on Biotin  Reviewed TSH levels: Lab Results  Component Value Date   TSH 3.68 11/30/2023   TSH 1.615 09/24/2022  03/17/2022: TSH 2.0  ROS: + See HPI  Past Medical History:  Diagnosis Date   Depression    Past Surgical History:  Procedure Laterality Date   ABDOMINAL HYSTERECTOMY     APPENDECTOMY     THYROIDECTOMY     Social History   Socioeconomic History   Marital status: Married    Spouse name: Geographical information systems officer   Number of children: 2   Years of education: 12   Highest education level: Not on file  Occupational History   Occupation: retired  Tobacco Use   Smoking status: Never   Smokeless tobacco: Never  Vaping Use   Vaping status: Never Used  Substance and Sexual Activity   Alcohol use: No   Drug use: No   Sexual activity: Not on file  Other Topics Concern   Not on file  Social History Narrative   Lives with Fredderick, husband   Caffeine use: Coffee daily   Right handed    Social Drivers of Corporate investment banker Strain: Not on file  Food Insecurity: Not on file  Transportation Needs: Not on file  Physical Activity: Not on file  Stress: Not on file  Social Connections: Not on file  Intimate Partner  Violence: Not on file   Current Outpatient Medications on File Prior to Visit  Medication Sig Dispense Refill   Accu-Chek Softclix Lancets lancets Use as instructed 1x a day 100 each 3   donepezil  (ARICEPT ) 10 MG tablet TOME 1 TABLETA POR LA BOCA CADA  DIA 100 tablet 2   dorzolamide-timolol (COSOPT) 22.3-6.8 MG/ML ophthalmic solution Place 1 drop into both eyes 2 (two) times daily.     gabapentin  (NEURONTIN ) 300 MG capsule TOME 1 CAPSULA POR LA BOCA AL  ACOSTARSE 100 capsule 2   Glucagon   3 MG/DOSE POWD Place 3 mg into the nose once as needed for up to 1 dose. 2 each 11   glucose blood (ACCU-CHEK GUIDE) test strip 1 each by Other route 2 (two) times daily. And lancets 2/day 200 each 3   Injection Device for Insulin  (CEQUR SIMPLICITY INSERTER) MISC Use as advised 1 each 0   Insulin  Disposable Pump (OMNIPOD DASH PODS, GEN 4,) MISC USE AS INSTRUCTED TO ADMINISTER INSULIN , CHANGE EVERY 3 DAYS 10 each 11   insulin  lispro (HUMALOG  KWIKPEN) 100 UNIT/ML KwikPen Inject 6-10 Units into the skin 3 (three) times daily. 15 mL 0   insulin  lispro (HUMALOG ) 100 UNIT/ML injection Use up to 33 units a day in the insulin  pump as advised 30 mL 3   Insulin  Pen Needle 32G X 4 MM MISC Use 4x a day 300 each 3   ipratropium (ATROVENT) 0.03 % nasal spray Place 2 sprays into both nostrils 2 (two) times daily.     latanoprost (XALATAN) 0.005 % ophthalmic solution Place 1 drop into both eyes at bedtime.     levothyroxine (SYNTHROID, LEVOTHROID) 25 MCG tablet Take 25 mcg by mouth daily before breakfast.     memantine  (NAMENDA ) 10 MG tablet TOME 1 TABLETA POR LA BOCA CADA  NOCHE POR 2 SEMANAS LUEGO 1  TABLETA POR LA BOCA DOS VECES AL DIA 200 tablet 2   predniSONE  (DELTASONE ) 10 MG tablet Take 1 tablet (10 mg total) by mouth daily. 3 tablet 0   repaglinide  (PRANDIN ) 2 MG tablet Take 2 mg by mouth.     sertraline (ZOLOFT) 50 MG tablet Take 50 mg by mouth in the morning and at bedtime.     traZODone (DESYREL) 100 MG tablet Take  100 mg by mouth at bedtime.     TRESIBA  FLEXTOUCH 100 UNIT/ML FlexTouch Pen INJECT 10 UNITS SUBCUTANEOUSLY ONCE DAILY 9 mL 1   ZETIA 10 MG tablet 1 tablet Orally Once a day for 90 days     No current facility-administered medications on file prior to visit.   Allergies  Allergen Reactions   Atorvastatin Other (See Comments)    Reaction??   Atorvastatin Calcium Dermatitis   Pravastatin     Other Reaction(s): muscle aches   Other Rash and Other (See Comments)    A wrinkle cream (name not recalled) broke out the chest and face   Family History  Problem Relation Age of Onset   Dementia Sister    Diabetes Sister    Diabetes Mother    Diabetes Brother    PE: BP 110/60   Pulse 79   Ht 4' 11 (1.499 m)   Wt 99 lb (44.9 kg)   SpO2 98%   BMI 20.00 kg/m  Wt Readings from Last 3 Encounters:  07/06/24 99 lb (44.9 kg)  04/03/24 94 lb 12.8 oz (43 kg)  01/21/24 99 lb (44.9 kg)   Constitutional: thin, in NAD Eyes: EOMI, no exophthalmos ENT: no thyromegaly, no cervical lymphadenopathy Cardiovascular: RRR, No MRG Respiratory: CTA B Musculoskeletal: no deformities Skin: warm, no rashes Neurological: no tremor with outstretched hands  ASSESSMENT: 1. DM2, insulin -dependent, uncontrolled, with complications - DR  Component     Latest Ref Rng 08/20/2022  Glucose     65 - 99 mg/dL 729 (H)   Islet Cell Ab     Neg:<1:1  Negative   C-Peptide     0.80 - 3.85 ng/mL 1.04   ZNT8 Antibodies     <15 U/mL <10   Glutamic  Acid Decarb Ab     <5 IU/mL <5   Glucose is too high to thoroughly interpret the C-peptide, however, it is encouraging that this is not suppressed.  The antipancreatic antibodies are not elevated.  2. HL  3.  Hypothyroidism  PLAN:  1. Patient with longstanding, uncontrolled, type 2 diabetes, on basal insulin  and meglitinide previously, but back on bolus insulin  more consistently since last visit.  At that time, sugars were high in the previous 2 weeks, after she and  her husband returned from 2 weeks abroad.  She felt that sugars were actually higher when switched from Prandin  to Humalog  but she felt the Prandin  was giving her more urinary tract infections.  I recommended to continue bolusing Humalog  but to vary the dose depending on the size of her meals and to take it before each meal.  I also recommended the CeQur simplicity insulin  pump.  This was initially declined by her insurance and the V-Go pump was suggested.  We appealed the decision since she was on a low-dose of basal insulin  which did not qualify her for V-Go use.  The appeal was approved.  However, the pump turned out to be very expensive, $500 a month, so she was not able to start it - We checked her for insulin  deficiency and antipancreatic autoimmunity and the investigation was negative. -Latest HbA1c from 1 month ago was stable, at 6.9%, at goal, however, this is much lower than expected from her blood sugars at home CGM interpretation: -At today's visit, we reviewed her CGM downloads: It appears that 44% of values are in target range (goal >70%), while 55% are higher than 180 (goal <25%), and 1% are lower than 70 (goal <4%).  The calculated average blood sugar is 196.  The projected HbA1c for the next 3 months (GMI) is 8.0%. -Reviewing the CGM trends, sugars continue to remain quite high, increasing particularly after lunch and then again after dinner, but remaining elevated mostly throughout the day.  Upon questioning, she is still not using Humalog  before every meal, but only before dinner.  Also, she is not using the recommended dose, but lower doses.  We discussed that until she takes Humalog  before lunch and before dinner at least, we would not be able to control her diabetes.  I also recommended to use a higher dose. - I suggested to:  Patient Instructions  Please continue: - Tresiba  10 units daily  Please try to take: - Humalog  6-10 units before ALL meals  Continue Levothyroxine 25 mcg  daily.  Take the thyroid  hormone every day, with water, at least 30 minutes before breakfast, separated by at least 4 hours from: - acid reflux medications - calcium - iron - multivitamins  Can try the following combination for neuropathy: - alpha-lipoic acid 600 mg twice a day  Please return in 3-4 months.  - advised to check sugars at different times of the day - 4x a day, rotating check times - advised for yearly eye exams >> she is UTD -She complains of muscle cramps in her feet.  She also mentions she has neuropathy.  She saw podiatry.  She was not able to take Neurontin  and she was afraid of declining memory. I recommended alpha lipoic acid.  - return to clinic in 3-4 months  2. HL - Latest lipid panel was reviewed from 08/2023: LDL above target, otherwise fractions at goal No results found for: CHOL, HDL, LDLCALC, LDLDIRECT, TRIG, CHOLHDL -She is intolerant to statins but  she is taking ezetimibe 10 mg daily without side effects  3.  Hypothyroidism - latest thyroid  labs reviewed with pt. >> normal: Lab Results  Component Value Date   TSH 3.68 11/30/2023  - she continues on LT4 25 mcg daily - pt feels good on this dose. - we discussed about taking the thyroid  hormone every day, with water, >30 minutes before breakfast, separated by >4 hours from acid reflux medications, calcium, iron, multivitamins. Pt. is taking it correctly. - will check thyroid  tests at next visit  Lela Fendt, MD PhD [facility] Endocr[facility]

## 2024-07-06 NOTE — Patient Instructions (Addendum)
 Please continue: - Tresiba  10 units daily  Please try to take: - Humalog  6-10 units before ALL meals  Continue Levothyroxine 25 mcg daily.  Take the thyroid  hormone every day, with water, at least 30 minutes before breakfast, separated by at least 4 hours from: - acid reflux medications - calcium - iron - multivitamins  Can try the following combination for neuropathy: - alpha-lipoic acid 600 mg twice a day  Please return in 3-4 months.

## 2024-07-11 DIAGNOSIS — H52223 Regular astigmatism, bilateral: Secondary | ICD-10-CM | POA: Diagnosis not present

## 2024-07-11 LAB — HM DIABETES EYE EXAM

## 2024-07-18 ENCOUNTER — Other Ambulatory Visit: Payer: Self-pay | Admitting: Internal Medicine

## 2024-07-18 DIAGNOSIS — E119 Type 2 diabetes mellitus without complications: Secondary | ICD-10-CM

## 2024-07-18 DIAGNOSIS — E11319 Type 2 diabetes mellitus with unspecified diabetic retinopathy without macular edema: Secondary | ICD-10-CM

## 2024-08-01 ENCOUNTER — Ambulatory Visit: Admitting: Internal Medicine

## 2024-08-01 DIAGNOSIS — E1165 Type 2 diabetes mellitus with hyperglycemia: Secondary | ICD-10-CM | POA: Diagnosis not present

## 2024-08-06 DIAGNOSIS — E782 Mixed hyperlipidemia: Secondary | ICD-10-CM | POA: Diagnosis not present

## 2024-08-06 DIAGNOSIS — H401131 Primary open-angle glaucoma, bilateral, mild stage: Secondary | ICD-10-CM | POA: Diagnosis not present

## 2024-08-06 DIAGNOSIS — G300 Alzheimer's disease with early onset: Secondary | ICD-10-CM | POA: Diagnosis not present

## 2024-08-06 DIAGNOSIS — E1165 Type 2 diabetes mellitus with hyperglycemia: Secondary | ICD-10-CM | POA: Diagnosis not present

## 2024-08-14 ENCOUNTER — Other Ambulatory Visit: Payer: Self-pay | Admitting: Internal Medicine

## 2024-08-18 ENCOUNTER — Other Ambulatory Visit: Payer: Self-pay

## 2024-08-18 ENCOUNTER — Telehealth: Payer: Self-pay | Admitting: Dietician

## 2024-08-18 DIAGNOSIS — G629 Polyneuropathy, unspecified: Secondary | ICD-10-CM | POA: Diagnosis not present

## 2024-08-18 DIAGNOSIS — E11319 Type 2 diabetes mellitus with unspecified diabetic retinopathy without macular edema: Secondary | ICD-10-CM

## 2024-08-18 DIAGNOSIS — Z23 Encounter for immunization: Secondary | ICD-10-CM | POA: Diagnosis not present

## 2024-08-18 DIAGNOSIS — E538 Deficiency of other specified B group vitamins: Secondary | ICD-10-CM | POA: Diagnosis not present

## 2024-08-18 DIAGNOSIS — E782 Mixed hyperlipidemia: Secondary | ICD-10-CM | POA: Diagnosis not present

## 2024-08-18 DIAGNOSIS — E611 Iron deficiency: Secondary | ICD-10-CM | POA: Diagnosis not present

## 2024-08-18 DIAGNOSIS — E1165 Type 2 diabetes mellitus with hyperglycemia: Secondary | ICD-10-CM | POA: Diagnosis not present

## 2024-08-18 DIAGNOSIS — E113293 Type 2 diabetes mellitus with mild nonproliferative diabetic retinopathy without macular edema, bilateral: Secondary | ICD-10-CM | POA: Diagnosis not present

## 2024-08-18 LAB — LAB REPORT - SCANNED
Calcium: 9.1
EGFR: 55

## 2024-08-18 MED ORDER — TRESIBA FLEXTOUCH 100 UNIT/ML ~~LOC~~ SOPN: 10.0000 [IU] | PEN_INJECTOR | Freq: Every day | SUBCUTANEOUS | 1 refills | Status: AC

## 2024-08-18 NOTE — Telephone Encounter (Signed)
 Returned patient's husband's call. He went to the pharmacy and states that the prescription for the insulin  is wrong because it did not give him the correct number of pens to last 2 months.  Reviewed patient's insulin  orders per MD note 07/06/2024. She is to take 10 units Tresiba  daily and 6-10 units of Humalog  before ALL meals.  Called patient's pharmacy who stated that previous prescription was for 15 mL which supplied more pens. Sent message to CMA to change prescription.SABRA Leita Constable, RD, LDN, CDCES, DipACLM

## 2024-08-18 NOTE — Telephone Encounter (Signed)
 Medication has been sent via refill encounter. For 15 mL vs 9mL.  Rolin KRAFT

## 2024-08-28 DIAGNOSIS — Z0000 Encounter for general adult medical examination without abnormal findings: Secondary | ICD-10-CM | POA: Diagnosis not present

## 2024-08-28 DIAGNOSIS — I700 Atherosclerosis of aorta: Secondary | ICD-10-CM | POA: Diagnosis not present

## 2024-09-30 NOTE — Progress Notes (Incomplete)
 Neurology clinic note  SERVICE DATE: @DATE @   Reason for Evaluation: Paresthesias    HPI: This is Haley Bartlett, a 73 y.o. R-handed female  Spanish speaking, with a medical history of HTN, HLD, B12 deficiency,chronic pain syndrome, anxiety, DM2, MCI with concern for AD (failing to follow up since 07/2023, with MMSE 28/30 at the time), *** who presents to neurology clinic with the chief complaint of worsening paresthesias***. The patient is accompanied by ***.    When did the symptoms appear? The patient has not*** had similar episodes of symptoms in the past. ***  Muscle bulk loss? ***Denies Muscle pain? ***Denies Cramps/Twitching? ***Endorsed. Does strength improve after brief exercise?***  Able to brush hair/teeth without difficulty? *** Able to button shirts/use zips? *** Clumsiness/dropping grasped objects?***Denies Can you arise from squatted position easily? ***  Able to get out of chair without using arms? ***  Able to walk up steps easily? *** Use an assistive device to walk? ***  Significant imbalance with walking? ***  Falls?***    The patient denies***  diplopia, ptosis, dysphagia, poor saliva control, dysarthria/dysphonia, impaired mastication, facial weakness/droop.  Respiratory weakness?  Denies acute orthopnea>dyspnea.   Autonomic symptoms?  Denies impaired sweating, heat or cold intolerance, excessive mucosal dryness, gastroparetic early satiety, postprandial abdominal bloating, constipation, bowel or bladder dyscontrol, erectile dysfunction*** or syncope/presyncope/orthostatic intolerance.  Recent skin rashes? No  Any constitutional symptoms like fever, night sweats, anorexia or unintentional weight loss?SABRA  ETOH use?: ***  Restrictive diet? *** Family history of neuropathy/myopathy/NM disease?***    Any biopsy done? *** Current medications being tried for the patient's symptoms include  ***    Prior medications that have been tried:  ***   MEDICATIONS:  Outpatient Encounter Medications as of 10/04/2024  Medication Sig   Accu-Chek Softclix Lancets lancets Use as instructed 1x a day   donepezil  (ARICEPT ) 10 MG tablet TOME 1 TABLETA POR LA BOCA CADA  DIA   dorzolamide-timolol (COSOPT) 22.3-6.8 MG/ML ophthalmic solution Place 1 drop into both eyes 2 (two) times daily.   gabapentin  (NEURONTIN ) 300 MG capsule TOME 1 CAPSULA POR LA BOCA AL  ACOSTARSE   Glucagon  3 MG/DOSE POWD Place 3 mg into the nose once as needed for up to 1 dose.   glucose blood (ACCU-CHEK GUIDE) test strip 1 each by Other route 2 (two) times daily. And lancets 2/day   Injection Device for Insulin  (CEQUR SIMPLICITY INSERTER) MISC Use as advised   Insulin  Disposable Pump (OMNIPOD DASH PODS, GEN 4,) MISC USE AS INSTRUCTED TO ADMINISTER INSULIN , CHANGE EVERY 3 DAYS   insulin  lispro (HUMALOG ) 100 UNIT/ML injection Use up to 33 units a day in the insulin  pump as advised   insulin  lispro (HUMALOG ) 100 UNIT/ML KwikPen INJECT 6 TO 10 UNITS SUBCUTANEOUSLY THREE TIMES DAILY   Insulin  Pen Needle 32G X 4 MM MISC Use 4x a day   ipratropium (ATROVENT) 0.03 % nasal spray Place 2 sprays into both nostrils 2 (two) times daily.   latanoprost (XALATAN) 0.005 % ophthalmic solution Place 1 drop into both eyes at bedtime.   levothyroxine (SYNTHROID, LEVOTHROID) 25 MCG tablet Take 25 mcg by mouth daily before breakfast.   memantine  (NAMENDA ) 10 MG tablet TOME 1 TABLETA POR LA BOCA CADA  NOCHE POR 2 SEMANAS LUEGO 1  TABLETA POR LA BOCA DOS VECES AL DIA   predniSONE  (DELTASONE ) 10 MG tablet Take 1 tablet (10 mg total) by mouth daily.   repaglinide  (PRANDIN ) 2 MG tablet Take 2 mg  by mouth.   sertraline (ZOLOFT) 50 MG tablet Take 50 mg by mouth in the morning and at bedtime.   traZODone (DESYREL) 100 MG tablet Take 100 mg by mouth at bedtime.   TRESIBA  FLEXTOUCH 100 UNIT/ML FlexTouch Pen Inject 10 Units into the skin daily.   ZETIA 10 MG tablet 1 tablet Orally Once a day for 90 days    No facility-administered encounter medications on file as of 10/04/2024.    PAST MEDICAL HISTORY: Past Medical History:  Diagnosis Date   Depression     PAST SURGICAL HISTORY: Past Surgical History:  Procedure Laterality Date   ABDOMINAL HYSTERECTOMY     APPENDECTOMY     THYROIDECTOMY      ALLERGIES: Allergies  Allergen Reactions   Atorvastatin Other (See Comments)    Reaction??   Atorvastatin Calcium Dermatitis   Pravastatin     Other Reaction(s): muscle aches   Other Rash and Other (See Comments)    A wrinkle cream (name not recalled) broke out the chest and face    FAMILY HISTORY: Family History  Problem Relation Age of Onset   Dementia Sister    Diabetes Sister    Diabetes Mother    Diabetes Brother     SOCIAL HISTORY: Social History   Tobacco Use   Smoking status: Never   Smokeless tobacco: Never  Vaping Use   Vaping status: Never Used  Substance Use Topics   Alcohol use: No   Drug use: No   Social History   Social History Narrative   Lives with Haley Bartlett, Haley Bartlett   Caffeine use: Coffee daily   Right handed      OBJECTIVE: PHYSICAL EXAM: There were no vitals taken for this visit.  General:*** General appearance: Awake and alert. No distress. Cooperative with exam.  Skin: No obvious rash or jaundice. HEENT: Atraumatic. Anicteric. Lungs: Non-labored breathing on room air  Heart: Regular Abdomen: Soft, non tender. Extremities: No edema. No obvious deformity.  Musculoskeletal: No obvious joint swelling. Psych: Affect appropriate.  Neurological: Mental Status: Alert. Speech fluent. Cranial Nerves: CNII:Visual fields grossly intact. Fundus not visualized  CNIII, IV, VI: PERRL. No nystagmus. EOMI. CN V: Facial sensation intact bilaterally to fine touch.   CN VII: Facial muscles symmetric and strong. No ptosis  CN VIII: Hearing grossly intact bilaterally. CN IX: No hypophonia. CN X: Palate elevates symmetrically. CN XI: Full strength  shoulder shrug bilaterally. CN XII: Tongue protrusion full and midline. No atrophy or fasciculations. No significant dysarthria*** Motor: Tone is ***normal. *** No fasciculations in *** extremities. *** No atrophy. Toe extension      Reflexes: DTR 2/4 B . Negative Babinski    Sensation: Pinprick:  normal*** Vibration:  normal*** Temperature: normal*** Proprioception: normal*** Coordination: Intact finger-to- nose-finger bilaterally. Romberg negative.*** Gait: Able to rise from chair with arms crossed unassisted. Normal, narrow-based gait. Able to tandem walk. Able to heel-toe walk   ASSESSMENT: Haley Bartlett is a 73 y.o. female who presents for evaluation of paresthesias ***.    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     CC: Katina Pfeiffer, PA-C [address] [place] [id number]

## 2024-10-04 ENCOUNTER — Ambulatory Visit: Admitting: Physician Assistant

## 2024-11-01 NOTE — Progress Notes (Signed)
 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 Emotional issues such as sadness, anger, and resentment may affect memory. Current medications, donepezil  and memantine , are effective. Depression is exacerbated by memory and marital issues, leading to increased anger and mood changes. No current therapy or counseling in place.  Continue donepezil  10 mg daily and memantine  10 mg twice daily.  Recommended psychotherapy and psychiatry in Spanish to address emotional issues affecting memory.    Suggested couples therapy to address long-standing relationship issues. -Encouraged socialization and physical activities to improve mood and memory. Follow up in 6 months  Type 2 diabetes mellitus with stable diabetic neuropathy Diabetic neuropathy is well-controlled. No recent falls or head injuries. Neuropathy symptoms are reportedly stable. - Monitor blood glucose levels regularly. Continue gabapentin  300 mg daily as per PCP Recommend good control of cardiovascular risk factors.         Discussed the use of AI scribe software for clinical note transcription with the patient, who gave verbal consent to proceed.  History of Present Illness  Haley Bartlett, a 73 y.o. R-handed female  Spanish speaking, with a medical history of HTN, HLD, B12 deficiency,chronic pain syndrome, anxiety, DM2, MCI with concern for AD (failing to follow up since 07/2023, with MMSE 28/30 at the time) presenting today in follow-up for evaluation of memory loss. Patient is on donepezil  10 mg daily and memantine  10 mg bid . This patient is accompanied in the office by her husband who supplements the history. Previous records as well as any outside records available were reviewed prior to todays visit.   Patient noted decline over the past year. She is currently taking donepezil  10 mg daily and memantine  10  mg twice a day. Her husband reports that her memory is about the same as before, with some good and bad days. She sometimes forgets conversations and names, which may be influenced by her mood and blood sugar levels. No repetition of speech or disorientation, but there are instances where she does not recognize her car. Her memory issues seem to affect her mood, making her angrier towards him something bad that I did 30 years ago, and her depression has worsened recently.   She experiences sleep disturbances, which her husband attributes to his snoring. She has nightmares related to past problems, which may be linked to anxiety and depression. She does not have any issues with sleep when she moves to another bedroom.  Her husband assists with her medications and finances. She has not experienced significant appetite changes, but she is mindful of her diet due to her glucose sensor. No trouble swallowing and she continues to cook without forgetting recipes.  She experiences headaches but not severe enough to require hospital visits. She takes Tylenol for headaches as needed. No recent falls or head injuries and no difficulty walking.  She has a history of chronic neuropathy due to diabetes , takes gabapentin  300 mg for neuropathy.  Continues to drive but not as often, denies any issues      Past Medical History:  Diagnosis Date   Depression      Past Surgical History:  Procedure Laterality Date   ABDOMINAL HYSTERECTOMY     APPENDECTOMY     THYROIDECTOMY        Results DIAGNOSTIC Cognitive assessment: Score 21/30, stable memory performance     Objective:     PHYSICAL EXAMINATION:  VITALS:  There were no vitals filed for this visit.  GEN:  The patient appears stated age and is in NAD. HEENT:  Normocephalic, atraumatic.   Neurological examination:  General: NAD, well-groomed, appears stated age. Orientation: The patient is alert. Oriented to person, place and not to  date.  Cranial nerves: There is good facial symmetry. Flat affect.The speech is fluent and clear. No aphasia or dysarthria. Fund of knowledge is appropriate. Recent memory impaired and remote memory is normal.  Attention and concentration are reduced.  Able to name objects and repeat phrases 1/2.  Hearing is intact to conversational tone.   Delayed recall  3/5 Sensation: Sensation is intact to light touch throughout Motor: Strength is at least antigravity x4. DTR's 2/4 in UE/LE      11/06/2024   11:00 AM 06/30/2021   11:00 AM  Montreal Cognitive Assessment   Visuospatial/ Executive (0/5) 2 1  Naming (0/3) 1 1  Attention: Read list of digits (0/2) 2 0  Attention: Read list of letters (0/1) 1 0  Attention: Serial 7 subtraction starting at 100 (0/3) 2 1  Language: Repeat phrase (0/2) 1 0  Language : Fluency (0/1) 1 0  Abstraction (0/2) 2 1  Delayed Recall (0/5) 3 0  Orientation (0/6) 5 5  Total 20 9  Adjusted Score (based on education) 21 10       07/28/2023   12:00 PM 10/14/2022    2:00 PM 10/23/2016    9:11 AM  MMSE - Mini Mental State Exam  Orientation to time 4 5 4    Orientation to Place 5 5 3    Registration 3 3 3    Attention/ Calculation 5 4 5    Recall 2 2 3    Language- name 2 objects 2 2 2    Language- repeat 1 1 1   Language- follow 3 step command 3 3 3    Language- read & follow direction 1 1 1    Write a sentence 1 1 1    Copy design 1 1 1    Total score 28 28 27       Data saved with a previous flowsheet row definition          Movement examination: Tone: There is normal tone in the UE/LE Abnormal movements:  no tremor.  No myoclonus.  No asterixis.   Coordination:  There is no decremation with RAM's. Normal finger to nose  Gait and Station: The patient has no difficulty arising out of a deep-seated chair without the use of the hands. The patient's stride length is good.  Gait is cautious and narrow.   Thank you for allowing us  the opportunity to participate in the  care of this nice patient. Please do not hesitate to contact us  for any questions or concerns.   Total time spent on today's visit was 50 minutes dedicated to this patient today, preparing to see patient, examining the patient, ordering tests and/or medications and counseling the patient, documenting clinical information in the EHR or other health record, independently interpreting results and communicating results to the patient/family, discussing treatment and goals, answering patient's questions and coordinating care.  Cc:  Katina Pfeiffer, PA-C  Camie Sevin 11/06/2024 11:32 AM

## 2024-11-06 ENCOUNTER — Ambulatory Visit: Admitting: Physician Assistant

## 2024-11-06 DIAGNOSIS — G3184 Mild cognitive impairment, so stated: Secondary | ICD-10-CM | POA: Diagnosis not present

## 2024-11-06 MED ORDER — MEMANTINE HCL 10 MG PO TABS: ORAL_TABLET | ORAL | 3 refills | Status: AC

## 2024-11-06 MED ORDER — DONEPEZIL HCL 10 MG PO TABS: ORAL_TABLET | ORAL | 3 refills | Status: AC

## 2024-11-06 NOTE — Patient Instructions (Addendum)
 It was a pleasure to see you today at our office.   Recommendations:  Follow up a las 11:30 on June 3  Continue donepezil  10 mg daily  Continue Memantine  10 mg twice daily  Monitor the sugars  Recommend psychotherapy and psychiatry in Spanish for depression    Whom to call:  Memory  decline, memory medications: Call our office [phone]   For psychiatric meds, mood meds: Please have your primary care physician manage these medications.   Counseling regarding caregiver distress, including caregiver depression, anxiety and issues regarding community resources, adult day care programs, adult living facilities, or memory care questions:   Feel free to contact Misty Waddell Simmer, Social Worker at [phone]   For assessment of decision of mental capacity and competency:  Call Dr. Rosaline Nine, geriatric psychiatrist at [phone]        RECOMMENDATIONS FOR ALL PATIENTS WITH MEMORY PROBLEMS: 1. Continue to exercise (Recommend 30 minutes of walking everyday, or 3 hours every week) 2. Increase social interactions - continue going to [place] and enjoy social gatherings with friends and family 3. Eat healthy, avoid fried foods and eat more fruits and vegetables 4. Maintain adequate blood pressure, blood sugar, and blood cholesterol level. Reducing the risk of stroke and cardiovascular disease also helps promoting better memory. 5. Avoid stressful situations. Live a simple life and avoid aggravations. Organize your time and prepare for the next day in anticipation. 6. Sleep well, avoid any interruptions of sleep and avoid any distractions in the bedroom that may interfere with adequate sleep quality 7. Avoid sugar, avoid sweets as there is a strong link between excessive sugar intake, diabetes, and cognitive impairment We discussed the Mediterranean diet, which has been shown to help patients reduce the risk of progressive memory disorders and reduces cardiovascular risk. This  includes eating fish, eat fruits and green leafy vegetables, nuts like almonds and hazelnuts, walnuts, and also use olive oil. Avoid fast foods and fried foods as much as possible. Avoid sweets and sugar as sugar use has been linked to worsening of memory function.  There is always a concern of gradual progression of memory problems. If this is the case, then we may need to adjust level of care according to patient needs. Support, both to the patient and caregiver, should then be put into place.    The [employer] is here all day, every day for people facing Alzheimer's disease through our free 24/7 Helpline: [phone]. The Helpline provides reliable information and support to all those who need assistance, such as individuals living with memory loss, Alzheimer's or other dementia, caregivers, health care professionals and the public.  Our highly trained and knowledgeable staff can help you with: Understanding memory loss, dementia and Alzheimer's  Medications and other treatment options  General information about aging and brain health  Skills to provide quality care and to find the best care from professionals  Legal, financial and living-arrangement decisions Our Helpline also features: Confidential care consultation provided by master's level clinicians who can help with decision-making support, crisis assistance and education on issues families face every day  Help in a caller's preferred language using our translation service that features more than 200 languages and dialects  Referrals to local community programs, services and ongoing support     FALL PRECAUTIONS: Be cautious when walking. Scan the area for obstacles that may increase the risk of trips and falls. When getting up in the mornings, sit up at the edge of the bed for  a few minutes before getting out of bed. Consider elevating the bed at the head end to avoid drop of blood pressure when getting up. Walk always in  a well-lit room (use night lights in the walls). Avoid area rugs or power cords from appliances in the middle of the walkways. Use a walker or a cane if necessary and consider physical therapy for balance exercise. Get your eyesight checked regularly.  FINANCIAL OVERSIGHT: Supervision, especially oversight when making financial decisions or transactions is also recommended.  HOME SAFETY: Consider the safety of the kitchen when operating appliances like stoves, microwave oven, and blender. Consider having supervision and share cooking responsibilities until no longer able to participate in those. Accidents with firearms and other hazards in the house should be identified and addressed as well.   ABILITY TO BE LEFT ALONE: If patient is unable to contact 911 operator, consider using LifeLine, or when the need is there, arrange for someone to stay with patients. Smoking is a fire hazard, consider supervision or cessation. Risk of wandering should be assessed by caregiver and if detected at any point, supervision and safe proof recommendations should be instituted.  MEDICATION SUPERVISION: Inability to self-administer medication needs to be constantly addressed. Implement a mechanism to ensure safe administration of the medications.   DRIVING: Regarding driving, in patients with progressive memory problems, driving will be impaired. We advise to have someone else do the driving if trouble finding directions or if minor accidents are reported. Independent driving assessment is available to determine safety of driving.   If you are interested in the driving assessment, you can contact the following:  [facility] [employer] [facility] in [place] [phone]  [facility] [phone]  [facility] [phone] or [phone]      Mediterranean Diet A Mediterranean diet refers to food and lifestyle choices that are based on the traditions of  countries located on the [employer]. This way of eating has been shown to help prevent certain conditions and improve outcomes for people who have chronic diseases, like kidney disease and heart disease. What are tips for following this plan? Lifestyle  Cook and eat meals together with your family, when possible. Drink enough fluid to keep your urine clear or pale yellow. Be physically active every day. This includes: Aerobic exercise like running or swimming. Leisure activities like gardening, walking, or housework. Get 7-8 hours of sleep each night. If recommended by your health care provider, drink red wine in moderation. This means 1 glass a day for nonpregnant women and 2 glasses a day for men. A glass of wine equals 5 oz (150 mL). Reading food labels  Check the serving size of packaged foods. For foods such as rice and pasta, the serving size refers to the amount of cooked product, not dry. Check the total fat in packaged foods. Avoid foods that have saturated fat or trans fats. Check the ingredients list for added sugars, such as corn syrup. Shopping  At the grocery store, buy most of your food from the areas near the walls of the store. This includes: Fresh fruits and vegetables (produce). Grains, beans, nuts, and seeds. Some of these may be available in unpackaged forms or large amounts (in bulk). Fresh seafood. Poultry and eggs. Low-fat dairy products. Buy whole ingredients instead of prepackaged foods. Buy fresh fruits and vegetables in-season from local farmers markets. Buy frozen fruits and vegetables in resealable bags. If you do not have access to quality fresh seafood,  buy precooked frozen shrimp or canned fish, such as tuna, salmon, or sardines. Buy small amounts of raw or cooked vegetables, salads, or olives from the deli or salad bar at your store. Stock your pantry so you always have certain foods on hand, such as olive oil, canned tuna, canned tomatoes, rice,  pasta, and beans. Cooking  Cook foods with extra-virgin olive oil instead of using butter or other vegetable oils. Have meat as a side dish, and have vegetables or grains as your main dish. This means having meat in small portions or adding small amounts of meat to foods like pasta or stew. Use beans or vegetables instead of meat in common dishes like chili or lasagna. Experiment with different cooking methods. Try roasting or broiling vegetables instead of steaming or sauteing them. Add frozen vegetables to soups, stews, pasta, or rice. Add nuts or seeds for added healthy fat at each meal. You can add these to yogurt, salads, or vegetable dishes. Marinate fish or vegetables using olive oil, lemon juice, garlic, and fresh herbs. Meal planning  Plan to eat 1 vegetarian meal one day each week. Try to work up to 2 vegetarian meals, if possible. Eat seafood 2 or more times a week. Have healthy snacks readily available, such as: Vegetable sticks with hummus. Greek yogurt. Fruit and nut trail mix. Eat balanced meals throughout the week. This includes: Fruit: 2-3 servings a day Vegetables: 4-5 servings a day Low-fat dairy: 2 servings a day Fish, poultry, or lean meat: 1 serving a day Beans and legumes: 2 or more servings a week Nuts and seeds: 1-2 servings a day Whole grains: 6-8 servings a day Extra-virgin olive oil: 3-4 servings a day Limit red meat and sweets to only a few servings a month What are my food choices? Mediterranean diet Recommended Grains: Whole-grain pasta. Brown rice. Bulgar wheat. Polenta. Couscous. Whole-wheat bread. [place] Made[place]. Vegetables: Artichokes. Beets. Broccoli. Cabbage. Carrots. Eggplant. Green beans. Chard. Kale. Spinach. Onions. Leeks. Peas. Squash. Tomatoes. Peppers. Radishes. Fruits: Apples. Apricots. Avocado. Berries. Bananas. Cherries. Dates. Figs. Grapes. Lemons. Melon. Oranges. Peaches. Plums. Pomegranate. Meats and other protein foods: Beans.  Almonds. Sunflower seeds. Pine nuts. Peanuts. Cod. Salmon. Scallops. Shrimp. Tuna. Tilapia. Clams. Oysters. Eggs. Dairy: Low-fat milk. Cheese. Greek yogurt. Beverages: Water. Red wine. Herbal tea. Fats and oils: Extra virgin olive oil. Avocado oil. Grape seed oil. Sweets and desserts: Greek yogurt with honey. Baked apples. Poached pears. Trail mix. Seasoning and other foods: Basil. Cilantro. Coriander. Cumin. Mint. Parsley. Sage. Rosemary. Tarragon. Garlic. Oregano. Thyme. Pepper. Balsalmic vinegar. Tahini. Hummus. Tomato sauce. Olives. Mushrooms. Limit these Grains: Prepackaged pasta or rice dishes. Prepackaged cereal with added sugar. Vegetables: Deep fried potatoes (french fries). Fruits: Fruit canned in syrup. Meats and other protein foods: Beef. Pork. Lamb. Poultry with skin. Hot dogs. [place]. Dairy: Ice cream. Sour cream. Whole milk. Beverages: Juice. Sugar-sweetened soft drinks. Beer. Liquor and spirits. Fats and oils: Butter. Canola oil. Vegetable oil. Beef fat (tallow). Lard. Sweets and desserts: Cookies. Cakes. Pies. Candy. Seasoning and other foods: Mayonnaise. Premade sauces and marinades. The items listed may not be a complete list. Talk with your dietitian about what dietary choices are right for you. Summary The Mediterranean diet includes both food and lifestyle choices. Eat a variety of fresh fruits and vegetables, beans, nuts, seeds, and whole grains. Limit the amount of red meat and sweets that you eat. Talk with your health care provider about whether it is safe for you to drink red wine in moderation.  This means 1 glass a day for nonpregnant women and 2 glasses a day for men. A glass of wine equals 5 oz (150 mL). This information is not intended to replace advice given to you by your health care provider. Make sure you discuss any questions you have with your health care provider. Document Released: 07/16/2016 Document Revised: 08/18/2016 Document Reviewed:  07/16/2016 Elsevier Interactive Patient Education  2017 [employer].

## 2024-11-07 ENCOUNTER — Ambulatory Visit: Admitting: Internal Medicine

## 2024-11-07 ENCOUNTER — Encounter: Payer: Self-pay | Admitting: Internal Medicine

## 2024-11-07 VITALS — BP 112/60 | HR 81 | Resp 18 | Ht 59.0 in | Wt 98.0 lb

## 2024-11-07 DIAGNOSIS — E11319 Type 2 diabetes mellitus with unspecified diabetic retinopathy without macular edema: Secondary | ICD-10-CM | POA: Diagnosis not present

## 2024-11-07 DIAGNOSIS — Z794 Long term (current) use of insulin: Secondary | ICD-10-CM

## 2024-11-07 DIAGNOSIS — E039 Hypothyroidism, unspecified: Secondary | ICD-10-CM

## 2024-11-07 DIAGNOSIS — E785 Hyperlipidemia, unspecified: Secondary | ICD-10-CM

## 2024-11-07 LAB — POCT GLYCOSYLATED HEMOGLOBIN (HGB A1C): Hemoglobin A1C: 7.8 % — AB (ref 4.0–5.6)

## 2024-11-07 NOTE — Progress Notes (Signed)
 Patient ID: Haley Bartlett, female   DOB: Aug 13, 1951, 73 y.o.   MRN: [id number]  HPI: Haley Bartlett is a 73 y.o.-year-old female, returning for follow-up for DM2, dx in 1993, on insulin  since 2002, uncontrolled, with complications (DR).  She previously saw Dr. Kassie, last visit with me 4 months ago. She is accompanied by her husband who translates for us  and offers mostof the history.   Interim history: No increased urination, nausea, chest pain.  She has some blurry vision.  Reviewed HbA1c: 05/31/2024: HbA1c 6.9% Lab Results  Component Value Date   HGBA1C 6.9 (A) 04/03/2024   HGBA1C 6.6 (A) 01/21/2024   HGBA1C 8.3 (A) 08/06/2023   HGBA1C 7.9% 01/15/2023   HGBA1C 8.6 (A) 04/17/2022   HGBA1C 8.5 (A) 01/08/2022   HGBA1C 8.2 (A) 09/30/2021   HGBA1C 7.8 (A) 08/26/2021   HGBA1C 6.9 (A) 04/22/2021   HGBA1C 6.9 (A) 02/18/2021  11/30/2023: HbA1c 7.4% 01/15/2023: HbA1c 7.9% 07/30/2022: HbA1c 7.7% 03/26/2022: HbA1c 9.0%, C-peptide 1.6, glucose 217 08/13/2010: GAD antibody <1, Glu 164, C peptide 1.1  She was previously on: - Metformin  500 mg 2x a day - Prandin  1 mg 3 times a day before meals - Actos  30 mg before breakfast - Farxiga  10 mg before breakfast - Humalog  11 units before the 3 meals  - only if sugars >300 She was taken off insulin  but restarted 01/2022. Januvia  and Rybelsus  were too expensive.  At last visit she was not taking the recommended regimen: - Metformin  500 mg 2x a day >> not taken since last OV 2/2 UTIs - Tresiba  10 >> she decreased to 6 units daily 2/2 lows - Humalog  4-5  units 15 min before each meal and increase the dose as needed >> 6 units after dinner  She was previously confused about her regimen and was using different doses than recommended: - Tresiba  6 units daily >> 10-12 >> 10 units daily >> but using only 5 units of Tresiba  - Humalog  7-8 units 3-4x a day >> 10 min before EACH MEAL 9 units before b'fast >> but still using 8 units 4 units  before lunch >> still not injecting 4-5 units before dinner >> but still using 10 units  I then  recommended the following regimen: - Tresiba  10 units daily - Humalog  15 min before EACH MEAL 9 units before b'fast 4 units before lunch 4-5 units before dinner  At last visit she was on: - Tresiba  8 >> 10 units daily - Humalog  8 units before D - started 3 weeks prior to our appointment from 03/2024 >>  >> still using 4-5 units before dinner only >> again strongly recommended to take 6 to 10 units before each meal >> still taking only 4 units   She previously came off Prandin  reportedly due to UTIs.  CeQur insulin  pump was approved, however, this was very expensive: $1500 for 3 months.  Pt checks her sugars >4x a day and they are -with receiver:  Previously:  Previously:  Lowest sugar was 53 >> 60s; she has hypoglycemia awareness at 70.  Highest sugar was 400s >> 390 >> HI. She was not emergency room with hypoglycemia (09/2022).  She was found at home on the floor, by her daughter. She took her insulin , but did not eat lunch, which is unusual for her.  Glucose was 26 on Dexcom.  She did not hear the alarm.  It was noted that she was using higher doses of insulin  than recommended around the time  of the episode.  Glucometer: Accu-Chek guide  Meals: - b'fast: veggies + turkey sausage x2 - lunch: orange - dinner: chicken soup, meat, veggies  - last BUN/creatinine:  08/18/2024: 17/1.06, GFR 55, glucose 195 Lab Results  Component Value Date   BUN 26 (H) 05/30/2024   BUN 12 09/24/2022   CREATININE 1.06 (H) 05/30/2024   CREATININE 0.83 09/24/2022   Lab Results  Component Value Date   MICRALBCREAT 3 12/09/2023  She is not on ACE inhibitor/ARB.  -+ HL; last set of lipids: 08/18/2024: 187/85/90/82 09/06/2023: 230/80/84/133 Lipid Panel[[id number]]     Collected: 03/26/2022 04:20 PM     Specimen Received: 03/26/2022 05:00 AM       Cholesterol, Total [001065] 247 mg/dL H 899-800 mg/dL   Triglycerides [998827] 85 mg/dL Normal 9-850 mg/dL  HDL Cholesterol [988182] 104 mg/dL Normal >60 mg/dL  VLDL Cholesterol Cal [988080] 14 mg/dL Normal 4-59 mg/dL  LDL Chol Calc (NIH) [987940] 129 mg/dL H 9-00 mg/dL  No results found for: CHOL, HDL, LDLCALC, LDLDIRECT, TRIG, CHOLHDL She is intolerant to statins. On Zetia 10 mg daily.  - last eye exam was 07/11/2024: + DR.  - + numbness and tingling in her feet.  Last foot exam 01/21/2024.  She was rec'd Neurontin  100 mg at bedtime (prescribed by podiatry)- stopped 2/2 memory pbs.  She also has hypothyroidism - on levothyroxine 25 mcg daily, taken: - in am - fasting - at least 1h from b'fast - no calcium - no iron - + multivitamins prev.  at lunchtime, then off, but now she added them back 1 hour after LT4 - no PPIs - not on Biotin  Reviewed TSH levels: Lab Results  Component Value Date   TSH 3.68 11/30/2023   TSH 1.615 09/24/2022  03/17/2022: TSH 2.0  ROS: + See HPI  Past Medical History:  Diagnosis Date   Depression    Past Surgical History:  Procedure Laterality Date   ABDOMINAL HYSTERECTOMY     APPENDECTOMY     THYROIDECTOMY     Social History   Socioeconomic History   Marital status: Married    Spouse name: Geographical Information Systems Officer   Number of children: 2   Years of education: 12   Highest education level: Not on file  Occupational History   Occupation: retired  Tobacco Use   Smoking status: Never   Smokeless tobacco: Never  Vaping Use   Vaping status: Never Used  Substance and Sexual Activity   Alcohol use: No   Drug use: No   Sexual activity: Not on file  Other Topics Concern   Not on file  Social History Narrative   Lives with Fredderick, husband   Caffeine use: Coffee daily   Right handed    Social Drivers of Corporate Investment Banker Strain: Not on file  Food Insecurity: Not on file  Transportation Needs: Not on file  Physical Activity: Not on file  Stress: Not on file  Social Connections: Not  on file  Intimate Partner Violence: Not on file   Current Outpatient Medications on File Prior to Visit  Medication Sig Dispense Refill   Accu-Chek Softclix Lancets lancets Use as instructed 1x a day 100 each 3   donepezil  (ARICEPT ) 10 MG tablet TOME 1 TABLETA POR LA BOCA CADA  DIA 100 tablet 3   dorzolamide-timolol (COSOPT) 22.3-6.8 MG/ML ophthalmic solution Place 1 drop into both eyes 2 (two) times daily.     gabapentin  (NEURONTIN ) 300 MG capsule TOME 1 CAPSULA POR LA BOCA  AL  ACOSTARSE 100 capsule 2   Glucagon  3 MG/DOSE POWD Place 3 mg into the nose once as needed for up to 1 dose. 2 each 11   glucose blood (ACCU-CHEK GUIDE) test strip 1 each by Other route 2 (two) times daily. And lancets 2/day 200 each 3   Injection Device for Insulin  (CEQUR SIMPLICITY INSERTER) MISC Use as advised 1 each 0   Insulin  Disposable Pump (OMNIPOD DASH PODS, GEN 4,) MISC USE AS INSTRUCTED TO ADMINISTER INSULIN , CHANGE EVERY 3 DAYS 10 each 11   insulin  lispro (HUMALOG ) 100 UNIT/ML injection Use up to 33 units a day in the insulin  pump as advised 30 mL 3   insulin  lispro (HUMALOG ) 100 UNIT/ML KwikPen INJECT 6 TO 10 UNITS SUBCUTANEOUSLY THREE TIMES DAILY 15 mL 3   Insulin  Pen Needle 32G X 4 MM MISC Use 4x a day 300 each 3   ipratropium (ATROVENT) 0.03 % nasal spray Place 2 sprays into both nostrils 2 (two) times daily.     latanoprost (XALATAN) 0.005 % ophthalmic solution Place 1 drop into both eyes at bedtime.     levothyroxine (SYNTHROID, LEVOTHROID) 25 MCG tablet Take 25 mcg by mouth daily before breakfast.     memantine  (NAMENDA ) 10 MG tablet TOME 1 TABLETA DOS VECES AL DIA 180 tablet 3   predniSONE  (DELTASONE ) 10 MG tablet Take 1 tablet (10 mg total) by mouth daily. 3 tablet 0   repaglinide  (PRANDIN ) 2 MG tablet Take 2 mg by mouth.     sertraline (ZOLOFT) 50 MG tablet Take 50 mg by mouth in the morning and at bedtime.     traZODone (DESYREL) 100 MG tablet Take 100 mg by mouth at bedtime.     TRESIBA   FLEXTOUCH 100 UNIT/ML FlexTouch Pen Inject 10 Units into the skin daily. 15 mL 1   ZETIA 10 MG tablet 1 tablet Orally Once a day for 90 days     No current facility-administered medications on file prior to visit.   Allergies  Allergen Reactions   Atorvastatin Other (See Comments)    Reaction??   Atorvastatin Calcium Dermatitis   Pravastatin     Other Reaction(s): muscle aches   Other Rash and Other (See Comments)    A wrinkle cream (name not recalled) broke out the chest and face   Family History  Problem Relation Age of Onset   Dementia Sister    Diabetes Sister    Diabetes Mother    Diabetes Brother    PE: There were no vitals taken for this visit. Wt Readings from Last 3 Encounters:  07/06/24 99 lb (44.9 kg)  04/03/24 94 lb 12.8 oz (43 kg)  01/21/24 99 lb (44.9 kg)   Constitutional: thin, in NAD Eyes: EOMI, no exophthalmos ENT: no thyromegaly, no cervical lymphadenopathy Cardiovascular: RRR, No MRG Respiratory: CTA B Musculoskeletal: no deformities Skin: warm, no rashes Neurological: no tremor with outstretched hands  ASSESSMENT: 1. DM2, insulin -dependent, uncontrolled, with complications - DR  Component     Latest Ref Rng 08/20/2022  Glucose     65 - 99 mg/dL 729 (H)   Islet Cell Ab     Neg:<1:1  Negative   C-Peptide     0.80 - 3.85 ng/mL 1.04   ZNT8 Antibodies     <15 U/mL <10   Glutamic Acid Decarb Ab     <5 IU/mL <5   Glucose is too high to thoroughly interpret the C-peptide, however, it is encouraging that this is not suppressed.  The antipancreatic antibodies are not elevated.  2. HL  3.  Hypothyroidism  PLAN:  1. Patient with longstanding, uncontrolled, type 2 diabetes, on basal insulin  and meglitinide previously, but now back on bolus insulin , but with medication noncompliance. -at last visit we discussed about trying to take Humalog  before each meal, guiding the dose based on the size of the meal.  At that time, sugars were quite high,  increasing particularly after lunch and then again after dinner but remaining elevated mostly throughout the day as she was still not using Humalog  before every meal, only before dinner.  She was using lower doses than recommended when she was actually taking it.  As mentioned above, I recommended to take this before each meal and to increase the dose.  HbA1c was 6.9% much lower than expected from her CGM - I previously recommended the CeQur simplicity insulin  pump.  This was initially declined by her insurance and the V-Go pump was suggested.  We appealed the decision since she was on a low-dose of basal insulin  which did not qualify her for V-Go use.  The appeal was approved.  However, the pump turned out to be very expensive, $500 a month, so she was not able to start it - We checked her for insulin  deficiency and antipancreatic autoimmunity and the investigation was negative. CGM interpretation: -At today's visit, we reviewed her CGM downloads: It appears that 33% of values are in target range (goal >70%), while 67% are higher than 180 (goal <25%), and 0% are lower than 70 (goal <4%).  The calculated average blood sugar is 217.  The projected HbA1c for the next 3 months (GMI) is 8.5%. -Reviewing the CGM trends, sugars appear to be even higher than before.  Upon questioning, she is still not taking the recommended doses of Humalog , still taking only 4 units twice a day before meals, instead of increasing the dose to 6-10 units as advised.  As a consequence, sugars increase after meals in a stepwise fashion and slowly come down overnight.  I again discussed with both patient and her husband that she needs to guide the dose of Humalog  not only on the size of the meal but also on the sugars that she sees after meals.  Given different examples of meals and how much insulin  she needs to take for them, but I did advise her that this is just a starting point and they can continue to adjust based on CGM patterns.   Will continue the same dose of Tresiba  for now. - I suggested to:  Patient Instructions  Please continue: - Tresiba  10 units daily  Increase: - Humalog  6-10 units before ALL meals  Continue Levothyroxine 25 mcg daily.  Take the thyroid  hormone every day, with water, at least 30 minutes before breakfast, separated by at least 4 hours from: - acid reflux medications - calcium - iron - multivitamins  Take the multivitamin >4h after levothyroxine.  Come back for labs in  months after moving the multivitamins.  Please return in 3-4 months.  - we checked her HbA1c: 7.8% (higher) - advised to check sugars at different times of the day - 4x a day, rotating check times - advised for yearly eye exams >> she is UTD -She complains of muscle cramps in her feet.  She also mentions she has neuropathy.  She saw podiatry.  She was not able to take Neurontin  and she was afraid of declining memory. I recommended alpha lipoic acid.  - return  to clinic in 3-4 months  2. HL - Latest lipid panel from 08/18/2024 was reviewed: 187/85/90/82-LDL above target of less than 70, otherwise fractions to No results found for: CHOL, HDL, LDLCALC, LDLDIRECT, TRIG, CHOLHDL -S she is intolerant to statins but takes ezetimibe 10 mg daily without side effects.  3.  Hypothyroidism - latest thyroid  labs reviewed with pt. >> normal: Lab Results  Component Value Date   TSH 3.68 11/30/2023  - she continues on LT4 25 mcg daily - pt feels good on this dose. - we discussed about taking the thyroid  hormone every day, with water, >30 minutes before breakfast, separated by >4 hours from acid reflux medications, calcium, iron, multivitamins. Pt. was taking it correctly but since last visit she added multivitamins 1 hour after LT4.  We discussed about moving this later in the day. - will check thyroid  tests in 1.5 months from now, after she moves multivitamins at least 4 hours after LT4  Ramere Downs, MD  PhD [facility]

## 2024-11-07 NOTE — Patient Instructions (Addendum)
 Please continue: - Tresiba  10 units daily  Increase: - Humalog  6-10 units before ALL meals  Continue Levothyroxine 25 mcg daily.  Take the thyroid  hormone every day, with water, at least 30 minutes before breakfast, separated by at least 4 hours from: - acid reflux medications - calcium - iron - multivitamins  Take the multivitamin >4h after levothyroxine.  Come back for labs in  months after moving the multivitamins.  Please return in 3-4 months.

## 2024-11-22 ENCOUNTER — Other Ambulatory Visit (HOSPITAL_COMMUNITY): Payer: Self-pay | Admitting: Family Medicine

## 2024-11-22 DIAGNOSIS — R519 Headache, unspecified: Secondary | ICD-10-CM

## 2024-11-29 ENCOUNTER — Ambulatory Visit (HOSPITAL_COMMUNITY)
Admission: RE | Admit: 2024-11-29 | Discharge: 2024-11-29 | Disposition: A | Discharge status: Home / Self Care | Source: Ambulatory Visit | Attending: Family Medicine | Admitting: Family Medicine

## 2024-11-29 DIAGNOSIS — R519 Headache, unspecified: Secondary | ICD-10-CM | POA: Insufficient documentation

## 2024-11-29 MED ORDER — GADOBUTROL 1 MMOL/ML IV SOLN
4.5000 mL | Freq: Once | INTRAVENOUS | Status: AC | PRN
  Administered 2024-11-29: 4.5 mL via INTRAVENOUS

## 2024-12-11 ENCOUNTER — Ambulatory Visit: Admitting: Internal Medicine

## 2024-12-11 ENCOUNTER — Other Ambulatory Visit: Payer: Self-pay

## 2024-12-11 ENCOUNTER — Encounter: Payer: Self-pay | Admitting: Internal Medicine

## 2024-12-11 VITALS — BP 118/60 | HR 71 | Temp 97.8°F | Ht 58.5 in | Wt 100.1 lb

## 2024-12-11 DIAGNOSIS — J310 Chronic rhinitis: Secondary | ICD-10-CM | POA: Diagnosis not present

## 2024-12-11 DIAGNOSIS — L282 Other prurigo: Secondary | ICD-10-CM | POA: Diagnosis not present

## 2024-12-11 MED ORDER — TRIAMCINOLONE ACETONIDE 0.1 % EX OINT: TOPICAL_OINTMENT | CUTANEOUS | 1 refills | Status: AC

## 2024-12-11 MED ORDER — IPRATROPIUM BROMIDE 0.03 % NA SOLN: 2.0000 | Freq: Three times a day (TID) | NASAL | 5 refills | Status: AC | PRN

## 2024-12-11 NOTE — Patient Instructions (Addendum)
 Rash Occurs after sleeping in a particular bed always in morning. Does not happen to husband. Rash is papular. Highest on differential is bed bugs. Consider contact dermatitis - schedule patch testing - if bed bugs found, cancel patch testing and treat home for bed bugs - okay to use topical triamcinolone  on itchy spots twice daily   Chronic rhinitis Happens with cold and hot temperatures. Consistent with vasomotor rhinitis. Patient concerned with allergies and would like allergy testing. - schedule environmental allergy testing for Monday at 1:30 PM -okay to use atrovent  nasal spray 1 spray twice daily as needed to prevent runny nose  /Follow up : next Monday at 1:30 PM, 1-55.  Then Patch testing It was a pleasure meeting you in clinic today! Thank you for allowing me to participate in your care.  Rocky Endow, MD [facility]

## 2024-12-11 NOTE — Progress Notes (Signed)
 "  NEW PATIENT Date of Service/Encounter:   12/11/2024 Referring provider: none-self referred Primary care provider: Katina Pfeiffer, PA-C  Subjective:  Haley Bartlett is a 74 y.o. female with a PMHx of DM2, hyperlipidemia, acquired hypothyroidism, HTN presenting today for evaluation of rash. History obtained from: chart review and patient.   Discussed the use of AI scribe software for clinical note transcription with the patient, who gave verbal consent to proceed.  Pruritic skin rash - Persistent for three months - Characterized by itching and welts - Primarily affects the back and legs - Symptoms appear in the morning - Changing the mattress did not improve symptoms - Rash persists for more than a week at a time - Requires application of creams for partial relief, but remains unsightly (using coco cream) - No involvement of arms or interdigital spaces of the toes or fingers - occurs when sleeping in a certain bed  Chronic nasal symptoms - Present for over five years - Excessive nasal discharge triggered by hot beverages and temperature changes - Symptoms occur year-round, in both summer and winter - Exacerbated in crowded places, requiring frequent use of tissues - No recent use of medications for nasal symptoms    Past Medical History: Past Medical History:  Diagnosis Date   Depression    Medication List:  Current Outpatient Medications  Medication Sig Dispense Refill   Accu-Chek Softclix Lancets lancets Use as instructed 1x a day 100 each 3   Continuous Glucose Sensor (DEXCOM G7 SENSOR) MISC See admin instructions.     donepezil  (ARICEPT ) 10 MG tablet TOME 1 TABLETA POR LA BOCA CADA  DIA 100 tablet 3   dorzolamide (TRUSOPT) 2 % ophthalmic solution Place 1 drop into both eyes 2 (two) times daily.     dorzolamide-timolol (COSOPT) 22.3-6.8 MG/ML ophthalmic solution Place 1 drop into both eyes 2 (two) times daily.     gabapentin  (NEURONTIN ) 300 MG capsule TOME 1  CAPSULA POR LA BOCA AL  ACOSTARSE 100 capsule 2   Glucagon  3 MG/DOSE POWD Place 3 mg into the nose once as needed for up to 1 dose. 2 each 11   glucose blood (ACCU-CHEK GUIDE) test strip 1 each by Other route 2 (two) times daily. And lancets 2/day 200 each 3   Injection Device for Insulin  (CEQUR SIMPLICITY INSERTER) MISC Use as advised 1 each 0   Insulin  Disposable Pump (OMNIPOD DASH PODS, GEN 4,) MISC USE AS INSTRUCTED TO ADMINISTER INSULIN , CHANGE EVERY 3 DAYS 10 each 11   insulin  lispro (HUMALOG ) 100 UNIT/ML injection Use up to 33 units a day in the insulin  pump as advised 30 mL 3   insulin  lispro (HUMALOG ) 100 UNIT/ML KwikPen INJECT 6 TO 10 UNITS SUBCUTANEOUSLY THREE TIMES DAILY 15 mL 3   Insulin  Pen Needle 32G X 4 MM MISC Use 4x a day 300 each 3   latanoprost (XALATAN) 0.005 % ophthalmic solution Place 1 drop into both eyes at bedtime.     levothyroxine (SYNTHROID, LEVOTHROID) 25 MCG tablet Take 25 mcg by mouth daily before breakfast.     memantine  (NAMENDA ) 10 MG tablet TOME 1 TABLETA DOS VECES AL DIA 180 tablet 3   predniSONE  (DELTASONE ) 10 MG tablet Take 1 tablet (10 mg total) by mouth daily. 3 tablet 0   repaglinide  (PRANDIN ) 2 MG tablet Take 2 mg by mouth.     sertraline (ZOLOFT) 50 MG tablet Take 50 mg by mouth in the morning and at bedtime.     traZODone (DESYREL)  100 MG tablet Take 100 mg by mouth at bedtime.     TRESIBA  FLEXTOUCH 100 UNIT/ML FlexTouch Pen Inject 10 Units into the skin daily. 15 mL 1   triamcinolone  ointment (KENALOG ) 0.1 % Apply topically twice daily to BODY as needed for red, sandpaper like rash.  Do not use on face, groin or armpits. 80 g 1   ZETIA 10 MG tablet 1 tablet Orally Once a day for 90 days     ipratropium (ATROVENT ) 0.03 % nasal spray Place 2 sprays into both nostrils 3 (three) times daily as needed for rhinitis. 30 mL 5   No current facility-administered medications for this visit.   Known Allergies:  Allergies[1] Past Surgical History: Past  Surgical History:  Procedure Laterality Date   ABDOMINAL HYSTERECTOMY     APPENDECTOMY     THYROIDECTOMY     Family History: Family History  Problem Relation Age of Onset   Dementia Sister    Diabetes Sister    Diabetes Mother    Diabetes Brother    Social History: Kaydin lives in a house built 12 years ago, no water damage, carpet in the bedroom, central AC, no DM covers, disabled, originally from [place], husband from [place].   ROS:  All other systems negative except as noted per HPI.  Objective:  Blood pressure 118/60, pulse 71, temperature 97.8 F (36.6 C), temperature source Temporal, height 4' 10.5 (1.486 m), weight 100 lb 1.6 oz (45.4 kg), SpO2 99%. Body mass index is 20.56 kg/m. Physical Exam:  General Appearance:  Alert, cooperative, no distress, appears stated age  Head:  Normocephalic, without obvious abnormality, atraumatic  Eyes:  Conjunctiva clear, EOM's intact  Ears EACs normal bilaterally and normal TMs bilaterally  Nose: Nares normal, normal mucosa and no visible anterior polyps  Throat: Lips, tongue normal; teeth and gums normal, normal posterior oropharynx  Neck: Supple, symmetrical  Lungs:   clear to auscultation bilaterally, Respirations unlabored, no coughing  Heart:  regular rate and rhythm and no murmur, Appears well perfused  Extremities: No edema  Skin: Few scattered erythematous papules on back, upper chest with overlying excoriations, multiple similar lesions on bilateral lower extremities with excoriations, no oozing or streaking  Neurologic: No gross deficits   Diagnostics:  Labs:  Lab Orders  No laboratory test(s) ordered today     Assessment and Plan  Assessment and Plan Assessment & Plan  Rash Occurs after sleeping in a particular bed always in morning. Does not happen to husband. Rash is papular. Highest on differential is bed bugs. Consider contact dermatitis - schedule patch testing - if bed bugs found, cancel patch  testing and treat home for bed bugs - okay to use topical triamcinolone  on itchy spots twice daily   Chronic rhinitis Happens with cold and hot temperatures. Consistent with vasomotor rhinitis. Patient concerned with allergies and would like allergy testing. - schedule environmental allergy testing for Monday at 1:30 PM -okay to use atrovent  nasal spray 1 spray twice daily as needed to prevent runny nose  Follow up : next Monday at 1:30 PM, 1-55.  Then Patch testing It was a pleasure meeting you in clinic today! Thank you for allowing me to participate in your care.  Rocky Endow, MD [facility]   This note in its entirety was forwarded to the Provider who requested this consultation.  Other: none  Thank you for your kind referral. I appreciate the opportunity to take part in Arriah's care.  Please do not hesitate to contact me with questions.  Sincerely,  Rocky Endow, MD [facility]          [1]  Allergies Allergen Reactions   Atorvastatin Other (See Comments)    Reaction??   Atorvastatin Calcium Dermatitis   Pravastatin     Other Reaction(s): muscle aches   Other Rash and Other (See Comments)    A wrinkle cream (name not recalled) broke out the chest and face   "

## 2024-12-18 ENCOUNTER — Ambulatory Visit: Admitting: Internal Medicine

## 2025-01-08 ENCOUNTER — Encounter: Admitting: Family

## 2025-01-09 ENCOUNTER — Other Ambulatory Visit

## 2025-01-10 ENCOUNTER — Ambulatory Visit: Payer: Self-pay | Admitting: Internal Medicine

## 2025-01-10 ENCOUNTER — Encounter: Admitting: Allergy

## 2025-01-10 LAB — MICROALBUMIN / CREATININE URINE RATIO
Creatinine, Urine: 140 mg/dL (ref 20–275)
Microalb Creat Ratio: 9 mg/g{creat}
Microalb, Ur: 1.2 mg/dL

## 2025-01-10 LAB — TSH: TSH: 2.63 m[IU]/L (ref 0.40–4.50)

## 2025-01-10 LAB — T4, FREE: Free T4: 1.2 ng/dL (ref 0.8–1.8)

## 2025-01-12 ENCOUNTER — Encounter: Admitting: Internal Medicine

## 2025-01-16 ENCOUNTER — Ambulatory Visit: Admitting: Physician Assistant

## 2025-04-03 ENCOUNTER — Ambulatory Visit: Admitting: Internal Medicine

## 2025-05-09 ENCOUNTER — Ambulatory Visit: Admitting: Physician Assistant
